# Patient Record
Sex: Female | Born: 1975 | Race: Black or African American | Hispanic: No | Marital: Single | Smoking: Never smoker
Health system: Southern US, Community
[De-identification: ages and names within clinical notes are randomized; demographics above are authoritative.]

## PROBLEM LIST (undated history)

## (undated) DIAGNOSIS — E119 Type 2 diabetes mellitus without complications: Secondary | ICD-10-CM

## (undated) DIAGNOSIS — F419 Anxiety disorder, unspecified: Secondary | ICD-10-CM

## (undated) DIAGNOSIS — G4700 Insomnia, unspecified: Secondary | ICD-10-CM

## (undated) DIAGNOSIS — G43909 Migraine, unspecified, not intractable, without status migrainosus: Secondary | ICD-10-CM

## (undated) DIAGNOSIS — F32A Depression, unspecified: Secondary | ICD-10-CM

## (undated) DIAGNOSIS — K219 Gastro-esophageal reflux disease without esophagitis: Secondary | ICD-10-CM

## (undated) DIAGNOSIS — R569 Unspecified convulsions: Secondary | ICD-10-CM

## (undated) DIAGNOSIS — E785 Hyperlipidemia, unspecified: Secondary | ICD-10-CM

## (undated) HISTORY — DX: Depression, unspecified: F32.A

## (undated) HISTORY — DX: Migraine, unspecified, not intractable, without status migrainosus: G43.909

## (undated) HISTORY — DX: Anxiety disorder, unspecified: F41.9

## (undated) HISTORY — DX: Unspecified convulsions: R56.9

## (undated) HISTORY — PX: BREAST SURGERY: SHX581

## (undated) HISTORY — DX: Hyperlipidemia, unspecified: E78.5

## (undated) HISTORY — DX: Insomnia, unspecified: G47.00

## (undated) HISTORY — DX: Gastro-esophageal reflux disease without esophagitis: K21.9

## (undated) HISTORY — PX: BREAST BIOPSY: SHX20

---

## 2004-08-15 ENCOUNTER — Emergency Department: Payer: Self-pay | Admitting: Emergency Medicine

## 2005-07-06 ENCOUNTER — Emergency Department: Payer: Self-pay | Admitting: Emergency Medicine

## 2006-05-20 ENCOUNTER — Emergency Department: Payer: Self-pay | Admitting: Emergency Medicine

## 2006-06-24 ENCOUNTER — Emergency Department: Payer: Self-pay | Admitting: General Practice

## 2006-09-08 ENCOUNTER — Emergency Department: Payer: Self-pay | Admitting: Emergency Medicine

## 2007-01-17 ENCOUNTER — Emergency Department: Payer: Self-pay | Admitting: Emergency Medicine

## 2007-01-17 ENCOUNTER — Other Ambulatory Visit: Payer: Self-pay

## 2007-02-21 ENCOUNTER — Emergency Department: Payer: Self-pay | Admitting: Emergency Medicine

## 2007-12-15 ENCOUNTER — Emergency Department: Payer: Self-pay | Admitting: Emergency Medicine

## 2008-10-08 ENCOUNTER — Emergency Department: Payer: Self-pay | Admitting: Emergency Medicine

## 2009-04-28 ENCOUNTER — Emergency Department: Payer: Self-pay | Admitting: Emergency Medicine

## 2009-08-18 ENCOUNTER — Emergency Department: Payer: Self-pay | Admitting: Unknown Physician Specialty

## 2010-10-13 ENCOUNTER — Emergency Department: Payer: Self-pay | Admitting: Emergency Medicine

## 2011-09-09 ENCOUNTER — Emergency Department: Payer: Self-pay | Admitting: Emergency Medicine

## 2011-09-13 ENCOUNTER — Emergency Department: Payer: Self-pay | Admitting: *Deleted

## 2011-09-13 LAB — CBC WITH DIFFERENTIAL/PLATELET
Basophil #: 0.1 10*3/uL (ref 0.0–0.1)
Basophil %: 0.4 %
Eosinophil #: 0.1 10*3/uL (ref 0.0–0.7)
HCT: 41.3 % (ref 35.0–47.0)
HGB: 13.6 g/dL (ref 12.0–16.0)
Lymphocyte #: 3.3 10*3/uL (ref 1.0–3.6)
MCHC: 32.9 g/dL (ref 32.0–36.0)
MCV: 85 fL (ref 80–100)
Monocyte #: 1 10*3/uL — ABNORMAL HIGH (ref 0.0–0.7)
Monocyte %: 7.7 %
Neutrophil #: 8.1 10*3/uL — ABNORMAL HIGH (ref 1.4–6.5)
Platelet: 332 10*3/uL (ref 150–440)
RDW: 13.9 % (ref 11.5–14.5)
WBC: 12.6 10*3/uL — ABNORMAL HIGH (ref 3.6–11.0)

## 2011-09-13 LAB — URINALYSIS, COMPLETE
Blood: NEGATIVE
Glucose,UR: NEGATIVE mg/dL (ref 0–75)
Nitrite: NEGATIVE
Ph: 5 (ref 4.5–8.0)
Protein: NEGATIVE
RBC,UR: 1 /HPF (ref 0–5)
Specific Gravity: 1.028 (ref 1.003–1.030)
Squamous Epithelial: 2
WBC UR: 1 /HPF (ref 0–5)

## 2011-09-13 LAB — BASIC METABOLIC PANEL
Anion Gap: 12 (ref 7–16)
BUN: 11 mg/dL (ref 7–18)
Calcium, Total: 9.4 mg/dL (ref 8.5–10.1)
EGFR (African American): >60
EGFR (Non-African Amer.): >60
Glucose: 100 mg/dL — ABNORMAL HIGH (ref 65–99)
Osmolality: 283 (ref 275–301)
Potassium: 3.8 mmol/L (ref 3.5–5.1)

## 2011-09-14 LAB — RAPID INFLUENZA A&B ANTIGENS

## 2012-10-03 ENCOUNTER — Emergency Department: Payer: Self-pay | Admitting: Emergency Medicine

## 2014-04-01 ENCOUNTER — Emergency Department: Payer: Self-pay | Admitting: Emergency Medicine

## 2016-01-06 ENCOUNTER — Encounter: Payer: Self-pay | Admitting: Emergency Medicine

## 2016-01-06 ENCOUNTER — Emergency Department
Admission: EM | Admit: 2016-01-06 | Discharge: 2016-01-06 | Disposition: A | Discharge status: Home / Self Care | Payer: 59 | Attending: Emergency Medicine | Admitting: Emergency Medicine

## 2016-01-06 DIAGNOSIS — T161XXA Foreign body in right ear, initial encounter: Secondary | ICD-10-CM | POA: Diagnosis present

## 2016-01-06 DIAGNOSIS — Y939 Activity, unspecified: Secondary | ICD-10-CM | POA: Insufficient documentation

## 2016-01-06 DIAGNOSIS — Z711 Person with feared health complaint in whom no diagnosis is made: Secondary | ICD-10-CM | POA: Diagnosis not present

## 2016-01-06 DIAGNOSIS — X58XXXA Exposure to other specified factors, initial encounter: Secondary | ICD-10-CM | POA: Diagnosis not present

## 2016-01-06 DIAGNOSIS — Y999 Unspecified external cause status: Secondary | ICD-10-CM | POA: Insufficient documentation

## 2016-01-06 DIAGNOSIS — Y929 Unspecified place or not applicable: Secondary | ICD-10-CM | POA: Diagnosis not present

## 2016-01-06 DIAGNOSIS — H6121 Impacted cerumen, right ear: Secondary | ICD-10-CM | POA: Diagnosis not present

## 2016-01-06 NOTE — Discharge Instructions (Signed)
Follow-up with [facility] if any continued problem is with her ear or go to [facility] for further evaluation.

## 2016-01-06 NOTE — ED Notes (Signed)
Pt arrived to the ED for complaints of a foreign body in the right ear. Pt states that she feels something moving in her ear. Pt is AOx4 in no apparent distress.  

## 2016-01-06 NOTE — ED Notes (Signed)
States she feels something in right ear since last pm. denies any pain

## 2016-01-06 NOTE — ED Provider Notes (Signed)
[facility] Emergency Department Provider Note   ____________________________________________  Time seen: Approximately 7:23 AM  I have reviewed the triage vital signs and the nursing notes.   HISTORY  Chief Complaint Foreign Body in Ear   HPI Maria Little is a 40 y.o. female is here with complaint of foreign body in her ear. Patient states she felt something moving around in her right ear since approximately 4 AM. She denies any fever, chills, dizziness. She has not put any drops in her ear.   History reviewed. No pertinent past medical history.  There are no active problems to display for this patient.   History reviewed. No pertinent past surgical history.  No current outpatient prescriptions on file.  Allergies Review of patient's allergies indicates no known allergies.  History reviewed. No pertinent family history.  Social History Social History  Substance Use Topics  . Smoking status: Never Smoker   . Smokeless tobacco: None  . Alcohol Use: Yes    Review of Systems Constitutional: No fever/chills Eyes: No visual changes. ENT: Positive for right ear canal discomfort Cardiovascular: Denies chest pain. Respiratory: Denies shortness of breath. Skin: Negative for rash. Neurological: Negative for headaches, focal weakness or numbness.  10-point ROS otherwise negative.  ____________________________________________   PHYSICAL EXAM:  VITAL SIGNS: ED Triage Vitals  Enc Vitals Group     BP 01/06/16 0504 120/92 mmHg     Pulse Rate 01/06/16 0504 86     Resp 01/06/16 0504 18     Temp 01/06/16 0504 98.3 F (36.8 C)     Temp Source 01/06/16 0504 Oral     SpO2 01/06/16 0504 100 %     Weight 01/06/16 0504 208 lb (94.348 kg)     Height 01/06/16 0504  (1.651 m)     Head Cir --      Peak Flow --      Pain Score 01/06/16 0505 0     Pain Loc --      Pain Edu? --      Excl. in GC? --     Constitutional: Alert and  oriented. Well appearing and in no acute distress. Eyes: Conjunctivae are normal. PERRL. EOMI. Head: Atraumatic. Nose: No congestion/rhinnorhea.   Left EAC and TM are clear. Right EAC with minimal amount of cerumen present without foreign body or any "bugs". Mouth/Throat: Mucous membranes are moist.  Oropharynx non-erythematous. Neck: No stridor.   Hematological/Lymphatic/Immunilogical: No cervical lymphadenopathy. Cardiovascular: Normal rate, regular rhythm. Grossly normal heart sounds.  Good peripheral circulation. Respiratory: Normal respiratory effort.  No retractions. Lungs CTAB. Musculoskeletal: Moves upper and lower extremities without difficulty. Normal gait was noted. Neurologic:  Normal speech and language.  Skin:  Skin is warm, dry and intact. No rash noted. Psychiatric: Mood and affect are normal. Speech and behavior are normal.  ____________________________________________   LABS (all labs ordered are listed, but only abnormal results are displayed)  Labs Reviewed - No data to display  PROCEDURES  Procedure(s) performed: None  Critical Care performed: No  ____________________________________________   INITIAL IMPRESSION / ASSESSMENT AND PLAN / ED COURSE  Pertinent labs & imaging results that were available during my care of the patient were reviewed by me and considered in my medical decision making (see chart for details).  Patient was discharged without any medication. She was reassured that there was not as insect of any description in her ear. Patient is here was lavaged with normal saline by the RN. ____________________________________________  FINAL CLINICAL IMPRESSION(S) / ED DIAGNOSES  Final diagnoses:  Excessive cerumen in right ear canal  Feared complaint without diagnosis      NEW MEDICATIONS STARTED DURING THIS VISIT:  There are no discharge medications for this patient.    Note:  This document was prepared using Dragon voice recognition  software and may include unintentional dictation errors.    Tommi Rumpshonda L Aizlyn Schifano, PA-C 01/06/16 [phone]

## 2016-08-07 ENCOUNTER — Encounter: Payer: Self-pay | Admitting: Emergency Medicine

## 2016-08-07 ENCOUNTER — Emergency Department
Admission: EM | Admit: 2016-08-07 | Discharge: 2016-08-07 | Disposition: A | Discharge status: Home / Self Care | Payer: 59 | Attending: Emergency Medicine | Admitting: Emergency Medicine

## 2016-08-07 DIAGNOSIS — R21 Rash and other nonspecific skin eruption: Secondary | ICD-10-CM | POA: Diagnosis not present

## 2016-08-07 MED ORDER — HYDROXYZINE HCL 25 MG PO TABS
25.0000 mg | ORAL_TABLET | Freq: Four times a day (QID) | ORAL | 0 refills | Status: DC | PRN
Start: 2016-08-07 — End: 2016-11-08

## 2016-08-07 MED ORDER — METHYLPREDNISOLONE 4 MG PO TBPK: ORAL_TABLET | ORAL | 0 refills | Status: DC

## 2016-08-07 MED ORDER — PREDNISONE 20 MG PO TABS
ORAL_TABLET | ORAL | Status: AC
  Administered 2016-08-07: 40 mg via ORAL
  Filled 2016-08-07: qty 2

## 2016-08-07 MED ORDER — HYDROXYZINE HCL 25 MG PO TABS
ORAL_TABLET | ORAL | Status: AC
  Administered 2016-08-07: 50 mg via ORAL
  Filled 2016-08-07: qty 2

## 2016-08-07 MED ORDER — HYDROXYZINE HCL 25 MG PO TABS
50.0000 mg | ORAL_TABLET | Freq: Once | ORAL | Status: AC
  Administered 2016-08-07: 50 mg via ORAL

## 2016-08-07 MED ORDER — PREDNISONE 20 MG PO TABS
40.0000 mg | ORAL_TABLET | Freq: Once | ORAL | Status: AC
  Administered 2016-08-07: 40 mg via ORAL

## 2016-08-07 NOTE — Discharge Instructions (Signed)
1. Take steroid taper as prescribed (Medrol Dosepak). 2. You may take Atarax as needed for itching. Do not take Benadryl if you are taking Atarax. 3. Return to the ER for worsening symptoms, persistent vomiting, difficulty breathing or other concerns.

## 2016-08-07 NOTE — ED Notes (Signed)

## 2016-08-07 NOTE — ED Provider Notes (Signed)
[facility] Emergency Department Provider Note   ____________________________________________   First MD Initiated Contact with Patient 08/07/16 [phone]     (approximate)  I have reviewed the triage vital signs and the nursing notes.   HISTORY  Chief Complaint Rash    HPI Maria Little is a 41 y.o. female who presents to the ED from home with a chief complain of rash to bilateral arms. Patient reports rash to bilateral arms 2 weeks ago which resolved with OTC creams. She thought that she had bedbugs so she has been spraying Lysol on her mattress and washing her bed sheets in hot water. This week she noted rash to her arms again. Describes itchy rash not associated with tongue or facial swelling, airway difficulty or shortness of breath. Denies new exposures such as medicines, antibiotics, new foods, lotions, detergents. Denies recent fever, chills, chest pain, shortness of breath, abdominal pain, nausea, vomiting, diarrhea. Denies recent travel or trauma. Nothing makes her symptoms better or worse.   Past Medical History None  There are no active problems to display for this patient.   Past Surgical History:  Procedure Laterality Date  . BREAST SURGERY     Lumpectomy    Prior to Admission medications   Medication Sig Start Date End Date Taking? Authorizing Provider  hydrOXYzine (ATARAX/VISTARIL) 25 MG tablet Take 1 tablet (25 mg total) by mouth every 6 (six) hours as needed for itching. 08/07/16   Irean HongJade J Sung, MD  methylPREDNISolone (MEDROL DOSEPAK) 4 MG TBPK tablet Take as directed 08/07/16   Irean HongJade J Sung, MD    Allergies Patient has no known allergies.  No family history on file.  Social History Social History  Substance Use Topics  . Smoking status: Never Smoker  . Smokeless tobacco: Never Used  . Alcohol use Yes    Review of Systems  Constitutional: No fever/chills. Eyes: No visual changes. ENT: No sore throat. Cardiovascular:  Denies chest pain. Respiratory: Denies shortness of breath. Gastrointestinal: No abdominal pain.  No nausea, no vomiting.  No diarrhea.  No constipation. Genitourinary: Negative for dysuria. Musculoskeletal: Negative for back pain. Skin: Positive for rash. Neurological: Negative for headaches, focal weakness or numbness.  10-point ROS otherwise negative.  ____________________________________________   PHYSICAL EXAM:  VITAL SIGNS: ED Triage Vitals  Enc Vitals Group     BP 08/07/16 0049 (!) 153/100     Pulse Rate 08/07/16 0049 (!) 104     Resp 08/07/16 0049 18     Temp 08/07/16 0049 98.7 F (37.1 C)     Temp Source 08/07/16 0049 Oral     SpO2 08/07/16 0049 100 %     Weight 08/07/16 0049 213 lb (96.6 kg)     Height 08/07/16 0049 5\' 5"  (1.651 m)     Head Circumference --      Peak Flow --      Pain Score 08/07/16 0101 0     Pain Loc --      Pain Edu? --      Excl. in GC? --     Constitutional: Alert and oriented. Well appearing and in no acute distress. Eyes: Conjunctivae are normal. PERRL. EOMI. Head: Atraumatic. Nose: No congestion/rhinnorhea. Mouth/Throat: Mucous membranes are moist.  Oropharynx non-erythematous. Neck: No stridor.   Cardiovascular: Normal rate, regular rhythm. Grossly normal heart sounds.  Good peripheral circulation. Respiratory: Normal respiratory effort.  No retractions. Lungs CTAB. Gastrointestinal: Soft and nontender. No distention. No abdominal bruits. No CVA tenderness. Musculoskeletal:  No lower extremity tenderness nor edema.  No joint effusions. Neurologic:  Normal speech and language. No gross focal neurologic deficits are appreciated. No gait instability. Skin:  Skin is warm, dry and intact. Scattered raised bumps along bilateral upper arms without erythema, warmth or fluctuance. No rash elsewhere. No petechiae. Psychiatric: Mood and affect are normal. Speech and behavior are normal.  ____________________________________________    LABS (all labs ordered are listed, but only abnormal results are displayed)  Labs Reviewed - No data to display ____________________________________________  EKG  None ____________________________________________  RADIOLOGY  None ____________________________________________   PROCEDURES  Procedure(s) performed: None  Procedures  Critical Care performed: No  ____________________________________________   INITIAL IMPRESSION / ASSESSMENT AND PLAN / ED COURSE  Pertinent labs & imaging results that were available during my care of the patient were reviewed by me and considered in my medical decision making (see chart for details).  41 year old female who presents with rash to bilateral arms. Characteristics and distribution are not consistent with scabies or bedbugs. Likely insect bites. Will place on Medrol Dosepak, Atarax as needed for itching and dermatology follow-up. Strict return precautions given. Patient verbalizes understanding and agrees with plan of care.      ____________________________________________   FINAL CLINICAL IMPRESSION(S) / ED DIAGNOSES  Final diagnoses:  Rash      NEW MEDICATIONS STARTED DURING THIS VISIT:  Discharge Medication List as of 08/07/2016  5:14 AM    START taking these medications   Details  hydrOXYzine (ATARAX/VISTARIL) 25 MG tablet Take 1 tablet (25 mg total) by mouth every 6 (six) hours as needed for itching., Starting Sat 08/07/2016, Print    methylPREDNISolone (MEDROL DOSEPAK) 4 MG TBPK tablet Take as directed, Print         Note:  This document was prepared using Dragon voice recognition software and may include unintentional dictation errors.    Irean Hong, MD 08/07/16 [phone]

## 2016-08-07 NOTE — ED Triage Notes (Signed)
Pt arrives ambulatory to triage with c/o rash on both arms that is growing and itching. Pt states that she has already checked her house for bed bugs at this time.

## 2016-10-05 ENCOUNTER — Encounter: Payer: Self-pay | Admitting: Family Medicine

## 2016-10-05 ENCOUNTER — Ambulatory Visit (INDEPENDENT_AMBULATORY_CARE_PROVIDER_SITE_OTHER): Payer: 59 | Admitting: Family Medicine

## 2016-10-05 VITALS — BP 135/91 | HR 86 | Temp 98.7°F | Resp 17 | Ht 64.25 in | Wt 205.0 lb

## 2016-10-05 DIAGNOSIS — G44229 Chronic tension-type headache, not intractable: Secondary | ICD-10-CM

## 2016-10-05 DIAGNOSIS — Z13220 Encounter for screening for lipoid disorders: Secondary | ICD-10-CM

## 2016-10-05 DIAGNOSIS — R1310 Dysphagia, unspecified: Secondary | ICD-10-CM

## 2016-10-05 DIAGNOSIS — K219 Gastro-esophageal reflux disease without esophagitis: Secondary | ICD-10-CM | POA: Insufficient documentation

## 2016-10-05 DIAGNOSIS — F320 Major depressive disorder, single episode, mild: Secondary | ICD-10-CM | POA: Diagnosis not present

## 2016-10-05 DIAGNOSIS — F331 Major depressive disorder, recurrent, moderate: Secondary | ICD-10-CM | POA: Insufficient documentation

## 2016-10-05 MED ORDER — FLUOXETINE HCL 10 MG PO CAPS: 10.0000 mg | ORAL_CAPSULE | Freq: Every day | ORAL | 3 refills | Status: DC

## 2016-10-05 MED ORDER — OMEPRAZOLE 20 MG PO CPDR: 20.0000 mg | DELAYED_RELEASE_CAPSULE | Freq: Every day | ORAL | 3 refills | Status: DC

## 2016-10-05 NOTE — Patient Instructions (Addendum)
Insomnia Insomnia is a sleep disorder that makes it difficult to fall asleep or to stay asleep. Insomnia can cause tiredness (fatigue), low energy, difficulty concentrating, mood swings, and poor performance at work or school. There are three different ways to classify insomnia:  Difficulty falling asleep.  Difficulty staying asleep.  Waking up too early in the morning. Any type of insomnia can be long-term (chronic) or short-term (acute). Both are common. Short-term insomnia usually lasts for three months or less. Chronic insomnia occurs at least three times a week for longer than three months. What are the causes? Insomnia may be caused by another condition, situation, or substance, such as:  Anxiety.  Certain medicines.  Gastroesophageal reflux disease (GERD) or other gastrointestinal conditions.  Asthma or other breathing conditions.  Restless legs syndrome, sleep apnea, or other sleep disorders.  Chronic pain.  Menopause. This may include hot flashes.  Stroke.  Abuse of alcohol, tobacco, or illegal drugs.  Depression.  Caffeine.  Neurological disorders, such as Alzheimer disease.  An overactive thyroid (hyperthyroidism). The cause of insomnia may not be known. What increases the risk? Risk factors for insomnia include:  Gender. Women are more commonly affected than men.  Age. Insomnia is more common as you get older.  Stress. This may involve your professional or personal life.  Income. Insomnia is more common in people with lower income.  Lack of exercise.  Irregular work schedule or night shifts.  Traveling between different time zones. What are the signs or symptoms? If you have insomnia, trouble falling asleep or trouble staying asleep is the main symptom. This may lead to other symptoms, such as:  Feeling fatigued.  Feeling nervous about going to sleep.  Not feeling rested in the morning.  Having trouble concentrating.  Feeling irritable,  anxious, or depressed. How is this treated? Treatment for insomnia depends on the cause. If your insomnia is caused by an underlying condition, treatment will focus on addressing the condition. Treatment may also include:  Medicines to help you sleep.  Counseling or therapy.  Lifestyle adjustments. Follow these instructions at home:  Take medicines only as directed by your health care provider.  Keep regular sleeping and waking hours. Avoid naps.  Keep a sleep diary to help you and your health care provider figure out what could be causing your insomnia. Include:  When you sleep.  When you wake up during the night.  How well you sleep.  How rested you feel the next day.  Any side effects of medicines you are taking.  What you eat and drink.  Make your bedroom a comfortable place where it is easy to fall asleep:  Put up shades or special blackout curtains to block light from outside.  Use a white noise machine to block noise.  Keep the temperature cool.  Exercise regularly as directed by your health care provider. Avoid exercising right before bedtime.  Use relaxation techniques to manage stress. Ask your health care provider to suggest some techniques that may work well for you. These may include:  Breathing exercises.  Routines to release muscle tension.  Visualizing peaceful scenes.  Cut back on alcohol, caffeinated beverages, and cigarettes, especially close to bedtime. These can disrupt your sleep.  Do not overeat or eat spicy foods right before bedtime. This can lead to digestive discomfort that can make it hard for you to sleep.  Limit screen use before bedtime. This includes:  Watching TV.  Using your smartphone, tablet, and computer.  Stick to a   routine. This can help you fall asleep faster. Try to do a quiet activity, brush your teeth, and go to bed at the same time each night.  Get out of bed if you are still awake after 15 minutes of trying to  sleep. Keep the lights down, but try reading or doing a quiet activity. When you feel sleepy, go back to bed.  Make sure that you drive carefully. Avoid driving if you feel very sleepy.  Keep all follow-up appointments as directed by your health care provider. This is important. Contact a health care provider if:  You are tired throughout the day or have trouble in your daily routine due to sleepiness.  You continue to have sleep problems or your sleep problems get worse. Get help right away if:  You have serious thoughts about hurting yourself or someone else. This information is not intended to replace advice given to you by your health care provider. Make sure you discuss any questions you have with your health care provider. Document Released: 07/02/2000 Document Revised: 12/05/2015 Document Reviewed: 04/05/2014 Elsevier Interactive Patient Education  2017 [employer] Fluoxetine capsules or tablets (Depression/Mood Disorders) What is this medicine? FLUOXETINE (floo OX e teen) belongs to a class of drugs known as selective serotonin reuptake inhibitors (SSRIs). It helps to treat mood problems such as depression, obsessive compulsive disorder, and panic attacks. It can also treat certain eating disorders. This medicine may be used for other purposes; ask your health care provider or pharmacist if you have questions. COMMON BRAND NAME(S): Prozac What should I tell my health care provider before I take this medicine? They need to know if you have any of these conditions: -bipolar disorder or a family history of bipolar disorder -bleeding disorders -glaucoma -heart disease -liver disease -low levels of sodium in the blood -seizures -suicidal thoughts, plans, or attempt; a previous suicide attempt by you or a family member -take MAOIs like Carbex, Eldepryl, Marplan, Nardil, and Parnate -take medicines that treat or prevent blood clots -thyroid disease -an unusual or allergic  reaction to fluoxetine, other medicines, foods, dyes, or preservatives -pregnant or trying to get pregnant -breast-feeding How should I use this medicine? Take this medicine by mouth with a glass of water. Follow the directions on the prescription label. You can take this medicine with or without food. Take your medicine at regular intervals. Do not take it more often than directed. Do not stop taking this medicine suddenly except upon the advice of your doctor. Stopping this medicine too quickly may cause serious side effects or your condition may worsen. A special MedGuide will be given to you by the pharmacist with each prescription and refill. Be sure to read this information carefully each time. Talk to your pediatrician regarding the use of this medicine in children. While this drug may be prescribed for children as young as 7 years for selected conditions, precautions do apply. Overdosage: If you think you have taken too much of this medicine contact a poison control center or emergency room at once. NOTE: This medicine is only for you. Do not share this medicine with others. What if I miss a dose? If you miss a dose, skip the missed dose and go back to your regular dosing schedule. Do not take double or extra doses. What may interact with this medicine? Do not take this medicine with any of the following medications: -other medicines containing fluoxetine, like Sarafem or Symbyax -cisapride -linezolid -MAOIs like Carbex, Eldepryl, Marplan, Nardil, and Parnate -  methylene blue (injected into a vein) -pimozide -thioridazine This medicine may also interact with the following medications: -alcohol -amphetamines -aspirin and aspirin-like medicines -carbamazepine -certain medicines for depression, anxiety, or psychotic disturbances -certain medicines for migraine headaches like almotriptan, eletriptan, frovatriptan, naratriptan, rizatriptan, sumatriptan,  zolmitriptan -digoxin -diuretics -fentanyl -flecainide -furazolidone -isoniazid -lithium -medicines for sleep -medicines that treat or prevent blood clots like warfarin, enoxaparin, and dalteparin -NSAIDs, medicines for pain and inflammation, like ibuprofen or naproxen -phenytoin -procarbazine -propafenone -rasagiline -ritonavir -supplements like St. John's wort, kava kava, valerian -tramadol -tryptophan -vinblastine This list may not describe all possible interactions. Give your health care provider a list of all the medicines, herbs, non-prescription drugs, or dietary supplements you use. Also tell them if you smoke, drink alcohol, or use illegal drugs. Some items may interact with your medicine. What should I watch for while using this medicine? Tell your doctor if your symptoms do not get better or if they get worse. Visit your doctor or health care professional for regular checks on your progress. Because it may take several weeks to see the full effects of this medicine, it is important to continue your treatment as prescribed by your doctor. Patients and their families should watch out for new or worsening thoughts of suicide or depression. Also watch out for sudden changes in feelings such as feeling anxious, agitated, panicky, irritable, hostile, aggressive, impulsive, severely restless, overly excited and hyperactive, or not being able to sleep. If this happens, especially at the beginning of treatment or after a change in dose, call your health care professional. Bonita Quin may get drowsy or dizzy. Do not drive, use machinery, or do anything that needs mental alertness until you know how this medicine affects you. Do not stand or sit up quickly, especially if you are an older patient. This reduces the risk of dizzy or fainting spells. Alcohol may interfere with the effect of this medicine. Avoid alcoholic drinks. Your mouth may get dry. Chewing sugarless gum or sucking hard candy, and  drinking plenty of water may help. Contact your doctor if the problem does not go away or is severe. This medicine may affect blood sugar levels. If you have diabetes, check with your doctor or health care professional before you change your diet or the dose of your diabetic medicine. What side effects may I notice from receiving this medicine? Side effects that you should report to your doctor or health care professional as soon as possible: -allergic reactions like skin rash, itching or hives, swelling of the face, lips, or tongue -anxious -black, tarry stools -breathing problems -changes in vision -confusion -elevated mood, decreased need for sleep, racing thoughts, impulsive behavior -eye pain -fast, irregular heartbeat -feeling faint or lightheaded, falls -feeling agitated, angry, or irritable -hallucination, loss of contact with reality -loss of balance or coordination -loss of memory -painful or prolonged erections -restlessness, pacing, inability to keep still -seizures -stiff muscles -suicidal thoughts or other mood changes -trouble sleeping -unusual bleeding or bruising -unusually weak or tired -vomiting Side effects that usually do not require medical attention (report to your doctor or health care professional if they continue or are bothersome): -change in appetite or weight -change in sex drive or performance -diarrhea -dry mouth -headache -increased sweating -nausea -tremors This list may not describe all possible side effects. Call your doctor for medical advice about side effects. You may report side effects to FDA at 1-800-FDA-1088. Where should I keep my medicine? Keep out of the reach of children. Store at room  temperature between 15 and 30 degrees C (59 and 86 degrees F). Throw away any unused medicine after the expiration date. NOTE: This sheet is a summary. It may not cover all possible information. If you have questions about this medicine, talk to your  doctor, pharmacist, or health care provider.  2018 Elsevier/Gold Standard (2015-12-06 15:55:27)  Food Choices for Gastroesophageal Reflux Disease, Adult When you have gastroesophageal reflux disease (GERD), the foods you eat and your eating habits are very important. Choosing the right foods can help ease your discomfort. What guidelines do I need to follow?  Choose fruits, vegetables, whole grains, and low-fat dairy products.  Choose low-fat meat, fish, and poultry.  Limit fats such as oils, salad dressings, butter, nuts, and avocado.  Keep a food diary. This helps you identify foods that cause symptoms.  Avoid foods that cause symptoms. These may be different for everyone.  Eat small meals often instead of 3 large meals a day.  Eat your meals slowly, in a place where you are relaxed.  Limit fried foods.  Cook foods using methods other than frying.  Avoid drinking alcohol.  Avoid drinking large amounts of liquids with your meals.  Avoid bending over or lying down until 2-3 hours after eating. What foods are not recommended? These are some foods and drinks that may make your symptoms worse: Vegetables  Tomatoes. Tomato juice. Tomato and spaghetti sauce. Chili peppers. Onion and garlic. Horseradish. Fruits  Oranges, grapefruit, and lemon (fruit and juice). Meats  High-fat meats, fish, and poultry. This includes hot dogs, ribs, ham, sausage, salami, and bacon. Dairy  Whole milk and chocolate milk. Sour cream. Cream. Butter. Ice cream. Cream cheese. Drinks  Coffee and tea. Bubbly (carbonated) drinks or energy drinks. Condiments  Hot sauce. Barbecue sauce. Sweets/Desserts  Chocolate and cocoa. Donuts. Peppermint and spearmint. Fats and Oils  High-fat foods. This includes [place] fries and potato chips. Other  Vinegar. Strong spices. This includes black pepper, white pepper, red pepper, cayenne, curry powder, cloves, ginger, and chili powder. The items listed above may  not be a complete list of foods and drinks to avoid. Contact your dietitian for more information.  This information is not intended to replace advice given to you by your health care provider. Make sure you discuss any questions you have with your health care provider. Document Released: 01/04/2012 Document Revised: 12/11/2015 Document Reviewed: 05/09/2013 Elsevier Interactive Patient Education  2017 [employer].

## 2016-10-05 NOTE — Progress Notes (Signed)
BP (!) 135/91 (BP Location: Right Arm, Patient Position: Sitting, Cuff Size: Normal)   Pulse 86   Temp 98.7 F (37.1 C) (Oral)   Resp 17   Ht 5' 4.25" (1.632 m)   Wt 205 lb (93 kg)   LMP 08/03/2016 Comment: On depo provera  SpO2 100%   BMI 34.91 kg/m    Subjective:    Patient ID: Maria Little, female    DOB: Nov 13, 1975, 41 y.o.   MRN: [id number]  HPI: Maria Little is a 41 y.o. female who presents today to establish care she has not seen a doctor ever.   Chief Complaint  Patient presents with  . Establish Care   DEPRESSION Duration: Over 5 years Mood status: uncontrolled Satisfied with current treatment?: Not on anything Symptom severity: moderate  Duration of current treatment : Not on anything Depressed mood: yes Anxious mood: no Anhedonia: no Significant weight loss or gain: no Insomnia: yes both hard to fall asleep and stay asleep Fatigue: yes Feelings of worthlessness or guilt: no Impaired concentration/indecisiveness: no Suicidal ideations: no Hopelessness: no Crying spells: no Depression screen PHQ 2/9 10/05/2016  Decreased Interest 0  Down, Depressed, Hopeless 0  PHQ - 2 Score 0  Altered sleeping 3  Tired, decreased energy 3  Change in appetite 3  Feeling bad or failure about yourself  0  Trouble concentrating 0  Moving slowly or fidgety/restless 1  Suicidal thoughts 0  PHQ-9 Score 10    GERD- for about 2 months GERD control status: uncontrolled  Satisfied with current treatment? no Heartburn frequency: 1x every 2 weeks Medication side effects: not on anything  Previous GERD medications: tums, rolaids Antacid use frequency:  1x every 2 weeks Duration: hours to days Nature: burning, stuck Location: throat Heartburn duration: 2 months Alleviatiating factors: foods Aggravating factors: tums/rolaids Dysphagia: yes- just food Odynophagia:  no Hematemesis: no Blood in stool: no EGD: no  MIGRAINES Duration: chronic Onset:  gradual Severity: mild sometimes up to severe occasionally Quality: "pain" Frequency: intermittent Location: in the middle of the back of her head Headache duration: depends- usually hours to days Radiation: no Time of day headache occurs: random Alleviating factors: medicine- advil PM Aggravating factors: cheese, stress Headache status at time of visit: asymptomatic Treatments attempted: BC powders, rest and ibuprofen   Aura: no Nausea:  no Vomiting: no Photophobia:  yes Phonophobia:  yes Effect on social functioning:  no Confusion:  no Gait disturbance/ataxia:  no Behavioral changes:  no Fevers:  no  Active Ambulatory Problems    Diagnosis Date Noted  . Depression, major, single episode, complete remission ([place]) 10/05/2016  . Gastroesophageal reflux disease 10/05/2016   Resolved Ambulatory Problems    Diagnosis Date Noted  . No Resolved Ambulatory Problems   Past Medical History:  Diagnosis Date  . Migraine    Past Surgical History:  Procedure Laterality Date  . BREAST SURGERY     Lumpectomy   Outpatient Encounter Prescriptions as of 10/05/2016  Medication Sig  . naproxen sodium (ANAPROX) 220 MG tablet Take 220 mg by mouth 2 (two) times daily with a meal.  . FLUoxetine (PROZAC) 10 MG capsule Take 1 capsule (10 mg total) by mouth daily.  . hydrOXYzine (ATARAX/VISTARIL) 25 MG tablet Take 1 tablet (25 mg total) by mouth every 6 (six) hours as needed for itching. (Patient not taking: Reported on 10/05/2016)  . omeprazole (PRILOSEC) 20 MG capsule Take 1 capsule (20 mg total) by mouth daily.  . [DISCONTINUED] methylPREDNISolone (  MEDROL DOSEPAK) 4 MG TBPK tablet Take as directed   No facility-administered encounter medications on file as of 10/05/2016.    No Known Allergies Family History  Problem Relation Age of Onset  . Diabetes Mother   . Hyperlipidemia Mother   . Hypertension Mother   . Hypertension Father   . Hypertension Brother    Social History   Social  History  . Marital status: Single    Spouse name: N/A  . Number of children: N/A  . Years of education: N/A   Social History Main Topics  . Smoking status: Never Smoker  . Smokeless tobacco: Never Used  . Alcohol use Yes  . Drug use: No  . Sexual activity: No   Other Topics Concern  . None   Social History Narrative  . None   Review of Systems  Constitutional: Negative.   Respiratory: Negative.   Cardiovascular: Negative.   Gastrointestinal: Negative for abdominal distention, abdominal pain, anal bleeding, blood in stool, constipation, diarrhea, nausea, rectal pain and vomiting.  Psychiatric/Behavioral: Positive for dysphoric mood and sleep disturbance. Negative for agitation, behavioral problems, confusion, decreased concentration, hallucinations, self-injury and suicidal ideas. The patient is not nervous/anxious and is not hyperactive.     Per HPI unless specifically indicated above     Objective:    BP (!) 135/91 (BP Location: Right Arm, Patient Position: Sitting, Cuff Size: Normal)   Pulse 86   Temp 98.7 F (37.1 C) (Oral)   Resp 17   Ht 5' 4.25" (1.632 m)   Wt 205 lb (93 kg)   LMP 08/03/2016 Comment: On depo provera  SpO2 100%   BMI 34.91 kg/m   Wt Readings from Last 3 Encounters:  10/05/16 205 lb (93 kg)  08/07/16 213 lb (96.6 kg)  01/06/16 208 lb (94.3 kg)    Physical Exam  Constitutional: She is oriented to person, place, and time. She appears well-developed and well-nourished. No distress.  HENT:  Head: Normocephalic and atraumatic.  Right Ear: Hearing normal.  Left Ear: Hearing normal.  Nose: Nose normal.  Eyes: Conjunctivae and lids are normal. Right eye exhibits no discharge. Left eye exhibits no discharge. No scleral icterus.  Cardiovascular: Normal rate, regular rhythm, normal heart sounds and intact distal pulses.  Exam reveals no gallop and no friction rub.   No murmur heard. Pulmonary/Chest: Effort normal and breath sounds normal. No  respiratory distress. She has no wheezes. She has no rales. She exhibits no tenderness.  Musculoskeletal: Normal range of motion.  Neurological: She is alert and oriented to person, place, and time.  Skin: Skin is warm, dry and intact. No rash noted. No erythema. No pallor.  Psychiatric: She has a normal mood and affect. Her speech is normal and behavior is normal. Judgment and thought content normal. Cognition and memory are normal.  Nursing note and vitals reviewed.   Results for orders placed or performed in visit on 09/13/11  Influenza A&B Antigens [facility])  Result Value Ref Range   Micro Text Report         COMMENT                   NEGATIVE FOR INFLUENZA A AND B   ANTIBIOTIC  Basic metabolic panel  Result Value Ref Range   Glucose 100 (H) 65 - 99 mg/dL   BUN 11 7 - 18 mg/dL   Creatinine 0.79 0.60 - 1.30 mg/dL   Sodium 142 136 - 145 mmol/L   Potassium 3.8 3.5 - 5.1 mmol/L   Chloride 104 98 - 107 mmol/L   Co2 26 21 - 32 mmol/L   Calcium, Total 9.4 8.5 - 10.1 mg/dL   Osmolality 283 275 - 301   Anion Gap 12 7 - 16   EGFR (African American) >60 >28m/min   EGFR (Non-African Amer.) >60 >638mmin  CBC with Differential/Platelet  Result Value Ref Range   WBC 12.6 (H) 3.6 - 11.0 x10 3/mm 3   RBC 4.89 3.80 - 5.20 X10 6/mm 3   HGB 13.6 12.0 - 16.0 g/dL   HCT 41.3 35.0 - 47.0 %   MCV 85 80 - 100 fL   MCH 27.8 26.0 - 34.0 pg   MCHC 32.9 32.0 - 36.0 g/dL   RDW 13.9 11.5 - 14.5 %   Platelet 332 150 - 440 x10 3/mm 3   Neutrophil % 64.9 %   Lymphocyte % 26.1 %   Monocyte % 7.7 %   Eosinophil % 0.9 %   Basophil % 0.4 %   Neutrophil # 8.1 (H) 1.4 - 6.5 x10 3/mm 3   Lymphocyte # 3.3 1.0 - 3.6 x10 3/mm 3   Monocyte # 1.0 (H) 0.0 - 0.7 x10 3/mm 3   Eosinophil # 0.1 0.0 - 0.7 x10 3/mm 3   Basophil # 0.1 0.0 - 0.1 x10 3/mm 3  Urinalysis, Complete  Result Value Ref Range   Color - urine Yellow    Clarity - urine Hazy     Glucose,UR Negative 0 - 75 mg/dL   Bilirubin,UR Negative NEGATIVE   Ketone Trace NEGATIVE   Specific Gravity 1.028 1.003 - 1.030   Blood Negative NEGATIVE   Ph 5.0 4.5 - 8.0   Protein Negative NEGATIVE   Nitrite Negative NEGATIVE   Leukocyte Esterase Negative NEGATIVE   RBC,UR 1 /HPF 0 - 5 /HPF   WBC UR 1 /HPF 0 - 5 /HPF   Bacteria NONE SEEN NONE SEEN   Squamous Epithelial 2 /HPF    Mucous PRESENT   Pregnancy, urine  Result Value Ref Range   Pregnancy Test, Urine NEGATIVE mIU/mL      Assessment & Little:   Problem List Items Addressed This Visit      Digestive   Gastroesophageal reflux disease    Will start her on omeprazole for 1 month. If not improving, may need to see GI.       Relevant Medications   omeprazole (PRILOSEC) 20 MG capsule   Other Relevant Orders   Comprehensive metabolic panel   TSH   UA/M w/rflx Culture, Routine     Other   Depression, major, single episode, complete remission ([facility]    Will get her started on prozac 1076mWill recheck 1 month. Call with any concerns.       Relevant Medications   FLUoxetine (PROZAC) 10 MG capsule    Other Visit Diagnoses    Chronic tension-type headache, not intractable       Will check labs, Try to get sleep under better control, recheck 1 month.    Relevant Medications   naproxen sodium (ANAPROX) 220 MG tablet   FLUoxetine (PROZAC) 10 MG capsule   Other Relevant Orders   Comprehensive metabolic panel  TSH   UA/M w/rflx Culture, Routine   Dysphagia, unspecified type       Will start her on omeprazole for 1 month. If not improving, may need to see GI.    Relevant Orders   Comprehensive metabolic panel   TSH   UA/M w/rflx Culture, Routine   Screening for cholesterol level       Checking labs. Await results.    Relevant Orders   Lipid Panel w/o Chol/HDL Ratio       Follow up Little: Return in about 4 weeks (around 11/02/2016) for Follow up GERD and Mood.

## 2016-10-05 NOTE — Assessment & Plan Note (Signed)
Will start her on omeprazole for 1 month. If not improving, may need to see GI.

## 2016-10-05 NOTE — Assessment & Plan Note (Signed)
Will get her started on prozac 10mg . Will recheck 1 month. Call with any concerns.

## 2016-10-06 LAB — MICROSCOPIC EXAMINATION: RBC, UA: NONE SEEN /hpf (ref 0–?)

## 2016-10-06 LAB — CBC WITH DIFFERENTIAL/PLATELET
BASOS: 1 %
Basophils Absolute: 0.1 10*3/uL (ref 0.0–0.2)
EOS (ABSOLUTE): 0.1 10*3/uL (ref 0.0–0.4)
EOS: 1 %
HEMATOCRIT: 41.3 % (ref 34.0–46.6)
HEMOGLOBIN: 13.8 g/dL (ref 11.1–15.9)
Immature Grans (Abs): 0 10*3/uL (ref 0.0–0.1)
Immature Granulocytes: 0 %
LYMPHS ABS: 3.8 10*3/uL — AB (ref 0.7–3.1)
Lymphs: 36 %
MCH: 28.8 pg (ref 26.6–33.0)
MCHC: 33.4 g/dL (ref 31.5–35.7)
MCV: 86 fL (ref 79–97)
MONOCYTES: 8 %
Monocytes Absolute: 0.8 10*3/uL (ref 0.1–0.9)
NEUTROS ABS: 5.8 10*3/uL (ref 1.4–7.0)
Neutrophils: 54 %
Platelets: 393 10*3/uL — ABNORMAL HIGH (ref 150–379)
RBC: 4.79 x10E6/uL (ref 3.77–5.28)
RDW: 14 % (ref 12.3–15.4)
WBC: 10.6 10*3/uL (ref 3.4–10.8)

## 2016-10-06 LAB — COMPREHENSIVE METABOLIC PANEL
ALBUMIN: 4.3 g/dL (ref 3.5–5.5)
ALK PHOS: 72 IU/L (ref 39–117)
ALT: 23 IU/L (ref 0–32)
AST: 20 IU/L (ref 0–40)
Albumin/Globulin Ratio: 1.3 (ref 1.2–2.2)
BUN/Creatinine Ratio: 10 (ref 9–23)
BUN: 8 mg/dL (ref 6–20)
Bilirubin Total: 0.4 mg/dL (ref 0.0–1.2)
CALCIUM: 9.4 mg/dL (ref 8.7–10.2)
CO2: 25 mmol/L (ref 18–29)
CREATININE: 0.84 mg/dL (ref 0.57–1.00)
Chloride: 99 mmol/L (ref 96–106)
GFR calc Af Amer: 101 mL/min/{1.73_m2} (ref >59)
GFR, EST NON AFRICAN AMERICAN: 88 mL/min/{1.73_m2} (ref >59)
GLOBULIN, TOTAL: 3.2 g/dL (ref 1.5–4.5)
GLUCOSE: 100 mg/dL — AB (ref 65–99)
Potassium: 3.7 mmol/L (ref 3.5–5.2)
SODIUM: 139 mmol/L (ref 134–144)
Total Protein: 7.5 g/dL (ref 6.0–8.5)

## 2016-10-06 LAB — LIPID PANEL W/O CHOL/HDL RATIO
Cholesterol, Total: 225 mg/dL — ABNORMAL HIGH (ref 100–199)
HDL: 61 mg/dL (ref >39)
LDL CALC: 147 mg/dL — AB (ref 0–99)
TRIGLYCERIDES: 87 mg/dL (ref 0–149)
VLDL CHOLESTEROL CAL: 17 mg/dL (ref 5–40)

## 2016-10-06 LAB — TSH: TSH: 0.983 u[IU]/mL (ref 0.450–4.500)

## 2016-10-07 LAB — UA/M W/RFLX CULTURE, ROUTINE
Bilirubin, UA: NEGATIVE
Glucose, UA: NEGATIVE
Ketones, UA: NEGATIVE
LEUKOCYTES UA: NEGATIVE
Nitrite, UA: NEGATIVE
PH UA: 8.5 — AB (ref 5.0–7.5)
RBC, UA: NEGATIVE
Specific Gravity, UA: 1.015 (ref 1.005–1.030)
Urobilinogen, Ur: 1 mg/dL (ref 0.2–1.0)

## 2016-10-07 LAB — URINE CULTURE, REFLEX

## 2016-10-11 ENCOUNTER — Encounter: Payer: Self-pay | Admitting: Family Medicine

## 2016-10-11 DIAGNOSIS — E785 Hyperlipidemia, unspecified: Secondary | ICD-10-CM | POA: Insufficient documentation

## 2016-11-08 ENCOUNTER — Encounter: Payer: Self-pay | Admitting: Family Medicine

## 2016-11-08 ENCOUNTER — Ambulatory Visit (INDEPENDENT_AMBULATORY_CARE_PROVIDER_SITE_OTHER): Payer: 59 | Admitting: Family Medicine

## 2016-11-08 DIAGNOSIS — F331 Major depressive disorder, recurrent, moderate: Secondary | ICD-10-CM

## 2016-11-08 DIAGNOSIS — K219 Gastro-esophageal reflux disease without esophagitis: Secondary | ICD-10-CM

## 2016-11-08 MED ORDER — HYDROXYZINE HCL 25 MG PO TABS: 25.0000 mg | ORAL_TABLET | Freq: Three times a day (TID) | ORAL | 1 refills | Status: DC

## 2016-11-08 NOTE — Assessment & Plan Note (Signed)
Doing better with food choices. PRN omeprazole. Call with any concerns.

## 2016-11-08 NOTE — Progress Notes (Signed)
BP 128/88 (BP Location: Right Arm, Patient Position: Sitting, Cuff Size: Large)   Pulse 95   Temp 98.8 F (37.1 C)   Wt 210 lb 11.2 oz (95.6 kg)   SpO2 100%   BMI 35.89 kg/m    Subjective:    Patient ID: Maria Little, female    DOB: [birth year], 41 y.o.   MRN: [id number]  HPI: Maria Little is a 41 y.o. female  Chief Complaint  Patient presents with  . Depression  . Gastroesophageal Reflux   GERD GERD control status: better  Satisfied with current treatment? yes Heartburn frequency: occasionally Medication side effects: no  Medication compliance: PRN Antacid use frequency: occasional  Dysphagia: yes Odynophagia:  no Hematemesis: no Blood in stool: no EGD: no  DEPRESSION- has been having a lot of social issues with her family. Has been lending family money. Has not been taking her prozac every day, she was taking it about every 3-4 days Mood status: better Satisfied with current treatment?: no Symptom severity: moderate  Duration of current treatment : less than a month Side effects: no Medication compliance: poor compliance Psychotherapy/counseling: no  Previous psychiatric medications: prozac Depressed mood: yes Anxious mood: yes Anhedonia: no Significant weight loss or gain: no Insomnia: yes  Fatigue: yes Feelings of worthlessness or guilt: no Impaired concentration/indecisiveness: no Suicidal ideations: yes- 1x today Hopelessness: no Crying spells: no Depression screen [facility] 2/9 10/05/2016  Decreased Interest 0  Down, Depressed, Hopeless 0  PHQ - 2 Score 0  Altered sleeping 3  Tired, decreased energy 3  Change in appetite 3  Feeling bad or failure about yourself  0  Trouble concentrating 0  Moving slowly or fidgety/restless 1  Suicidal thoughts 0  PHQ-9 Score 10    Relevant past medical, surgical, family and social history reviewed and updated as indicated. Interim medical history since our last visit reviewed. Allergies and medications  reviewed and updated.  Review of Systems  Constitutional: Negative.   Respiratory: Negative.   Cardiovascular: Negative.   Psychiatric/Behavioral: Positive for dysphoric mood. Negative for agitation, behavioral problems, confusion, decreased concentration, hallucinations, self-injury, sleep disturbance and suicidal ideas. The patient is nervous/anxious. The patient is not hyperactive.     Per HPI unless specifically indicated above     Objective:    BP 128/88 (BP Location: Right Arm, Patient Position: Sitting, Cuff Size: Large)   Pulse 95   Temp 98.8 F (37.1 C)   Wt 210 lb 11.2 oz (95.6 kg)   SpO2 100%   BMI 35.89 kg/m   Wt Readings from Last 3 Encounters:  11/08/16 210 lb 11.2 oz (95.6 kg)  10/05/16 205 lb (93 kg)  08/07/16 213 lb (96.6 kg)    Physical Exam  Constitutional: She is oriented to person, place, and time. She appears well-developed and well-nourished. No distress.  HENT:  Head: Normocephalic and atraumatic.  Right Ear: Hearing normal.  Left Ear: Hearing normal.  Nose: Nose normal.  Eyes: Conjunctivae and lids are normal. Right eye exhibits no discharge. Left eye exhibits no discharge. No scleral icterus.  Pulmonary/Chest: Effort normal. No respiratory distress.  Musculoskeletal: Normal range of motion.  Neurological: She is alert and oriented to person, place, and time.  Skin: Skin is intact. No rash noted.  Psychiatric: She has a normal mood and affect. Her speech is normal and behavior is normal. Judgment and thought content normal. Cognition and memory are normal.    Results for orders placed or performed in visit on 10/05/16  Microscopic Examination  Result Value Ref Range   WBC, UA 0-5 0 - 5 /hpf   RBC, UA None seen 0 - 2 /hpf   Epithelial Cells (non renal) CANCELED    Bacteria, UA Many (A) None seen/Few  CBC with Differential/Platelet  Result Value Ref Range   WBC 10.6 3.4 - 10.8 x10E3/uL   RBC 4.79 3.77 - 5.28 x10E6/uL   Hemoglobin 13.8 11.1 -  15.9 g/dL   Hematocrit 29.5 62.1 - 46.6 %   MCV 86 79 - 97 fL   MCH 28.8 26.6 - 33.0 pg   MCHC 33.4 31.5 - 35.7 g/dL   RDW 30.8 65.7 - 84.6 %   Platelets 393 (H) 150 - 379 x10E3/uL   Neutrophils 54 Not Estab. %   Lymphs 36 Not Estab. %   Monocytes 8 Not Estab. %   Eos 1 Not Estab. %   Basos 1 Not Estab. %   Neutrophils Absolute 5.8 1.4 - 7.0 x10E3/uL   Lymphocytes Absolute 3.8 (H) 0.7 - 3.1 x10E3/uL   Monocytes Absolute 0.8 0.1 - 0.9 x10E3/uL   EOS (ABSOLUTE) 0.1 0.0 - 0.4 x10E3/uL   Basophils Absolute 0.1 0.0 - 0.2 x10E3/uL   Immature Granulocytes 0 Not Estab. %   Immature Grans (Abs) 0.0 0.0 - 0.1 x10E3/uL  Comprehensive metabolic panel  Result Value Ref Range   Glucose 100 (H) 65 - 99 mg/dL   BUN 8 6 - 20 mg/dL   Creatinine, Ser 9.62 0.57 - 1.00 mg/dL   GFR calc non Af Amer 88 >59 mL/min/1.73   GFR calc Af Amer 101 >59 mL/min/1.73   BUN/Creatinine Ratio 10 9 - 23   Sodium 139 134 - 144 mmol/L   Potassium 3.7 3.5 - 5.2 mmol/L   Chloride 99 96 - 106 mmol/L   CO2 25 18 - 29 mmol/L   Calcium 9.4 8.7 - 10.2 mg/dL   Total Protein 7.5 6.0 - 8.5 g/dL   Albumin 4.3 3.5 - 5.5 g/dL   Globulin, Total 3.2 1.5 - 4.5 g/dL   Albumin/Globulin Ratio 1.3 1.2 - 2.2   Bilirubin Total 0.4 0.0 - 1.2 mg/dL   Alkaline Phosphatase 72 39 - 117 IU/L   AST 20 0 - 40 IU/L   ALT 23 0 - 32 IU/L  Lipid Panel w/o Chol/HDL Ratio  Result Value Ref Range   Cholesterol, Total 225 (H) 100 - 199 mg/dL   Triglycerides 87 0 - 149 mg/dL   HDL 61 >95 mg/dL   VLDL Cholesterol Cal 17 5 - 40 mg/dL   LDL Calculated 284 (H) 0 - 99 mg/dL  TSH  Result Value Ref Range   TSH 0.983 0.450 - 4.500 uIU/mL  UA/M w/rflx Culture, Routine  Result Value Ref Range   Specific Gravity, UA 1.015 1.005 - 1.030   pH, UA 8.5 (H) 5.0 - 7.5   Color, UA Yellow Yellow   Appearance Ur Turbid (A) Clear   Leukocytes, UA Negative Negative   Protein, UA 1+ (A) Negative/Trace   Glucose, UA Negative Negative   Ketones, UA Negative  Negative   RBC, UA Negative Negative   Bilirubin, UA Negative Negative   Urobilinogen, Ur 1.0 0.2 - 1.0 mg/dL   Nitrite, UA Negative Negative   Microscopic Examination See below:    Urinalysis Reflex Comment   Urine Culture, Routine  Result Value Ref Range   Urine Culture, Routine Final report    Urine Culture result 1 Comment  Assessment & Plan:   Problem List Items Addressed This Visit      Digestive   Gastroesophageal reflux disease    Doing better with food choices. PRN omeprazole. Call with any concerns.         Other   Depression, major, recurrent, moderate (HCC)    Not under good control. Has not been taking medicine daily. Start medicine daily. PRN hydroxyzine for stress. Follow up in 1 month.       Relevant Medications   hydrOXYzine (ATARAX/VISTARIL) 25 MG tablet       Follow up plan: Return in about 4 weeks (around 12/06/2016) for Follow up mood.

## 2016-11-08 NOTE — Assessment & Plan Note (Signed)
Not under good control. Has not been taking medicine daily. Start medicine daily. PRN hydroxyzine for stress. Follow up in 1 month.

## 2016-11-08 NOTE — Addendum Note (Signed)
Addended by: Dorcas Carrow on: 11/08/2016 04:12 PM   Modules accepted: Orders

## 2016-11-22 ENCOUNTER — Ambulatory Visit (INDEPENDENT_AMBULATORY_CARE_PROVIDER_SITE_OTHER): Payer: 59

## 2016-11-22 DIAGNOSIS — Z3042 Encounter for surveillance of injectable contraceptive: Secondary | ICD-10-CM

## 2016-11-22 MED ORDER — MEDROXYPROGESTERONE ACETATE 150 MG/ML IM SUSP
150.0000 mg | Freq: Once | INTRAMUSCULAR | Status: AC
  Administered 2016-11-22: 150 mg via INTRAMUSCULAR

## 2016-12-09 ENCOUNTER — Ambulatory Visit (INDEPENDENT_AMBULATORY_CARE_PROVIDER_SITE_OTHER): Payer: 59 | Admitting: Family Medicine

## 2016-12-09 ENCOUNTER — Encounter: Payer: Self-pay | Admitting: Family Medicine

## 2016-12-09 VITALS — BP 112/74 | HR 114 | Temp 98.6°F | Ht 65.0 in | Wt 213.4 lb

## 2016-12-09 DIAGNOSIS — F331 Major depressive disorder, recurrent, moderate: Secondary | ICD-10-CM

## 2016-12-09 DIAGNOSIS — K921 Melena: Secondary | ICD-10-CM

## 2016-12-09 DIAGNOSIS — B079 Viral wart, unspecified: Secondary | ICD-10-CM

## 2016-12-09 MED ORDER — HYDROXYZINE HCL 25 MG PO TABS: 25.0000 mg | ORAL_TABLET | Freq: Three times a day (TID) | ORAL | 1 refills | Status: DC

## 2016-12-09 MED ORDER — OMEPRAZOLE 20 MG PO CPDR
20.0000 mg | DELAYED_RELEASE_CAPSULE | Freq: Every day | ORAL | 1 refills | Status: DC
Start: 2016-12-09 — End: 2017-03-14

## 2016-12-09 MED ORDER — FLUOXETINE HCL 10 MG PO CAPS: 10.0000 mg | ORAL_CAPSULE | Freq: Every day | ORAL | 1 refills | Status: DC

## 2016-12-09 NOTE — Assessment & Plan Note (Signed)
Under good control. Will continue current regimen and recheck in 2-3 months at physical. Call with any concerns.

## 2016-12-09 NOTE — Progress Notes (Signed)
BP 112/74 (BP Location: Left Arm, Patient Position: Sitting, Cuff Size: Normal)   Pulse (!) 114   Temp 98.6 F (37 C)   Ht 5\' 5"  (1.651 m)   Wt 213 lb 6.4 oz (96.8 kg)   SpO2 100%   BMI 35.51 kg/m    Subjective:    Patient ID: Maria Little, female    DOB: May 10, 1976, 41 y.o.   MRN: [id number]  HPI: Maria Little is a 41 y.o. female  Chief Complaint  Patient presents with  . Follow-up  . Depression   DEPRESSION Mood status: better Satisfied with current treatment?: yes Symptom severity: mild  Duration of current treatment : chronic Side effects: no Medication compliance: good compliance Psychotherapy/counseling: no  Previous psychiatric medications: prozac Depressed mood: yes Anxious mood: no Anhedonia: no Significant weight loss or gain: no Insomnia: no  Fatigue: no Feelings of worthlessness or guilt: no Impaired concentration/indecisiveness: no Suicidal ideations: no Hopelessness: no Crying spells: no Depression screen [facility] 2/9 12/09/2016 10/05/2016  Decreased Interest 0 0  Down, Depressed, Hopeless 0 0  PHQ - 2 Score 0 0  Altered sleeping 0 3  Tired, decreased energy 0 3  Change in appetite 1 3  Feeling bad or failure about yourself  0 0  Trouble concentrating 0 0  Moving slowly or fidgety/restless 0 1  Suicidal thoughts 0 0  PHQ-9 Score 1 10   Having some blood on stool- not every time. Wants to hold exam until she has her physical.   Has a wart on her R thumb that she has tried OTC remedies for, not going away.   Relevant past medical, surgical, family and social history reviewed and updated as indicated. Interim medical history since our last visit reviewed. Allergies and medications reviewed and updated.  Review of Systems  Constitutional: Negative.   Respiratory: Negative.   Cardiovascular: Negative.   Gastrointestinal: Negative.   Musculoskeletal: Negative.   Skin: Negative.        wart  Psychiatric/Behavioral: Negative.      Per HPI unless specifically indicated above     Objective:    BP 112/74 (BP Location: Left Arm, Patient Position: Sitting, Cuff Size: Normal)   Pulse (!) 114   Temp 98.6 F (37 C)   Ht 5\' 5"  (1.651 m)   Wt 213 lb 6.4 oz (96.8 kg)   SpO2 100%   BMI 35.51 kg/m   Wt Readings from Last 3 Encounters:  12/09/16 213 lb 6.4 oz (96.8 kg)  11/08/16 210 lb 11.2 oz (95.6 kg)  10/05/16 205 lb (93 kg)    Physical Exam  Constitutional: She is oriented to person, place, and time. She appears well-developed and well-nourished. No distress.  HENT:  Head: Normocephalic and atraumatic.  Right Ear: Hearing normal.  Left Ear: Hearing normal.  Nose: Nose normal.  Eyes: Conjunctivae and lids are normal. Right eye exhibits no discharge. Left eye exhibits no discharge. No scleral icterus.  Cardiovascular: Normal rate, regular rhythm, normal heart sounds and intact distal pulses.  Exam reveals no gallop and no friction rub.   No murmur heard. Pulmonary/Chest: Effort normal and breath sounds normal. No respiratory distress. She has no wheezes. She has no rales. She exhibits no tenderness.  Musculoskeletal: Normal range of motion.  Neurological: She is alert and oriented to person, place, and time.  Skin: Skin is warm and intact. No rash noted. She is not diaphoretic. No erythema. No pallor.  Wart on R thumb  Psychiatric: She  has a normal mood and affect. Her speech is normal and behavior is normal. Judgment and thought content normal. Cognition and memory are normal.  Nursing note and vitals reviewed.   Results for orders placed or performed in visit on 10/05/16  Microscopic Examination  Result Value Ref Range   WBC, UA 0-5 0 - 5 /hpf   RBC, UA None seen 0 - 2 /hpf   Epithelial Cells (non renal) CANCELED    Bacteria, UA Many (A) None seen/Few  CBC with Differential/Platelet  Result Value Ref Range   WBC 10.6 3.4 - 10.8 x10E3/uL   RBC 4.79 3.77 - 5.28 x10E6/uL   Hemoglobin 13.8 11.1 - 15.9  g/dL   Hematocrit 40.941.3 81.134.0 - 46.6 %   MCV 86 79 - 97 fL   MCH 28.8 26.6 - 33.0 pg   MCHC 33.4 31.5 - 35.7 g/dL   RDW 91.414.0 78.212.3 - 95.615.4 %   Platelets 393 (H) 150 - 379 x10E3/uL   Neutrophils 54 Not Estab. %   Lymphs 36 Not Estab. %   Monocytes 8 Not Estab. %   Eos 1 Not Estab. %   Basos 1 Not Estab. %   Neutrophils Absolute 5.8 1.4 - 7.0 x10E3/uL   Lymphocytes Absolute 3.8 (H) 0.7 - 3.1 x10E3/uL   Monocytes Absolute 0.8 0.1 - 0.9 x10E3/uL   EOS (ABSOLUTE) 0.1 0.0 - 0.4 x10E3/uL   Basophils Absolute 0.1 0.0 - 0.2 x10E3/uL   Immature Granulocytes 0 Not Estab. %   Immature Grans (Abs) 0.0 0.0 - 0.1 x10E3/uL  Comprehensive metabolic panel  Result Value Ref Range   Glucose 100 (H) 65 - 99 mg/dL   BUN 8 6 - 20 mg/dL   Creatinine, Ser 2.130.84 0.57 - 1.00 mg/dL   GFR calc non Af Amer 88 >59 mL/min/1.73   GFR calc Af Amer 101 >59 mL/min/1.73   BUN/Creatinine Ratio 10 9 - 23   Sodium 139 134 - 144 mmol/L   Potassium 3.7 3.5 - 5.2 mmol/L   Chloride 99 96 - 106 mmol/L   CO2 25 18 - 29 mmol/L   Calcium 9.4 8.7 - 10.2 mg/dL   Total Protein 7.5 6.0 - 8.5 g/dL   Albumin 4.3 3.5 - 5.5 g/dL   Globulin, Total 3.2 1.5 - 4.5 g/dL   Albumin/Globulin Ratio 1.3 1.2 - 2.2   Bilirubin Total 0.4 0.0 - 1.2 mg/dL   Alkaline Phosphatase 72 39 - 117 IU/L   AST 20 0 - 40 IU/L   ALT 23 0 - 32 IU/L  Lipid Panel w/o Chol/HDL Ratio  Result Value Ref Range   Cholesterol, Total 225 (H) 100 - 199 mg/dL   Triglycerides 87 0 - 149 mg/dL   HDL 61 >08>39 mg/dL   VLDL Cholesterol Cal 17 5 - 40 mg/dL   LDL Calculated 657147 (H) 0 - 99 mg/dL  TSH  Result Value Ref Range   TSH 0.983 0.450 - 4.500 uIU/mL  UA/M w/rflx Culture, Routine  Result Value Ref Range   Specific Gravity, UA 1.015 1.005 - 1.030   pH, UA 8.5 (H) 5.0 - 7.5   Color, UA Yellow Yellow   Appearance Ur Turbid (A) Clear   Leukocytes, UA Negative Negative   Protein, UA 1+ (A) Negative/Trace   Glucose, UA Negative Negative   Ketones, UA Negative  Negative   RBC, UA Negative Negative   Bilirubin, UA Negative Negative   Urobilinogen, Ur 1.0 0.2 - 1.0 mg/dL   Nitrite, UA Negative  Negative   Microscopic Examination See below:    Urinalysis Reflex Comment   Urine Culture, Routine  Result Value Ref Range   Urine Culture, Routine Final report    Urine Culture result 1 Comment       Assessment & Plan:   Problem List Items Addressed This Visit      Other   Depression, major, recurrent, moderate (HCC) - Primary    Under good control. Will continue current regimen and recheck in 2-3 months at physical. Call with any concerns.       Relevant Medications   FLUoxetine (PROZAC) 10 MG capsule   hydrOXYzine (ATARAX/VISTARIL) 25 MG tablet    Other Visit Diagnoses    Viral warts, unspecified type       Wants to hold on dermatology referral right now. Would like to try duct tape   Blood in stool       Likely due to hemorrhoid, does not want exam today, will check at physical. Warning signs for which to go to ER discussed.        Follow up plan: Return in about 3 months (around 03/11/2017) for Physical.

## 2017-01-05 ENCOUNTER — Telehealth: Payer: Self-pay

## 2017-01-05 NOTE — Telephone Encounter (Signed)
Left message on machine for pt to return call to the office.  

## 2017-02-07 ENCOUNTER — Ambulatory Visit (INDEPENDENT_AMBULATORY_CARE_PROVIDER_SITE_OTHER): Payer: 59

## 2017-02-07 DIAGNOSIS — Z3042 Encounter for surveillance of injectable contraceptive: Secondary | ICD-10-CM | POA: Diagnosis not present

## 2017-02-07 MED ORDER — MEDROXYPROGESTERONE ACETATE 150 MG/ML IM SUSP
150.0000 mg | Freq: Once | INTRAMUSCULAR | Status: AC
  Administered 2017-02-07: 150 mg via INTRAMUSCULAR

## 2017-02-17 ENCOUNTER — Telehealth: Payer: Self-pay | Admitting: Family Medicine

## 2017-02-17 NOTE — Telephone Encounter (Signed)
She should make sure that she is taking the medicine in the AM, not the PM- if she is still having trouble, have her come in and we can change her medicine.

## 2017-02-17 NOTE — Telephone Encounter (Signed)
Patient notified

## 2017-02-17 NOTE — Telephone Encounter (Signed)
Patient has been having trouble sleeping while taking medication Prozac. Patient would like to know what she should do in regards to getting her sleep while on the medication. Patient asked that if she needs to take melatonin it is fine she has just been missing a lot of sleep.  Patient was unsure if it was the Prozac or atarax medication but felt that it was the Prozac.  Please Advise.  Thank you

## 2017-02-17 NOTE — Telephone Encounter (Signed)
Forwarding to provider.

## 2017-03-14 ENCOUNTER — Other Ambulatory Visit: Payer: Self-pay | Admitting: Family Medicine

## 2017-03-14 ENCOUNTER — Ambulatory Visit (INDEPENDENT_AMBULATORY_CARE_PROVIDER_SITE_OTHER): Payer: 59 | Admitting: Family Medicine

## 2017-03-14 ENCOUNTER — Encounter: Payer: Self-pay | Admitting: Family Medicine

## 2017-03-14 VITALS — BP 127/87 | HR 97 | Temp 98.1°F | Ht 65.6 in | Wt 215.0 lb

## 2017-03-14 DIAGNOSIS — K625 Hemorrhage of anus and rectum: Secondary | ICD-10-CM

## 2017-03-14 DIAGNOSIS — E782 Mixed hyperlipidemia: Secondary | ICD-10-CM | POA: Diagnosis not present

## 2017-03-14 DIAGNOSIS — K219 Gastro-esophageal reflux disease without esophagitis: Secondary | ICD-10-CM

## 2017-03-14 DIAGNOSIS — Z124 Encounter for screening for malignant neoplasm of cervix: Secondary | ICD-10-CM | POA: Diagnosis not present

## 2017-03-14 DIAGNOSIS — Z23 Encounter for immunization: Secondary | ICD-10-CM

## 2017-03-14 DIAGNOSIS — F331 Major depressive disorder, recurrent, moderate: Secondary | ICD-10-CM | POA: Diagnosis not present

## 2017-03-14 DIAGNOSIS — Z1239 Encounter for other screening for malignant neoplasm of breast: Secondary | ICD-10-CM

## 2017-03-14 DIAGNOSIS — Z0000 Encounter for general adult medical examination without abnormal findings: Secondary | ICD-10-CM

## 2017-03-14 MED ORDER — OMEPRAZOLE 20 MG PO CPDR: 20.0000 mg | DELAYED_RELEASE_CAPSULE | Freq: Every day | ORAL | 1 refills | Status: DC

## 2017-03-14 MED ORDER — FLUOXETINE HCL 10 MG PO CAPS: 10.0000 mg | ORAL_CAPSULE | Freq: Every day | ORAL | 1 refills | Status: DC

## 2017-03-14 MED ORDER — HYDROXYZINE HCL 25 MG PO TABS: 25.0000 mg | ORAL_TABLET | Freq: Three times a day (TID) | ORAL | 1 refills | Status: DC

## 2017-03-14 NOTE — Assessment & Plan Note (Signed)
Has not been taking her medicine. Acting up. Restart and recheck in 1-2 months.

## 2017-03-14 NOTE — Progress Notes (Signed)
BP 127/87 (BP Location: Left Arm, Patient Position: Sitting, Cuff Size: Normal)   Pulse 97   Temp 98.1 F (36.7 C)   Ht 5' 5.6" (1.666 m)   Wt 215 lb (97.5 kg)   SpO2 99%   BMI 35.13 kg/m    Subjective:    Patient ID: Maria Little, female    DOB: 1975/12/03, 41 y.o.   MRN: [id number]  HPI: Maria Little is a 41 y.o. female presenting on 03/14/2017 for comprehensive medical examination. Current medical complaints include:  DEPRESSION- hasn't been taking her medicine. Has been under a lot of stress Mood status: worse Satisfied with current treatment?: yes Symptom severity: moderate  Duration of current treatment : months Side effects: no Medication compliance: poor compliance Psychotherapy/counseling: no  Previous psychiatric medications: prozac, hydroxyzine Depressed mood: yes Anxious mood: yes Anhedonia: no Significant weight loss or gain: no Insomnia: yes  Fatigue: yes Feelings of worthlessness or guilt: yes Impaired concentration/indecisiveness: no Suicidal ideations: no Hopelessness: no Crying spells: no Depression screen [facility] 2/9 03/14/2017 12/09/2016 10/05/2016  Decreased Interest 0 0 0  Down, Depressed, Hopeless 0 0 0  PHQ - 2 Score 0 0 0  Altered sleeping 2 0 3  Tired, decreased energy 0 0 3  Change in appetite 1 1 3   Feeling bad or failure about yourself  0 0 0  Trouble concentrating 0 0 0  Moving slowly or fidgety/restless 0 0 1  Suicidal thoughts 0 0 0  PHQ-9 Score 3 1 10    GAD 7 : Generalized Anxiety Score 03/14/2017  Nervous, Anxious, on Edge 0  Control/stop worrying 2  Worry too much - different things 3  Trouble relaxing 1  Restless 0  Easily annoyed or irritable 3  Afraid - awful might happen 1  Total GAD 7 Score 10  Anxiety Difficulty Somewhat difficult   Menopausal Symptoms: no  Past Medical History:  Past Medical History:  Diagnosis Date  . Migraine     Surgical History:  Past Surgical History:  Procedure Laterality Date    . BREAST SURGERY     Lumpectomy    Medications:  No current outpatient prescriptions on file prior to visit.   No current facility-administered medications on file prior to visit.     Allergies:  No Known Allergies  Social History:  Social History   Social History  . Marital status: Single    Spouse name: N/A  . Number of children: N/A  . Years of education: N/A   Occupational History  . Not on file.   Social History Main Topics  . Smoking status: Never Smoker  . Smokeless tobacco: Never Used  . Alcohol use Yes  . Drug use: No  . Sexual activity: No   Other Topics Concern  . Not on file   Social History Narrative  . No narrative on file   History  Smoking Status  . Never Smoker  Smokeless Tobacco  . Never Used   History  Alcohol Use  . Yes    Family History:  Family History  Problem Relation Age of Onset  . Diabetes Mother   . Hyperlipidemia Mother   . Hypertension Mother   . Hypertension Father   . Hypertension Brother     Past medical history, surgical history, medications, allergies, family history and social history reviewed with patient today and changes made to appropriate areas of the chart.   Review of Systems  Constitutional: Negative.   HENT: Negative.   Eyes:  Negative.   Respiratory: Negative.   Cardiovascular: Positive for chest pain (with not taking omeprazole and eating). Negative for palpitations, orthopnea, claudication, leg swelling and PND.  Gastrointestinal: Positive for blood in stool (off and on- 3 weeks ago), diarrhea and heartburn. Negative for abdominal pain, constipation, melena, nausea and vomiting.  Genitourinary: Negative.   Musculoskeletal: Negative.   Skin: Negative.   Neurological: Positive for dizziness. Negative for tingling, tremors, sensory change, speech change, focal weakness, seizures, loss of consciousness and headaches.  Endo/Heme/Allergies: Negative.   Psychiatric/Behavioral: Positive for depression.  Negative for hallucinations, memory loss, substance abuse and suicidal ideas. The patient is nervous/anxious and has insomnia.     All other ROS negative except what is listed above and in the HPI.      Objective:    BP 127/87 (BP Location: Left Arm, Patient Position: Sitting, Cuff Size: Normal)   Pulse 97   Temp 98.1 F (36.7 C)   Ht 5' 5.6" (1.666 m)   Wt 215 lb (97.5 kg)   SpO2 99%   BMI 35.13 kg/m   Wt Readings from Last 3 Encounters:  03/14/17 215 lb (97.5 kg)  12/09/16 213 lb 6.4 oz (96.8 kg)  11/08/16 210 lb 11.2 oz (95.6 kg)    Physical Exam  Constitutional: She is oriented to person, place, and time. She appears well-developed and well-nourished. No distress.  HENT:  Head: Normocephalic and atraumatic.  Right Ear: Hearing, tympanic membrane, external ear and ear canal normal.  Left Ear: Hearing, tympanic membrane, external ear and ear canal normal.  Nose: Nose normal.  Mouth/Throat: Uvula is midline, oropharynx is clear and moist and mucous membranes are normal. No oropharyngeal exudate.  Eyes: Pupils are equal, round, and reactive to light. Conjunctivae, EOM and lids are normal. Right eye exhibits no discharge. Left eye exhibits no discharge. No scleral icterus.  Neck: Normal range of motion. Neck supple. No JVD present. No tracheal deviation present. No thyromegaly present.  Cardiovascular: Normal rate, regular rhythm, normal heart sounds and intact distal pulses.  Exam reveals no gallop and no friction rub.   No murmur heard. Pulmonary/Chest: Effort normal and breath sounds normal. No stridor. No respiratory distress. She has no wheezes. She has no rales. She exhibits no tenderness. Right breast exhibits no inverted nipple, no mass, no nipple discharge, no skin change and no tenderness. Left breast exhibits no inverted nipple, no mass, no nipple discharge, no skin change and no tenderness. Breasts are symmetrical.  Abdominal: Soft. Bowel sounds are normal. She exhibits  no distension and no mass. There is no tenderness. There is no rebound and no guarding. Hernia confirmed negative in the right inguinal area and confirmed negative in the left inguinal area.  Genitourinary: Uterus normal. No labial fusion. There is no rash, tenderness, lesion or injury on the right labia. There is no rash, tenderness, lesion or injury on the left labia. No erythema, tenderness or bleeding in the vagina. No foreign body in the vagina. No signs of injury around the vagina. Vaginal discharge found.  Musculoskeletal: Normal range of motion. She exhibits no edema, tenderness or deformity.  Lymphadenopathy:    She has no cervical adenopathy.  Neurological: She is alert and oriented to person, place, and time. She has normal reflexes. She displays normal reflexes. No cranial nerve deficit. She exhibits normal muscle tone. Coordination normal.  Skin: Skin is dry and intact. No rash noted. She is not diaphoretic. No erythema. No pallor.  Psychiatric: She has a normal mood  and affect. Her speech is normal and behavior is normal. Judgment and thought content normal. Cognition and memory are normal.  Nursing note and vitals reviewed.   Results for orders placed or performed in visit on 10/05/16  Microscopic Examination  Result Value Ref Range   WBC, UA 0-5 0 - 5 /hpf   RBC, UA None seen 0 - 2 /hpf   Epithelial Cells (non renal) CANCELED    Bacteria, UA Many (A) None seen/Few  CBC with Differential/Platelet  Result Value Ref Range   WBC 10.6 3.4 - 10.8 x10E3/uL   RBC 4.79 3.77 - 5.28 x10E6/uL   Hemoglobin 13.8 11.1 - 15.9 g/dL   Hematocrit 09.8 11.9 - 46.6 %   MCV 86 79 - 97 fL   MCH 28.8 26.6 - 33.0 pg   MCHC 33.4 31.5 - 35.7 g/dL   RDW 14.7 82.9 - 56.2 %   Platelets 393 (H) 150 - 379 x10E3/uL   Neutrophils 54 Not Estab. %   Lymphs 36 Not Estab. %   Monocytes 8 Not Estab. %   Eos 1 Not Estab. %   Basos 1 Not Estab. %   Neutrophils Absolute 5.8 1.4 - 7.0 x10E3/uL   Lymphocytes  Absolute 3.8 (H) 0.7 - 3.1 x10E3/uL   Monocytes Absolute 0.8 0.1 - 0.9 x10E3/uL   EOS (ABSOLUTE) 0.1 0.0 - 0.4 x10E3/uL   Basophils Absolute 0.1 0.0 - 0.2 x10E3/uL   Immature Granulocytes 0 Not Estab. %   Immature Grans (Abs) 0.0 0.0 - 0.1 x10E3/uL  Comprehensive metabolic panel  Result Value Ref Range   Glucose 100 (H) 65 - 99 mg/dL   BUN 8 6 - 20 mg/dL   Creatinine, Ser 1.30 0.57 - 1.00 mg/dL   GFR calc non Af Amer 88 >59 mL/min/1.73   GFR calc Af Amer 101 >59 mL/min/1.73   BUN/Creatinine Ratio 10 9 - 23   Sodium 139 134 - 144 mmol/L   Potassium 3.7 3.5 - 5.2 mmol/L   Chloride 99 96 - 106 mmol/L   CO2 25 18 - 29 mmol/L   Calcium 9.4 8.7 - 10.2 mg/dL   Total Protein 7.5 6.0 - 8.5 g/dL   Albumin 4.3 3.5 - 5.5 g/dL   Globulin, Total 3.2 1.5 - 4.5 g/dL   Albumin/Globulin Ratio 1.3 1.2 - 2.2   Bilirubin Total 0.4 0.0 - 1.2 mg/dL   Alkaline Phosphatase 72 39 - 117 IU/L   AST 20 0 - 40 IU/L   ALT 23 0 - 32 IU/L  Lipid Panel w/o Chol/HDL Ratio  Result Value Ref Range   Cholesterol, Total 225 (H) 100 - 199 mg/dL   Triglycerides 87 0 - 149 mg/dL   HDL 61 >86 mg/dL   VLDL Cholesterol Cal 17 5 - 40 mg/dL   LDL Calculated 578 (H) 0 - 99 mg/dL  TSH  Result Value Ref Range   TSH 0.983 0.450 - 4.500 uIU/mL  UA/M w/rflx Culture, Routine  Result Value Ref Range   Specific Gravity, UA 1.015 1.005 - 1.030   pH, UA 8.5 (H) 5.0 - 7.5   Color, UA Yellow Yellow   Appearance Ur Turbid (A) Clear   Leukocytes, UA Negative Negative   Protein, UA 1+ (A) Negative/Trace   Glucose, UA Negative Negative   Ketones, UA Negative Negative   RBC, UA Negative Negative   Bilirubin, UA Negative Negative   Urobilinogen, Ur 1.0 0.2 - 1.0 mg/dL   Nitrite, UA Negative Negative   Microscopic Examination  See below:    Urinalysis Reflex Comment   Urine Culture, Routine  Result Value Ref Range   Urine Culture, Routine Final report    Organism ID, Bacteria Comment       Assessment & Plan:   Problem List  Items Addressed This Visit      Digestive   Gastroesophageal reflux disease    Has not been taking her medicine. Acting up. Restart and recheck in 1-2 months.       Relevant Medications   omeprazole (PRILOSEC) 20 MG capsule   Other Relevant Orders   Comprehensive metabolic panel     Other   Depression, major, recurrent, moderate (HCC)    Has not been taking her medicine. Restart and recheck in 1-2 months.       Relevant Medications   hydrOXYzine (ATARAX/VISTARIL) 25 MG tablet   FLUoxetine (PROZAC) 10 MG capsule   Other Relevant Orders   CBC with Differential/Platelet   Comprehensive metabolic panel   TSH   Hyperlipidemia    Rechecking levels today. Await results.       Relevant Orders   Comprehensive metabolic panel   Lipid Panel w/o Chol/HDL Ratio    Other Visit Diagnoses    Routine general medical examination at a health care facility    -  Primary   Vaccines updated. Screening labs checked today. Mammogram ordered. Continue diet and exercise. Call with any concerns.    Relevant Orders   CBC with Differential/Platelet   Comprehensive metabolic panel   Lipid Panel w/o Chol/HDL Ratio   TSH   UA/M w/rflx Culture, Routine   Screening for breast cancer       Mammogram ordered today.   Relevant Orders   Comprehensive metabolic panel   MM DIGITAL SCREENING BILATERAL   Screening for cervical cancer       Pap done today.    Relevant Orders   IGP, Aptima HPV, rfx 16/18,45   Immunization due       tdap given today   Relevant Orders   Tdap vaccine greater than or equal to 7yo IM (Completed)   Rectal bleeding       FOBT given to patient. Await results.    Relevant Orders   IFOBT POC (occult bld, rslt in office)       Follow up plan: Return in about 4 weeks (around 04/11/2017) for Follow up mood.   LABORATORY TESTING:  - Pap smear: pap done  IMMUNIZATIONS:   - Tdap: Tetanus vaccination status reviewed: Tdap vaccination indicated and given today. - Influenza:  Postponed to flu season - Pneumovax: Not applicable  SCREENING: -Mammogram: Ordered today   PATIENT COUNSELING:   Advised to take 1 mg of folate supplement per day if capable of pregnancy.   Sexuality: Discussed sexually transmitted diseases, partner selection, use of condoms, avoidance of unintended pregnancy  and contraceptive alternatives.   Advised to avoid cigarette smoking.  I discussed with the patient that most people either abstain from alcohol or drink within safe limits (<=14/week and <=4 drinks/occasion for males, <=7/weeks and <= 3 drinks/occasion for females) and that the risk for alcohol disorders and other health effects rises proportionally with the number of drinks per week and how often a drinker exceeds daily limits.  Discussed cessation/primary prevention of drug use and availability of treatment for abuse.   Diet: Encouraged to adjust caloric intake to maintain  or achieve ideal body weight, to reduce intake of dietary saturated fat and total fat, to limit sodium  intake by avoiding high sodium foods and not adding table salt, and to maintain adequate dietary potassium and calcium preferably from fresh fruits, vegetables, and low-fat dairy products.    stressed the importance of regular exercise  Injury prevention: Discussed safety belts, safety helmets, smoke detector, smoking near bedding or upholstery.   Dental health: Discussed importance of regular tooth brushing, flossing, and dental visits.    NEXT PREVENTATIVE PHYSICAL DUE IN 1 YEAR. Return in about 4 weeks (around 04/11/2017) for Follow up mood.

## 2017-03-14 NOTE — Patient Instructions (Addendum)

## 2017-03-14 NOTE — Assessment & Plan Note (Signed)
Has not been taking her medicine. Restart and recheck in 1-2 months.

## 2017-03-14 NOTE — Assessment & Plan Note (Signed)
Rechecking levels today. Await results.  

## 2017-03-15 ENCOUNTER — Encounter: Payer: Self-pay | Admitting: Family Medicine

## 2017-03-15 ENCOUNTER — Telehealth: Payer: Self-pay

## 2017-03-15 LAB — MICROSCOPIC EXAMINATION
BACTERIA UA: NONE SEEN
RBC, UA: NONE SEEN /hpf (ref 0–?)

## 2017-03-15 LAB — UA/M W/RFLX CULTURE, ROUTINE
Bilirubin, UA: NEGATIVE
Glucose, UA: NEGATIVE
LEUKOCYTES UA: NEGATIVE
Nitrite, UA: NEGATIVE
RBC, UA: NEGATIVE
Urobilinogen, Ur: 0.2 mg/dL (ref 0.2–1.0)
pH, UA: 5.5 (ref 5.0–7.5)

## 2017-03-15 LAB — COMPREHENSIVE METABOLIC PANEL
ALT: 30 IU/L (ref 0–32)
AST: 30 IU/L (ref 0–40)
Albumin/Globulin Ratio: 1.2 (ref 1.2–2.2)
Albumin: 4.3 g/dL (ref 3.5–5.5)
Alkaline Phosphatase: 86 IU/L (ref 39–117)
BUN/Creatinine Ratio: 6 — ABNORMAL LOW (ref 9–23)
BUN: 5 mg/dL — AB (ref 6–24)
Bilirubin Total: 0.3 mg/dL (ref 0.0–1.2)
CALCIUM: 9.6 mg/dL (ref 8.7–10.2)
CHLORIDE: 98 mmol/L (ref 96–106)
CO2: 25 mmol/L (ref 20–29)
Creatinine, Ser: 0.83 mg/dL (ref 0.57–1.00)
GFR, EST AFRICAN AMERICAN: 102 mL/min/{1.73_m2} (ref >59)
GFR, EST NON AFRICAN AMERICAN: 88 mL/min/{1.73_m2} (ref >59)
GLUCOSE: 128 mg/dL — AB (ref 65–99)
Globulin, Total: 3.5 g/dL (ref 1.5–4.5)
Potassium: 3.4 mmol/L — ABNORMAL LOW (ref 3.5–5.2)
Sodium: 138 mmol/L (ref 134–144)
TOTAL PROTEIN: 7.8 g/dL (ref 6.0–8.5)

## 2017-03-15 LAB — CBC WITH DIFFERENTIAL/PLATELET
BASOS: 0 %
Basophils Absolute: 0 10*3/uL (ref 0.0–0.2)
EOS (ABSOLUTE): 0.1 10*3/uL (ref 0.0–0.4)
EOS: 2 %
HEMATOCRIT: 40.1 % (ref 34.0–46.6)
HEMOGLOBIN: 13.3 g/dL (ref 11.1–15.9)
IMMATURE GRANS (ABS): 0 10*3/uL (ref 0.0–0.1)
Immature Granulocytes: 0 %
LYMPHS: 40 %
Lymphocytes Absolute: 3.3 10*3/uL — ABNORMAL HIGH (ref 0.7–3.1)
MCH: 27.2 pg (ref 26.6–33.0)
MCHC: 33.2 g/dL (ref 31.5–35.7)
MCV: 82 fL (ref 79–97)
MONOCYTES: 7 %
Monocytes Absolute: 0.6 10*3/uL (ref 0.1–0.9)
NEUTROS ABS: 4.2 10*3/uL (ref 1.4–7.0)
Neutrophils: 51 %
Platelets: 332 10*3/uL (ref 150–379)
RBC: 4.89 x10E6/uL (ref 3.77–5.28)
RDW: 14.9 % (ref 12.3–15.4)
WBC: 8.2 10*3/uL (ref 3.4–10.8)

## 2017-03-15 LAB — LIPID PANEL W/O CHOL/HDL RATIO
Cholesterol, Total: 227 mg/dL — ABNORMAL HIGH (ref 100–199)
HDL: 57 mg/dL (ref >39)
LDL Calculated: 139 mg/dL — ABNORMAL HIGH (ref 0–99)
Triglycerides: 156 mg/dL — ABNORMAL HIGH (ref 0–149)
VLDL CHOLESTEROL CAL: 31 mg/dL (ref 5–40)

## 2017-03-15 LAB — TSH: TSH: 1.22 u[IU]/mL (ref 0.450–4.500)

## 2017-03-15 NOTE — Telephone Encounter (Signed)
Screening should be good right now.

## 2017-03-15 NOTE — Telephone Encounter (Signed)
Patient states that she has had surgery on each breast and that she currently has lumps in both breast, but it is due to fibrocystic breasts, does her mammogram need to be a diagnostic or screening.   Also patient will go sign records release and schedule an appointment at that time, unable to schedule until release is signed.

## 2017-03-15 NOTE — Telephone Encounter (Signed)
-----   Message from Dorcas Carrow, DO sent at 03/14/2017  4:57 PM EDT ----- Mammogram at [facility]- any day 4PM except next week.

## 2017-03-15 NOTE — Telephone Encounter (Signed)
Called patient to see if her previous mammograms were normal, if not she will need to go to [place] to sign a records release.   Left message for patient to return my call.

## 2017-03-16 LAB — IGP, APTIMA HPV, RFX 16/18,45
HPV Aptima: NEGATIVE
PAP Smear Comment: 0

## 2017-04-04 ENCOUNTER — Ambulatory Visit: Payer: 59 | Admitting: Family Medicine

## 2017-04-11 ENCOUNTER — Encounter: Payer: Self-pay | Admitting: Family Medicine

## 2017-04-11 ENCOUNTER — Ambulatory Visit (INDEPENDENT_AMBULATORY_CARE_PROVIDER_SITE_OTHER): Payer: 59 | Admitting: Family Medicine

## 2017-04-11 VITALS — BP 126/85 | HR 109 | Temp 98.9°F | Wt 218.3 lb

## 2017-04-11 DIAGNOSIS — Z113 Encounter for screening for infections with a predominantly sexual mode of transmission: Secondary | ICD-10-CM

## 2017-04-11 DIAGNOSIS — F331 Major depressive disorder, recurrent, moderate: Secondary | ICD-10-CM | POA: Diagnosis not present

## 2017-04-11 MED ORDER — SERTRALINE HCL 25 MG PO TABS: 25.0000 mg | ORAL_TABLET | Freq: Every day | ORAL | 1 refills | Status: DC

## 2017-04-11 NOTE — Progress Notes (Signed)
BP 126/85 (BP Location: Left Arm, Patient Position: Sitting, Cuff Size: Large)   Pulse (!) 109   Temp 98.9 F (37.2 C)   Wt 218 lb 5 oz (99 kg)   SpO2 100%   BMI 35.67 kg/m    Subjective:    Patient ID: Maria Little, female    DOB: 11-27-1975, 41 y.o.   MRN: [id number]  HPI: Maria Little is a 41 y.o. female  Chief Complaint  Patient presents with  . Depression   DEPRESSION- has not been taking medication regularly Mood status: better Satisfied with current treatment?: no Symptom severity: moderate  Duration of current treatment : about a month Side effects: no Medication compliance: fair compliance Psychotherapy/counseling: no  Previous psychiatric medications: prozac, hydroxyzine Depressed mood: yes Anxious mood: yes Anhedonia: no Significant weight loss or gain: no Insomnia: no  Fatigue: yes Feelings of worthlessness or guilt: yes Impaired concentration/indecisiveness: no Suicidal ideations: no Hopelessness: no Crying spells: no Depression screen [facility] 2/9 04/11/2017 03/14/2017 12/09/2016 10/05/2016  Decreased Interest 0 0 0 0  Down, Depressed, Hopeless 0 0 0 0  PHQ - 2 Score 0 0 0 0  Altered sleeping 3 2 0 3  Tired, decreased energy 0 0 0 3  Change in appetite Feeling bad or failure about yourself  0 0 0 0  Trouble concentrating 0 0 0 0  Moving slowly or fidgety/restless 0 0 0 1  Suicidal thoughts 0 0 0 0  PHQ-9 Score Relevant past medical, surgical, family and social history reviewed and updated as indicated. Interim medical history since our last visit reviewed. Allergies and medications reviewed and updated.  Review of Systems  Constitutional: Negative.   Respiratory: Negative.   Cardiovascular: Negative.   Psychiatric/Behavioral: Positive for agitation and dysphoric mood. Negative for behavioral problems, confusion, decreased concentration, hallucinations, self-injury, sleep disturbance and suicidal ideas. The patient is  nervous/anxious. The patient is not hyperactive.     Per HPI unless specifically indicated above     Objective:    BP 126/85 (BP Location: Left Arm, Patient Position: Sitting, Cuff Size: Large)   Pulse (!) 109   Temp 98.9 F (37.2 C)   Wt 218 lb 5 oz (99 kg)   SpO2 100%   BMI 35.67 kg/m   Wt Readings from Last 3 Encounters:  04/11/17 218 lb 5 oz (99 kg)  03/14/17 215 lb (97.5 kg)  12/09/16 213 lb 6.4 oz (96.8 kg)    Physical Exam  Constitutional: She is oriented to person, place, and time. She appears well-developed and well-nourished. No distress.  HENT:  Head: Normocephalic and atraumatic.  Right Ear: Hearing normal.  Left Ear: Hearing normal.  Nose: Nose normal.  Eyes: Conjunctivae and lids are normal. Right eye exhibits no discharge. Left eye exhibits no discharge. No scleral icterus.  Cardiovascular: Normal rate, regular rhythm, normal heart sounds and intact distal pulses.  Exam reveals no gallop and no friction rub.   No murmur heard. Pulmonary/Chest: Effort normal and breath sounds normal. No respiratory distress. She has no wheezes. She has no rales. She exhibits no tenderness.  Musculoskeletal: Normal range of motion.  Neurological: She is alert and oriented to person, place, and time.  Skin: Skin is warm, dry and intact. No rash noted. She is not diaphoretic. No erythema. No pallor.  Psychiatric: She has a normal mood and affect. Her speech is normal and behavior is normal. Judgment and thought  content normal. Cognition and memory are normal.  Nursing note and vitals reviewed.   Results for orders placed or performed in visit on 03/14/17  CBC with Differential/Platelet  Result Value Ref Range   WBC 8.2 3.4 - 10.8 x10E3/uL   RBC 4.89 3.77 - 5.28 x10E6/uL   Hemoglobin 13.3 11.1 - 15.9 g/dL   Hematocrit 16.1 09.6 - 46.6 %   MCV 82 79 - 97 fL   MCH 27.2 26.6 - 33.0 pg   MCHC 33.2 31.5 - 35.7 g/dL   RDW 04.5 40.9 - 81.1 %   Platelets 332 150 - 379 x10E3/uL    Neutrophils 51 Not Estab. %   Lymphs 40 Not Estab. %   Monocytes 7 Not Estab. %   Eos 2 Not Estab. %   Basos 0 Not Estab. %   Neutrophils Absolute 4.2 1.4 - 7.0 x10E3/uL   Lymphocytes Absolute 3.3 (H) 0.7 - 3.1 x10E3/uL   Monocytes Absolute 0.6 0.1 - 0.9 x10E3/uL   EOS (ABSOLUTE) 0.1 0.0 - 0.4 x10E3/uL   Basophils Absolute 0.0 0.0 - 0.2 x10E3/uL   Immature Granulocytes 0 Not Estab. %   Immature Grans (Abs) 0.0 0.0 - 0.1 x10E3/uL      Assessment & Plan:   Problem List Items Addressed This Visit      Other   Depression, major, recurrent, moderate (HCC) - Primary    Feeling agitated on the fluoxetine. Will change to zoloft. Recheck 1 month. Feeling better.       Relevant Medications   sertraline (ZOLOFT) 25 MG tablet    Other Visit Diagnoses    Screening for STD (sexually transmitted disease)       Labs drawn today. Await results.    Relevant Orders   HIV antibody       Follow up plan: Return in about 4 weeks (around 05/09/2017) for Follow up mood.

## 2017-04-11 NOTE — Assessment & Plan Note (Signed)
Feeling agitated on the fluoxetine. Will change to zoloft. Recheck 1 month. Feeling better.

## 2017-04-12 ENCOUNTER — Encounter: Payer: Self-pay | Admitting: Family Medicine

## 2017-04-12 LAB — HIV ANTIBODY (ROUTINE TESTING W REFLEX): HIV Screen 4th Generation wRfx: NONREACTIVE

## 2017-04-21 ENCOUNTER — Telehealth: Payer: Self-pay | Admitting: Family Medicine

## 2017-04-21 NOTE — Telephone Encounter (Signed)
Patient requesting a refill on her medication for Prilosec sent to [facility] pharmacy on [address] Patient is fully out of medication and leaving to go out of town tomorrow.  Please Advise.  Thank you

## 2017-04-21 NOTE — Telephone Encounter (Signed)
Patient notified

## 2017-05-09 ENCOUNTER — Encounter: Payer: Self-pay | Admitting: Family Medicine

## 2017-05-09 ENCOUNTER — Ambulatory Visit (INDEPENDENT_AMBULATORY_CARE_PROVIDER_SITE_OTHER): Payer: 59 | Admitting: Family Medicine

## 2017-05-09 VITALS — BP 129/85 | HR 112 | Temp 98.4°F | Wt 210.2 lb

## 2017-05-09 DIAGNOSIS — Z23 Encounter for immunization: Secondary | ICD-10-CM

## 2017-05-09 DIAGNOSIS — F331 Major depressive disorder, recurrent, moderate: Secondary | ICD-10-CM

## 2017-05-09 DIAGNOSIS — Z3042 Encounter for surveillance of injectable contraceptive: Secondary | ICD-10-CM | POA: Diagnosis not present

## 2017-05-09 MED ORDER — HYDROXYZINE HCL 25 MG PO TABS: 25.0000 mg | ORAL_TABLET | Freq: Three times a day (TID) | ORAL | 1 refills | Status: DC

## 2017-05-09 MED ORDER — OMEPRAZOLE 20 MG PO CPDR: 20.0000 mg | DELAYED_RELEASE_CAPSULE | Freq: Every day | ORAL | 1 refills | Status: DC

## 2017-05-09 MED ORDER — SERTRALINE HCL 25 MG PO TABS: 25.0000 mg | ORAL_TABLET | Freq: Every day | ORAL | 1 refills | Status: DC

## 2017-05-09 MED ORDER — MEDROXYPROGESTERONE ACETATE 150 MG/ML IM SUSP
150.0000 mg | INTRAMUSCULAR | Status: AC
  Administered 2017-05-09 – 2018-04-10 (×4): 150 mg via INTRAMUSCULAR

## 2017-05-09 NOTE — Assessment & Plan Note (Signed)
Under good control. COntinue current regimen. Continue to monitor. Call with any concerns. Recheck 6 months.

## 2017-05-09 NOTE — Patient Instructions (Addendum)

## 2017-05-09 NOTE — Progress Notes (Signed)
BP 129/85 (BP Location: Left Arm, Patient Position: Sitting, Cuff Size: Large)   Pulse (!) 112   Temp 98.4 F (36.9 C)   Wt 210 lb 3 oz (95.3 kg)   SpO2 100%   BMI 34.34 kg/m    Subjective:    Patient ID: Maria Little, female    DOB: 08-Oct-1975, 41 y.o.   MRN: [id number]  HPI: Maria Little is a 41 y.o. female  Chief Complaint  Patient presents with  . Depression   DEPRESSION Mood status: better Satisfied with current treatment?: no Symptom severity: moderate  Duration of current treatment : months Side effects: no Medication compliance: excellent compliance Psychotherapy/counseling: no  Previous psychiatric medications: sertraline Depressed mood: no Anxious mood: yes Anhedonia: no Significant weight loss or gain: no Insomnia: no  Fatigue: no Feelings of worthlessness or guilt: no Impaired concentration/indecisiveness: no Suicidal ideations: no Hopelessness: no Crying spells: no Depression screen [facility] 2/9 05/09/2017 04/11/2017 03/14/2017 12/09/2016 10/05/2016  Decreased Interest 0 0 0 0 0  Down, Depressed, Hopeless 0 0 0 0 0  PHQ - 2 Score 0 0 0 0 0  Altered sleeping 0 3 2 0 3  Tired, decreased energy 0 0 0 0 3  Change in appetite 0 3 1 1 3   Feeling bad or failure about yourself  0 0 0 0 0  Trouble concentrating 0 0 0 0 0  Moving slowly or fidgety/restless 0 0 0 0 1  Suicidal thoughts 0 0 0 0 0  PHQ-9 Score 0 6 3 1 10   Difficult doing work/chores Not difficult at all - - - -   GAD 7 : Generalized Anxiety Score 05/09/2017 03/14/2017  Nervous, Anxious, on Edge 0 0  Control/stop worrying 1 2  Worry too much - different things 1 3  Trouble relaxing 1 1  Restless 0 0  Easily annoyed or irritable 0 3  Afraid - awful might happen 0 1  Total GAD 7 Score 3 10  Anxiety Difficulty Somewhat difficult Somewhat difficult    Relevant past medical, surgical, family and social history reviewed and updated as indicated. Interim medical history since our last visit  reviewed. Allergies and medications reviewed and updated.  Review of Systems  Constitutional: Negative.   Respiratory: Negative.   Cardiovascular: Negative.   Psychiatric/Behavioral: Positive for dysphoric mood. Negative for agitation, behavioral problems, confusion, decreased concentration, hallucinations, self-injury, sleep disturbance and suicidal ideas. The patient is nervous/anxious. The patient is not hyperactive.     Per HPI unless specifically indicated above     Objective:    BP 129/85 (BP Location: Left Arm, Patient Position: Sitting, Cuff Size: Large)   Pulse (!) 112   Temp 98.4 F (36.9 C)   Wt 210 lb 3 oz (95.3 kg)   SpO2 100%   BMI 34.34 kg/m   Wt Readings from Last 3 Encounters:  05/09/17 210 lb 3 oz (95.3 kg)  04/11/17 218 lb 5 oz (99 kg)  03/14/17 215 lb (97.5 kg)    Physical Exam  Constitutional: She is oriented to person, place, and time. She appears well-developed and well-nourished. No distress.  HENT:  Head: Normocephalic and atraumatic.  Right Ear: Hearing normal.  Left Ear: Hearing normal.  Nose: Nose normal.  Eyes: Conjunctivae and lids are normal. Right eye exhibits no discharge. Left eye exhibits no discharge. No scleral icterus.  Cardiovascular: Normal rate, regular rhythm, normal heart sounds and intact distal pulses.  Exam reveals no gallop and no friction rub.  No murmur heard. Pulmonary/Chest: Effort normal and breath sounds normal. No respiratory distress. She has no wheezes. She has no rales. She exhibits no tenderness.  Musculoskeletal: Normal range of motion.  Neurological: She is alert and oriented to person, place, and time.  Skin: Skin is warm, dry and intact. No rash noted. She is not diaphoretic. No erythema. No pallor.  Psychiatric: She has a normal mood and affect. Her speech is normal and behavior is normal. Judgment and thought content normal. Cognition and memory are normal.  Nursing note and vitals reviewed.   Results for  orders placed or performed in visit on 04/11/17  HIV antibody  Result Value Ref Range   HIV Screen 4th Generation wRfx Non Reactive Non Reactive      Assessment & Plan:   Problem List Items Addressed This Visit      Other   Depression, major, recurrent, moderate (HCC) - Primary    Under good control. COntinue current regimen. Continue to monitor. Call with any concerns. Recheck 6 months.       Relevant Medications   sertraline (ZOLOFT) 25 MG tablet   hydrOXYzine (ATARAX/VISTARIL) 25 MG tablet    Other Visit Diagnoses    Immunization due       Flu shot given today.   Relevant Orders   Flu Vaccine QUAD 6+ mos PF IM (Fluarix Quad PF) (Completed)   Encounter for surveillance of injectable contraceptive       Depo given today.   Relevant Medications   medroxyPROGESTERone (DEPO-PROVERA) injection 150 mg       Follow up plan: Return in about 6 months (around 11/07/2017) for Follow up mood.

## 2017-08-01 ENCOUNTER — Ambulatory Visit (INDEPENDENT_AMBULATORY_CARE_PROVIDER_SITE_OTHER): Payer: 59

## 2017-08-01 DIAGNOSIS — Z3042 Encounter for surveillance of injectable contraceptive: Secondary | ICD-10-CM | POA: Diagnosis not present

## 2017-10-21 ENCOUNTER — Ambulatory Visit: Payer: 59

## 2017-10-21 DIAGNOSIS — Z3042 Encounter for surveillance of injectable contraceptive: Secondary | ICD-10-CM

## 2017-11-07 ENCOUNTER — Ambulatory Visit: Payer: 59 | Admitting: Family Medicine

## 2017-11-07 ENCOUNTER — Encounter: Payer: Self-pay | Admitting: Family Medicine

## 2017-11-07 VITALS — BP 126/92 | HR 121 | Temp 98.5°F | Wt 212.3 lb

## 2017-11-07 DIAGNOSIS — F331 Major depressive disorder, recurrent, moderate: Secondary | ICD-10-CM | POA: Diagnosis not present

## 2017-11-07 DIAGNOSIS — K219 Gastro-esophageal reflux disease without esophagitis: Secondary | ICD-10-CM

## 2017-11-07 MED ORDER — OMEPRAZOLE 20 MG PO CPDR: 20.0000 mg | DELAYED_RELEASE_CAPSULE | Freq: Every day | ORAL | 1 refills | Status: DC

## 2017-11-07 MED ORDER — HYDROXYZINE HCL 25 MG PO TABS: 25.0000 mg | ORAL_TABLET | Freq: Three times a day (TID) | ORAL | 1 refills | Status: DC | PRN

## 2017-11-07 MED ORDER — SERTRALINE HCL 25 MG PO TABS: 25.0000 mg | ORAL_TABLET | Freq: Every day | ORAL | 1 refills | Status: DC

## 2017-11-07 NOTE — Assessment & Plan Note (Signed)
Has been off her medicine for 2 months. Not doing well. Will restart her medicine and recheck in 4-6 weeks. Call with any concerns.  

## 2017-11-07 NOTE — Assessment & Plan Note (Signed)
Has been off her medicine for 2 months. Not doing well. Will restart her medicine and recheck in 4-6 weeks. Call with any concerns.

## 2017-11-07 NOTE — Progress Notes (Signed)
BP (!) 126/92 (BP Location: Right Arm, Patient Position: Sitting, Cuff Size: Normal)   Pulse (!) 121   Temp 98.5 F (36.9 C)   Wt 212 lb 5 oz (96.3 kg)   SpO2 100%   BMI 34.69 kg/m    Subjective:    Patient ID: Maria Little, female    DOB: [birth year], 42 y.o.   MRN: [id number]  HPI: Maria Little is a 42 y.o. female  Chief Complaint  Patient presents with  . Depression  . Gastroesophageal Reflux   DEPRESSION- has not been doing well. Stopped taking her medicine in probably February. She notes that she has not been doing well.  Mood status: uncontrolled Satisfied with current treatment?: no Symptom severity: severe  Duration of current treatment : Has been off her medicine for about 2 months Side effects: no Medication compliance: poor compliance Psychotherapy/counseling: no  Previous psychiatric medications: zolloft Depressed mood: yes Anxious mood: yes Anhedonia: no Significant weight loss or gain: no Insomnia: no  Fatigue: yes Feelings of worthlessness or guilt: yes Impaired concentration/indecisiveness: no Suicidal ideations: no Hopelessness: no Crying spells: no Depression screen [facility] 2/9 11/07/2017 05/09/2017 04/11/2017 03/14/2017 12/09/2016  Decreased Interest 0 0 0 0 0  Down, Depressed, Hopeless 0 0 0 0 0  PHQ - 2 Score 0 0 0 0 0  Altered sleeping 2 0 3 2 0  Tired, decreased energy 1 0 0 0 0  Change in appetite 2 0 3 1 1   Feeling bad or failure about yourself  0 0 0 0 0  Trouble concentrating 0 0 0 0 0  Moving slowly or fidgety/restless 0 0 0 0 0  Suicidal thoughts 0 0 0 0 0  PHQ-9 Score 5 0 6 3 1   Difficult doing work/chores Somewhat difficult Not difficult at all - - -   GAD 7 : Generalized Anxiety Score 11/07/2017 05/09/2017 03/14/2017  Nervous, Anxious, on Edge 0 0 0  Control/stop worrying 1 1 2   Worry too much - different things 2 1 3   Trouble relaxing 1 1 1   Restless 0 0 0  Easily annoyed or irritable 2 0 3  Afraid - awful might happen 1  0 1  Total GAD 7 Score 7 3 10   Anxiety Difficulty Very difficult Somewhat difficult Somewhat difficult    GERD- has been off her medicine for 2 months GERD control status: exacerbated  Satisfied with current treatment? no Heartburn frequency: daily for the last 3 days Medication side effects: no  Medication compliance: worse Dysphagia: no Odynophagia:  no Hematemesis: no Blood in stool: no EGD: no   Relevant past medical, surgical, family and social history reviewed and updated as indicated. Interim medical history since our last visit reviewed. Allergies and medications reviewed and updated.  Review of Systems  Constitutional: Negative.   Respiratory: Negative.   Cardiovascular: Negative.   Gastrointestinal: Positive for abdominal pain. Negative for abdominal distention, anal bleeding, blood in stool, constipation, diarrhea, nausea, rectal pain and vomiting.  Neurological: Negative.   Psychiatric/Behavioral: Positive for agitation, behavioral problems and dysphoric mood. Negative for confusion, decreased concentration, hallucinations, self-injury, sleep disturbance and suicidal ideas. The patient is nervous/anxious. The patient is not hyperactive.     Per HPI unless specifically indicated above     Objective:    BP (!) 126/92 (BP Location: Right Arm, Patient Position: Sitting, Cuff Size: Normal)   Pulse (!) 121   Temp 98.5 F (36.9 C)   Wt 212 lb 5 oz (96.3  kg)   SpO2 100%   BMI 34.69 kg/m   Wt Readings from Last 3 Encounters:  11/07/17 212 lb 5 oz (96.3 kg)  05/09/17 210 lb 3 oz (95.3 kg)  04/11/17 218 lb 5 oz (99 kg)    Physical Exam  Constitutional: She is oriented to person, place, and time. She appears well-developed and well-nourished. No distress.  HENT:  Head: Normocephalic and atraumatic.  Right Ear: Hearing normal.  Left Ear: Hearing normal.  Nose: Nose normal.  Eyes: Conjunctivae and lids are normal. Right eye exhibits no discharge. Left eye exhibits  no discharge. No scleral icterus.  Cardiovascular: Normal rate, regular rhythm, normal heart sounds and intact distal pulses. Exam reveals no gallop and no friction rub.  No murmur heard. Pulmonary/Chest: Effort normal and breath sounds normal. No stridor. No respiratory distress. She has no wheezes. She has no rales. She exhibits no tenderness.  Musculoskeletal: Normal range of motion.  Neurological: She is alert and oriented to person, place, and time.  Skin: Skin is warm, dry and intact. Capillary refill takes less than 2 seconds. No rash noted. She is not diaphoretic. No erythema. No pallor.  Psychiatric: Her speech is normal. Judgment and thought content normal. Her mood appears anxious. She is agitated. Cognition and memory are normal.  Nursing note and vitals reviewed.   Results for orders placed or performed in visit on 04/11/17  HIV antibody  Result Value Ref Range   HIV Screen 4th Generation wRfx Non Reactive Non Reactive      Assessment & Plan:   Problem List Items Addressed This Visit      Digestive   Gastroesophageal reflux disease    Has been off her medicine for 2 months. Not doing well. Will restart her medicine and recheck in 4-6 weeks. Call with any concerns.       Relevant Medications   omeprazole (PRILOSEC) 20 MG capsule     Other   Depression, major, recurrent, moderate (HCC) - Primary    Has been off her medicine for 2 months. Not doing well. Will restart her medicine and recheck in 4-6 weeks. Call with any concerns.       Relevant Medications   hydrOXYzine (ATARAX/VISTARIL) 25 MG tablet   sertraline (ZOLOFT) 25 MG tablet       Follow up plan: Return in about 1 month (around 12/05/2017) for Recheck mood.

## 2017-12-05 ENCOUNTER — Ambulatory Visit: Payer: 59 | Admitting: Family Medicine

## 2018-01-16 ENCOUNTER — Ambulatory Visit (INDEPENDENT_AMBULATORY_CARE_PROVIDER_SITE_OTHER): Payer: 59

## 2018-01-16 DIAGNOSIS — Z3042 Encounter for surveillance of injectable contraceptive: Secondary | ICD-10-CM | POA: Diagnosis not present

## 2018-04-10 ENCOUNTER — Ambulatory Visit (INDEPENDENT_AMBULATORY_CARE_PROVIDER_SITE_OTHER): Payer: 59

## 2018-04-10 DIAGNOSIS — Z3042 Encounter for surveillance of injectable contraceptive: Secondary | ICD-10-CM

## 2018-04-10 NOTE — Progress Notes (Signed)
Patient presented to clinic for depo injection. Administer in left deltoid. Patient tolerated well. Will return between 12/9-12/23

## 2018-04-21 ENCOUNTER — Ambulatory Visit: Payer: 59 | Admitting: Family Medicine

## 2018-04-21 ENCOUNTER — Encounter: Payer: Self-pay | Admitting: Family Medicine

## 2018-04-21 VITALS — BP 131/85 | HR 114 | Temp 98.5°F | Wt 210.2 lb

## 2018-04-21 DIAGNOSIS — F331 Major depressive disorder, recurrent, moderate: Secondary | ICD-10-CM

## 2018-04-21 DIAGNOSIS — Z23 Encounter for immunization: Secondary | ICD-10-CM | POA: Diagnosis not present

## 2018-04-21 NOTE — Patient Instructions (Signed)

## 2018-04-21 NOTE — Assessment & Plan Note (Signed)
Not taking her medicine. Feeling terrible. Discussed that she can stay off her medicine, see psychiatry, go to counseling or restart her medicine. She will restart her medicine. Restart zoloft 25mg . Recheck 1 month.

## 2018-04-21 NOTE — Progress Notes (Signed)
BP 131/85   Pulse (!) 114   Temp 98.5 F (36.9 C) (Oral)   Wt 210 lb 3.2 oz (95.3 kg)   SpO2 98%   BMI 34.34 kg/m    Subjective:    Patient ID: Maria Little, female    DOB: [birth year], 42 y.o.   MRN: [id number]  HPI: Maria Little is a 42 y.o. female  Chief Complaint  Patient presents with  . Depression   DEPRESSION- has not been taking her medicine. Feeling terrible. She states that she is feeling moody and "bitchy" Mood status: exacerbated Satisfied with current treatment?: no Symptom severity: moderate  Duration of current treatment : off and on Side effects: no Medication compliance: poor compliance Psychotherapy/counseling: no  Previous psychiatric medications: sertraline Depressed mood: yes Anxious mood: yes Anhedonia: no Significant weight loss or gain: no Insomnia: no  Fatigue: yes Feelings of worthlessness or guilt: yes Impaired concentration/indecisiveness: yes Suicidal ideations: no Hopelessness: yes Crying spells: yes Depression screen [facility] 2/9 04/21/2018 11/07/2017 05/09/2017 04/11/2017 03/14/2017  Decreased Interest 3 0 0 0 0  Down, Depressed, Hopeless 0 0 0 0 0  PHQ - 2 Score 3 0 0 0 0  Altered sleeping 2 2 0 3 2  Tired, decreased energy 2 1 0 0 0  Change in appetite 3 2 0 3 1  Feeling bad or failure about yourself  1 0 0 0 0  Trouble concentrating 1 0 0 0 0  Moving slowly or fidgety/restless 0 0 0 0 0  Suicidal thoughts 0 0 0 0 0  PHQ-9 Score 12 5 0 6 3  Difficult doing work/chores Very difficult Somewhat difficult Not difficult at all - -     Relevant past medical, surgical, family and social history reviewed and updated as indicated. Interim medical history since our last visit reviewed. Allergies and medications reviewed and updated.  Review of Systems  Constitutional: Negative.   Respiratory: Negative.   Cardiovascular: Negative.   Skin: Negative.   Psychiatric/Behavioral: Positive for agitation, behavioral problems and  dysphoric mood. Negative for confusion, decreased concentration, hallucinations, self-injury, sleep disturbance and suicidal ideas. The patient is nervous/anxious. The patient is not hyperactive.     Per HPI unless specifically indicated above     Objective:    BP 131/85   Pulse (!) 114   Temp 98.5 F (36.9 C) (Oral)   Wt 210 lb 3.2 oz (95.3 kg)   SpO2 98%   BMI 34.34 kg/m   Wt Readings from Last 3 Encounters:  04/21/18 210 lb 3.2 oz (95.3 kg)  11/07/17 212 lb 5 oz (96.3 kg)  05/09/17 210 lb 3 oz (95.3 kg)    Physical Exam  Constitutional: She is oriented to person, place, and time. She appears well-developed and well-nourished. No distress.  HENT:  Head: Normocephalic and atraumatic.  Right Ear: Hearing normal.  Left Ear: Hearing normal.  Nose: Nose normal.  Eyes: Conjunctivae and lids are normal. Right eye exhibits no discharge. Left eye exhibits no discharge. No scleral icterus.  Cardiovascular: Normal rate, regular rhythm, normal heart sounds and intact distal pulses. Exam reveals no gallop and no friction rub.  No murmur heard. Pulmonary/Chest: Effort normal and breath sounds normal. No stridor. No respiratory distress. She has no wheezes. She has no rales. She exhibits no tenderness.  Musculoskeletal: Normal range of motion.  Neurological: She is alert and oriented to person, place, and time.  Skin: Skin is dry and intact. Capillary refill takes less than 2  seconds. No rash noted. She is not diaphoretic. No erythema. No pallor.  Psychiatric: She has a normal mood and affect. Her speech is normal and behavior is normal. Judgment and thought content normal. Cognition and memory are normal.  Vitals reviewed.   Results for orders placed or performed in visit on 04/11/17  HIV antibody  Result Value Ref Range   HIV Screen 4th Generation wRfx Non Reactive Non Reactive      Assessment & Plan:   Problem List Items Addressed This Visit      Other   Depression, major,  recurrent, moderate (HCC) - Primary    Not taking her medicine. Feeling terrible. Discussed that she can stay off her medicine, see psychiatry, go to counseling or restart her medicine. She will restart her medicine. Restart zoloft 25mg . Recheck 1 month.        Other Visit Diagnoses    Need for influenza vaccination       Flu shot given today.   Relevant Orders   Flu Vaccine QUAD 36+ mos IM       Follow up plan: Return in about 4 weeks (around 05/19/2018).

## 2018-05-16 ENCOUNTER — Encounter: Payer: Self-pay | Admitting: Emergency Medicine

## 2018-05-16 ENCOUNTER — Emergency Department: Payer: Commercial Managed Care - HMO

## 2018-05-16 ENCOUNTER — Emergency Department
Admission: EM | Admit: 2018-05-16 | Discharge: 2018-05-16 | Disposition: A | Discharge status: Home / Self Care | Payer: Commercial Managed Care - HMO | Attending: Emergency Medicine | Admitting: Emergency Medicine

## 2018-05-16 DIAGNOSIS — N3090 Cystitis, unspecified without hematuria: Secondary | ICD-10-CM | POA: Insufficient documentation

## 2018-05-16 DIAGNOSIS — R739 Hyperglycemia, unspecified: Secondary | ICD-10-CM

## 2018-05-16 DIAGNOSIS — R17 Unspecified jaundice: Secondary | ICD-10-CM | POA: Diagnosis not present

## 2018-05-16 DIAGNOSIS — Z79899 Other long term (current) drug therapy: Secondary | ICD-10-CM | POA: Insufficient documentation

## 2018-05-16 DIAGNOSIS — G43801 Other migraine, not intractable, with status migrainosus: Secondary | ICD-10-CM

## 2018-05-16 DIAGNOSIS — G43809 Other migraine, not intractable, without status migrainosus: Secondary | ICD-10-CM | POA: Diagnosis not present

## 2018-05-16 DIAGNOSIS — E876 Hypokalemia: Secondary | ICD-10-CM

## 2018-05-16 DIAGNOSIS — N3000 Acute cystitis without hematuria: Secondary | ICD-10-CM | POA: Diagnosis not present

## 2018-05-16 DIAGNOSIS — R51 Headache: Secondary | ICD-10-CM | POA: Diagnosis present

## 2018-05-16 LAB — COMPREHENSIVE METABOLIC PANEL
ALT: 16 U/L (ref 0–44)
AST: 20 U/L (ref 15–41)
Albumin: 3.5 g/dL (ref 3.5–5.0)
Alkaline Phosphatase: 86 U/L (ref 38–126)
Anion gap: 11 (ref 5–15)
BUN: 8 mg/dL (ref 6–20)
CO2: 20 mmol/L — ABNORMAL LOW (ref 22–32)
Calcium: 8.8 mg/dL — ABNORMAL LOW (ref 8.9–10.3)
Chloride: 104 mmol/L (ref 98–111)
Creatinine, Ser: 0.9 mg/dL (ref 0.44–1.00)
GFR calc Af Amer: >60 mL/min (ref >60)
GFR calc non Af Amer: >60 mL/min (ref >60)
Glucose, Bld: 238 mg/dL — ABNORMAL HIGH (ref 70–99)
Potassium: 3.2 mmol/L — ABNORMAL LOW (ref 3.5–5.1)
Sodium: 135 mmol/L (ref 135–145)
Total Bilirubin: 2.1 mg/dL — ABNORMAL HIGH (ref 0.3–1.2)
Total Protein: 7.7 g/dL (ref 6.5–8.1)

## 2018-05-16 LAB — URINALYSIS, COMPLETE (UACMP) WITH MICROSCOPIC
BACTERIA UA: NONE SEEN
BILIRUBIN URINE: NEGATIVE
Glucose, UA: 50 mg/dL — AB
HGB URINE DIPSTICK: NEGATIVE
KETONES UR: 5 mg/dL — AB
NITRITE: POSITIVE — AB
PROTEIN: 30 mg/dL — AB
SPECIFIC GRAVITY, URINE: 1.015 (ref 1.005–1.030)
pH: 5 (ref 5.0–8.0)

## 2018-05-16 LAB — CBC
HEMATOCRIT: 39.2 % (ref 36.0–46.0)
Hemoglobin: 12.8 g/dL (ref 12.0–15.0)
MCH: 27.8 pg (ref 26.0–34.0)
MCHC: 32.7 g/dL (ref 30.0–36.0)
MCV: 85 fL (ref 80.0–100.0)
Platelets: 306 10*3/uL (ref 150–400)
RBC: 4.61 MIL/uL (ref 3.87–5.11)
RDW: 13.6 % (ref 11.5–15.5)
WBC: 18.3 10*3/uL — ABNORMAL HIGH (ref 4.0–10.5)
nRBC: 0 % (ref 0.0–0.2)

## 2018-05-16 LAB — POCT PREGNANCY, URINE: Preg Test, Ur: NEGATIVE

## 2018-05-16 MED ORDER — POTASSIUM CHLORIDE CRYS ER 20 MEQ PO TBCR
40.0000 meq | EXTENDED_RELEASE_TABLET | Freq: Once | ORAL | Status: AC
  Administered 2018-05-16: 40 meq via ORAL
  Filled 2018-05-16: qty 2

## 2018-05-16 MED ORDER — CEPHALEXIN 500 MG PO CAPS: 500.0000 mg | ORAL_CAPSULE | Freq: Four times a day (QID) | ORAL | 0 refills | Status: AC

## 2018-05-16 MED ORDER — KETOROLAC TROMETHAMINE 30 MG/ML IJ SOLN
30.0000 mg | Freq: Once | INTRAMUSCULAR | Status: AC
  Administered 2018-05-16: 30 mg via INTRAVENOUS
  Filled 2018-05-16: qty 1

## 2018-05-16 MED ORDER — CEPHALEXIN 500 MG PO CAPS
500.0000 mg | ORAL_CAPSULE | Freq: Once | ORAL | Status: AC
  Administered 2018-05-16: 500 mg via ORAL
  Filled 2018-05-16: qty 1

## 2018-05-16 MED ORDER — PROCHLORPERAZINE EDISYLATE 10 MG/2ML IJ SOLN
5.0000 mg | Freq: Once | INTRAMUSCULAR | Status: AC
  Administered 2018-05-16: 5 mg via INTRAVENOUS
  Filled 2018-05-16: qty 2

## 2018-05-16 MED ORDER — DIPHENHYDRAMINE HCL 50 MG/ML IJ SOLN
12.5000 mg | Freq: Once | INTRAMUSCULAR | Status: AC
  Administered 2018-05-16: 12.5 mg via INTRAVENOUS
  Filled 2018-05-16: qty 1

## 2018-05-16 MED ORDER — KETOROLAC TROMETHAMINE 10 MG PO TABS: 10.0000 mg | ORAL_TABLET | Freq: Three times a day (TID) | ORAL | 0 refills | Status: DC | PRN

## 2018-05-16 MED ORDER — SODIUM CHLORIDE 0.9 % IV BOLUS: 1000.0000 mL | Freq: Once | INTRAVENOUS | Status: DC

## 2018-05-16 MED ORDER — SODIUM CHLORIDE 0.9 % IV BOLUS
1000.0000 mL | Freq: Once | INTRAVENOUS | Status: AC
  Administered 2018-05-16: 1000 mL via INTRAVENOUS

## 2018-05-16 NOTE — ED Triage Notes (Signed)
Pt reports HA since yesterday am that will not go away. Pt reports has taken BC powders, 500mg  tylenol and 2 tylenol PM with no relief. Pt reports hx of migraines, states this one hurts all over he head. Pt also with low grade fever in triage. Denies cough, congestion, or other upper resp sx's.

## 2018-05-16 NOTE — Discharge Instructions (Addendum)
Please make an appointment with your primary care physician to have your potassium rechecked, and to be evaluated for your elevated blood sugar, which may be the sign of prediabetes or diabetes.  Take the entire course of antibiotics, even if you are feeling better.  Your primary care doctor can follow-up the results of your urine culture.  You may take Tylenol or Toradol for migraine.  If you take Toradol, do not take other NSAID medications including Aleve, Motrin, ibuprofen or Advil as these can irritate your stomach lining.  Return to the emergency department if you develop severe pain, lightheadedness or fainting, numbness tingling or weakness, changes in speech, vision or mental status, difficulty walking, or any other symptoms concerning to you.

## 2018-05-16 NOTE — ED Provider Notes (Signed)
[facility] Emergency Department Provider Note  ____________________________________________  Time seen: Approximately 8:00 AM  I have reviewed the triage vital signs and the nursing notes.   HISTORY  Chief Complaint Headache and Fever    HPI Maria Little is a 42 y.o. female a history of GERD and recurrent migraines, previous UTIs, presenting with headache.  The patient reports that she gets frequent migraines which she usually can control with over-the-counter medications at home.  Once every few years, she does require emergency department treatment for her headaches.  She reports that since 7 AM yesterday, she has had a progressively worsening headache which is most prominent behind the ears but is "all over."  This headache has been associated with photosensitivity, phonophobia.  She "felt warm,", but has not had a documented fever.  She denies any tick bites, neck stiffness or pain, nausea vomiting or abdominal pain, or urinary symptoms.  Past Medical History:  Diagnosis Date  . Migraine     Patient Active Problem List   Diagnosis Date Noted  . Hyperlipidemia 10/11/2016  . Depression, major, recurrent, moderate (HCC) 10/05/2016  . Gastroesophageal reflux disease 10/05/2016    Past Surgical History:  Procedure Laterality Date  . BREAST SURGERY     Lumpectomy    Current Outpatient Rx  . Order #: [id number] Class: Historical Med  . Order #: [id number] Class: Normal  . Order #: [id number] Class: Normal  . Order #: [id number] Class: Normal  . Order #: [id number] Class: Print  . Order #: [id number] Class: Print    Allergies Patient has no known allergies.  Family History  Problem Relation Age of Onset  . Diabetes Mother   . Hyperlipidemia Mother   . Hypertension Mother   . Hypertension Father   . Hypertension Brother     Social History Social History   Tobacco Use  . Smoking status: Never Smoker  . Smokeless tobacco: Never Used  Substance  Use Topics  . Alcohol use: Yes  . Drug use: No    Review of Systems Constitutional: No fever/chills.  "Felt warm."  Positive general malaise. Eyes: No visual changes.  No eye discharge. ENT: No sore throat. No congestion or rhinorrhea. Cardiovascular: Denies chest pain. Denies palpitations. Respiratory: Denies shortness of breath.  No cough. Gastrointestinal: No abdominal pain.  No nausea, no vomiting.  No diarrhea.  No constipation. Genitourinary: Negative for dysuria.  No urinary frequency. Musculoskeletal: Negative for back pain. Skin: Negative for rash. Neurological: Positive for headache. No focal numbness, tingling or weakness.     ____________________________________________   PHYSICAL EXAM:  VITAL SIGNS: ED Triage Vitals  Enc Vitals Group     BP 05/16/18 0454 (!) 118/99     Pulse Rate 05/16/18 0454 (!) 117     Resp 05/16/18 0454 20     Temp 05/16/18 0454 99 F (37.2 C)     Temp Source 05/16/18 0454 Oral     SpO2 05/16/18 0454 99 %     Weight 05/16/18 0455 210 lb (95.3 kg)     Height 05/16/18 0455 5\' 5"  (1.651 m)     Head Circumference --      Peak Flow --      Pain Score 05/16/18 0454 10     Pain Loc --      Pain Edu? --      Excl. in GC? --     Constitutional: Alert and oriented. Answers questions appropriately. Eyes: Conjunctivae are normal.  EOMI. No scleral icterus. Head: Atraumatic. Nose: No  congestion/rhinnorhea. Mouth/Throat: Mucous membranes are moist.  Neck: No stridor.  Supple.  No stiff neck or neck pain. Cardiovascular: Normal rate, regular rhythm. No murmurs, rubs or gallops.  Respiratory: Normal respiratory effort.  No accessory muscle use or retractions. Lungs CTAB.  No wheezes, rales or ronchi. Gastrointestinal: Overweight. Soft, nontender and nondistended.  No guarding or rebound.  No peritoneal signs. Musculoskeletal: No LE edema.  Neurologic:  A&Ox3.  Speech is clear.  Face and smile are symmetric.  EOMI. PERRLA.  No vertical or  horizontal nystagmus.  Moves all extremities well.  Normal gait without ataxia. Skin:  Skin is warm, dry and intact. No rash noted. Psychiatric: Mood and affect are normal. Speech and behavior are normal.  Normal judgement.  ____________________________________________   LABS (all labs ordered are listed, but only abnormal results are displayed)  Labs Reviewed  CBC - Abnormal; Notable for the following components:      Result Value   WBC 18.3 (*)    All other components within normal limits  COMPREHENSIVE METABOLIC PANEL - Abnormal; Notable for the following components:   Potassium 3.2 (*)    CO2 20 (*)    Glucose, Bld 238 (*)    Calcium 8.8 (*)    Total Bilirubin 2.1 (*)    All other components within normal limits  URINALYSIS, COMPLETE (UACMP) WITH MICROSCOPIC - Abnormal; Notable for the following components:   Color, Urine YELLOW (*)    APPearance HAZY (*)    Glucose, UA 50 (*)    Ketones, ur 5 (*)    Protein, ur 30 (*)    Nitrite POSITIVE (*)    Leukocytes, UA SMALL (*)    WBC, UA >50 (*)    Non Squamous Epithelial PRESENT (*)    All other components within normal limits  URINE CULTURE  POC URINE PREG, ED  POCT PREGNANCY, URINE   ____________________________________________  EKG  Not indicated ____________________________________________  RADIOLOGY  US Abdomen Limited Ruq  Result Date: 05/16/2018 CLINICAL DATA:  Elevated serum bilirubin EXAM: ULTRASOUND ABDOMEN LIMITED RIGHT UPPER QUADRANT COMPARISON:  None. FINDINGS: Gallbladder: No gallstones or wall thickening visualized. There is no pericholecystic fluid. No sonographic Murphy sign noted by sonographer. Common bile duct: Diameter: 3 mm. No intrahepatic or extrahepatic biliary duct dilatation. Liver: No focal lesion identified. Within normal limits in parenchymal echogenicity. Portal vein is patent on color Doppler imaging with normal direction of blood flow towards the liver. IMPRESSION: Study within normal  limits. Electronically Signed   By: Bretta Bang III M.D.   On: 05/16/2018 08:54    ____________________________________________   PROCEDURES  Procedure(s) performed: None  Procedures  Critical Care performed: No ____________________________________________   INITIAL IMPRESSION / ASSESSMENT AND PLAN / ED COURSE  Pertinent labs & imaging results that were available during my care of the patient were reviewed by me and considered in my medical decision making (see chart for details).  42 y.o. female with a history of migraines, GERD, presenting for progressively worsening migraine.  The patient's symptoms are most consistent with typical migraine.  She is not describing a thunderclap or worst headache of life and my suspicion for subarachnoid or intracerebral bleeding or stroke is very low; she has no focal neurologic symptoms on my examination.  Her laboratory studies, do have some abnormalities that I will follow-up.  She has an elevated white blood cell count and a urinalysis is pending.  Clinically, the patient is not exhibiting any signs or symptoms that would be consistent  with meningitis.  I will continue to monitor her throughout her ER stay.  She also has a bilirubin of 2.1 without any abdominal pain, nausea or vomiting; gallbladder or liver pathology is unlikely but an ultrasound of the right upper quadrant has been ordered.  In addition, the patient is mildly hypokalemic with a potassium of 3.2 which will be supplemented.  The patient does have a blood sugar of 238 today.  She has never been told she has diabetes in the past, and this is not a fasting blood sugar.  I have encouraged her to follow-up with her primary care physician tomorrow for further evaluation of her hyperglycemia.  She is not in DKA and has a normal anion gap.  Symptomatic treatment has been ordered.  Plan reevaluation for final disposition.  ----------------------------------------- 10:04 AM on  05/16/2018 -----------------------------------------  The patient is feeling significantly better at this time, has been ambulatory as well as able to drink clear liquids without any difficulty.  Her hypokalemia has been supplemented, she has been given intravenous fluids and counseled for her hyperglycemia, and she has received a dose of Keflex for her UTI.  Her headache is significantly better at this time.  I have talked to the patient extensively about a plan for follow-up with her PMD tomorrow, as well as return precautions.  At this time, the patient is safe for discharge home.  ____________________________________________  FINAL CLINICAL IMPRESSION(S) / ED DIAGNOSES  Final diagnoses:  Elevated bilirubin  Hypokalemia  Hyperglycemia  Other migraine with status migrainosus, not intractable  Acute cystitis without hematuria         NEW MEDICATIONS STARTED DURING THIS VISIT:  New Prescriptions   CEPHALEXIN (KEFLEX) 500 MG CAPSULE    Take 1 capsule (500 mg total) by mouth 4 (four) times daily for 7 days.   KETOROLAC (TORADOL) 10 MG TABLET    Take 1 tablet (10 mg total) by mouth every 8 (eight) hours as needed for moderate pain (with food).      Rockne Menghini, MD 05/16/18 1004

## 2018-05-18 ENCOUNTER — Encounter: Payer: Self-pay | Admitting: Family Medicine

## 2018-05-18 ENCOUNTER — Other Ambulatory Visit: Payer: Self-pay

## 2018-05-18 ENCOUNTER — Ambulatory Visit: Payer: 59 | Admitting: Family Medicine

## 2018-05-18 VITALS — BP 110/73 | HR 120 | Temp 99.7°F | Ht 65.0 in | Wt 205.0 lb

## 2018-05-18 DIAGNOSIS — R7309 Other abnormal glucose: Secondary | ICD-10-CM | POA: Diagnosis not present

## 2018-05-18 DIAGNOSIS — N390 Urinary tract infection, site not specified: Secondary | ICD-10-CM | POA: Diagnosis not present

## 2018-05-18 DIAGNOSIS — R509 Fever, unspecified: Secondary | ICD-10-CM

## 2018-05-18 DIAGNOSIS — G43909 Migraine, unspecified, not intractable, without status migrainosus: Secondary | ICD-10-CM

## 2018-05-18 LAB — URINE CULTURE

## 2018-05-18 MED ORDER — ONDANSETRON 4 MG PO TBDP: 4.0000 mg | ORAL_TABLET | Freq: Three times a day (TID) | ORAL | 0 refills | Status: DC | PRN

## 2018-05-18 MED ORDER — NITROFURANTOIN MONOHYD MACRO 100 MG PO CAPS: 100.0000 mg | ORAL_CAPSULE | Freq: Two times a day (BID) | ORAL | 0 refills | Status: DC

## 2018-05-18 MED ORDER — SUMATRIPTAN SUCCINATE 6 MG/0.5ML ~~LOC~~ SOLN
6.0000 mg | Freq: Once | SUBCUTANEOUS | Status: AC
  Administered 2018-05-18: 6 mg via SUBCUTANEOUS

## 2018-05-18 NOTE — Progress Notes (Signed)
BP 110/73   Pulse (!) 120   Temp 99.7 F (37.6 C) (Oral)   Ht 5\' 5"  (1.651 m)   Wt 205 lb (93 kg)   SpO2 99%   BMI 34.11 kg/m    Subjective:    Patient ID: Maria Little, female    DOB: [birth year], 42 y.o.   MRN: [id number]  HPI: Maria Little is a 42 y.o. female  Chief Complaint  Patient presents with  . Hospitalization Follow-up    pt states went to the ER last Tuesday with a headache/ pt requestes glucose check   Here today for ER f/u for UTI and persistent migraine. WBC count elevated in ER at 18,000, glucose elevated at 238, and U/A + for UTI with e coli growing out in culture. Was started on keflex and toradol PO, states she isn't feeling better and still having fevers especially at night. Urinary sxs seem to be improving some but not headaches. Toradol not helping at all. Denies hematuria, N/V/D, syncope, confusion.   Relevant past medical, surgical, family and social history reviewed and updated as indicated. Interim medical history since our last visit reviewed. Allergies and medications reviewed and updated.  Review of Systems  Per HPI unless specifically indicated above     Objective:    BP 110/73   Pulse (!) 120   Temp 99.7 F (37.6 C) (Oral)   Ht 5\' 5"  (1.651 m)   Wt 205 lb (93 kg)   SpO2 99%   BMI 34.11 kg/m   Wt Readings from Last 3 Encounters:  05/18/18 205 lb (93 kg)  05/16/18 210 lb (95.3 kg)  04/21/18 210 lb 3.2 oz (95.3 kg)    Physical Exam  Constitutional: She is oriented to person, place, and time. She appears well-developed and well-nourished. No distress.  HENT:  Head: Atraumatic.  Eyes: Conjunctivae and EOM are normal.  Neck: Normal range of motion. Neck supple.  Cardiovascular: Regular rhythm and normal heart sounds.  Tachycardic, but at her baseline per record review  Pulmonary/Chest: Effort normal and breath sounds normal.  Abdominal: Soft. Bowel sounds are normal. There is no tenderness.  Musculoskeletal: Normal range of  motion.  Neurological: She is alert and oriented to person, place, and time. No cranial nerve deficit.  Skin: Skin is warm and dry. She is not diaphoretic.  Psychiatric: She has a normal mood and affect. Her behavior is normal.  Nursing note and vitals reviewed.   Results for orders placed or performed in visit on 05/18/18  Urine Culture  Result Value Ref Range   Urine Culture, Routine Final report    Organism ID, Bacteria Comment   Microscopic Examination  Result Value Ref Range   WBC, UA 11-30 (A) 0 - 5 /hpf   RBC, UA 0-2 0 - 2 /hpf   Epithelial Cells (non renal) 0-10 0 - 10 /hpf   Mucus, UA Present Not Estab.   Bacteria, UA Few None seen/Few  CBC With Differential/Platelet  Result Value Ref Range   WBC 11.5 (H) 3.4 - 10.8 x10E3/uL   RBC 4.49 3.77 - 5.28 x10E6/uL   Hemoglobin 12.7 11.1 - 15.9 g/dL   Hematocrit 40.9 81.1 - 46.6 %   MCV 85 79 - 97 fL   MCH 28.3 26.6 - 33.0 pg   MCHC 33.2 31.5 - 35.7 g/dL   RDW 91.4 78.2 - 95.6 %   Platelets 332 150 - 450 x10E3/uL   Neutrophils 63 Not Estab. %   Lymphs  25 Not Estab. %   MID 13 Not Estab. %   Neutrophils Absolute 7.2 (H) 1.4 - 7.0 x10E3/uL   Lymphocytes Absolute 2.9 0.7 - 3.1 x10E3/uL   MID (Absolute) 1.4 0.1 - 1.6 X10E3/uL  UA/M w/rflx Culture, Routine  Result Value Ref Range   Specific Gravity, UA 1.010 1.005 - 1.030   pH, UA 6.5 5.0 - 7.5   Color, UA Yellow Yellow   Appearance Ur Cloudy (A) Clear   Leukocytes, UA 1+ (A) Negative   Protein, UA Trace (A) Negative/Trace   Glucose, UA Negative Negative   Ketones, UA 1+ (A) Negative   RBC, UA Trace (A) Negative   Bilirubin, UA Negative Negative   Urobilinogen, Ur 2.0 (H) 0.2 - 1.0 mg/dL   Nitrite, UA Negative Negative   Microscopic Examination See below:       Assessment & Plan:   Problem List Items Addressed This Visit    None    Visit Diagnoses    Acute lower UTI    -  Primary   U/A today improved from previous. Switch to macrobid, repeat urine culture. Push  fluids   Relevant Medications   nitrofurantoin, macrocrystal-monohydrate, (MACROBID) 100 MG capsule   Other Relevant Orders   UA/M w/rflx Culture, Routine (Completed)   Urine Culture (Completed)   Fever, unspecified fever cause       OTC fever reducers, push fluids, rest. F/u if not improving   Relevant Orders   CBC With Differential/Platelet (Completed)   Migraine without status migrainosus, not intractable, unspecified migraine type       IM imitrex given today, continue toradol and tylenol prn, rest, hydrate. Hoping once UTI improves migraine will subside   Relevant Medications   SUMAtriptan (IMITREX) injection 6 mg (Completed)   Elevated glucose       Recheck at upcoming f/u once infection has cleared. Previous glucose readings have been normal.     CBC and U/A improved from ER results. Willcontinue to monitor closely for improvement. Strict return precautions given, pt voiced understanding.   Follow up plan: Return for as scheduled in 2 weeks.

## 2018-05-19 LAB — CBC WITH DIFFERENTIAL/PLATELET
HEMATOCRIT: 38.2 % (ref 34.0–46.6)
Hemoglobin: 12.7 g/dL (ref 11.1–15.9)
Lymphocytes Absolute: 2.9 10*3/uL (ref 0.7–3.1)
Lymphs: 25 %
MCH: 28.3 pg (ref 26.6–33.0)
MCHC: 33.2 g/dL (ref 31.5–35.7)
MCV: 85 fL (ref 79–97)
MID (Absolute): 1.4 10*3/uL (ref 0.1–1.6)
MID: 13 %
NEUTROS ABS: 7.2 10*3/uL — AB (ref 1.4–7.0)
Neutrophils: 63 %
Platelets: 332 10*3/uL (ref 150–450)
RBC: 4.49 x10E6/uL (ref 3.77–5.28)
RDW: 14.3 % (ref 12.3–15.4)
WBC: 11.5 10*3/uL — AB (ref 3.4–10.8)

## 2018-05-19 LAB — MICROSCOPIC EXAMINATION

## 2018-05-19 LAB — UA/M W/RFLX CULTURE, ROUTINE
BILIRUBIN UA: NEGATIVE
GLUCOSE, UA: NEGATIVE
Nitrite, UA: NEGATIVE
SPEC GRAV UA: 1.01 (ref 1.005–1.030)
Urobilinogen, Ur: 2 mg/dL — ABNORMAL HIGH (ref 0.2–1.0)
pH, UA: 6.5 (ref 5.0–7.5)

## 2018-05-20 LAB — URINE CULTURE

## 2018-05-21 NOTE — Patient Instructions (Signed)
Follow-up in 2 weeks

## 2018-05-31 ENCOUNTER — Encounter: Payer: Self-pay | Admitting: Family Medicine

## 2018-06-02 ENCOUNTER — Ambulatory Visit: Payer: 59 | Admitting: Family Medicine

## 2018-06-24 ENCOUNTER — Encounter: Payer: Self-pay | Admitting: Family Medicine

## 2018-06-27 ENCOUNTER — Encounter: Payer: Self-pay | Admitting: Family Medicine

## 2018-06-27 ENCOUNTER — Ambulatory Visit: Payer: 59 | Admitting: Family Medicine

## 2018-06-27 VITALS — BP 110/74 | HR 81 | Wt 202.0 lb

## 2018-06-27 DIAGNOSIS — Z3042 Encounter for surveillance of injectable contraceptive: Secondary | ICD-10-CM

## 2018-06-27 DIAGNOSIS — M25562 Pain in left knee: Secondary | ICD-10-CM | POA: Diagnosis not present

## 2018-06-27 MED ORDER — MEDROXYPROGESTERONE ACETATE 150 MG/ML IM SUSP
150.0000 mg | INTRAMUSCULAR | Status: AC
  Administered 2018-06-27 – 2019-03-08 (×3): 150 mg via INTRAMUSCULAR

## 2018-06-27 MED ORDER — NAPROXEN 500 MG PO TABS: 500.0000 mg | ORAL_TABLET | Freq: Two times a day (BID) | ORAL | 0 refills | Status: DC

## 2018-06-27 NOTE — Patient Instructions (Signed)

## 2018-06-27 NOTE — Progress Notes (Signed)
Pt was administered depo. Next depo range is 09/12/18-09/27/18

## 2018-06-27 NOTE — Progress Notes (Signed)
BP 110/74   Pulse 81   Wt 202 lb (91.6 kg)   SpO2 96%   BMI 33.61 kg/m    Subjective:    Patient ID: Maria Little, female    DOB: [birth year], 42 y.o.   MRN: [id number]  HPI: Maria Little is a 42 y.o. female  Chief Complaint  Patient presents with  . Knee Pain    Right. since before thanksgiving.   . Depo    WOuld like next depo. Need order   KNEE PAIN Duration: 3 weeks Involved knee: left Mechanism of injury: unknown Location:anterior Onset: sudden Severity: moderate  Quality:  Aching and sore  Frequency: with movement Radiation: into her shin Aggravating factors: movement   Alleviating factors: nothing- tried one of her mom's tramadol   Status: stable Treatments attempted: tiger balm, tylenol, toradol   Relief with NSAIDs?:  No NSAIDs Taken Weakness with weight bearing or walking: no Sensation of giving way: no Locking: no Popping: yes Bruising: no Swelling: yes Redness: no Paresthesias/decreased sensation: no Fevers: no  Relevant past medical, surgical, family and social history reviewed and updated as indicated. Interim medical history since our last visit reviewed. Allergies and medications reviewed and updated.  Review of Systems  Constitutional: Negative.   Respiratory: Negative.   Cardiovascular: Negative.   Musculoskeletal: Positive for arthralgias, gait problem and joint swelling. Negative for back pain, myalgias, neck pain and neck stiffness.  Skin: Negative.   Allergic/Immunologic: Negative.   Psychiatric/Behavioral: Negative.     Per HPI unless specifically indicated above     Objective:    BP 110/74   Pulse 81   Wt 202 lb (91.6 kg)   SpO2 96%   BMI 33.61 kg/m   Wt Readings from Last 3 Encounters:  06/27/18 202 lb (91.6 kg)  05/18/18 205 lb (93 kg)  05/16/18 210 lb (95.3 kg)    Physical Exam  Constitutional: She is oriented to person, place, and time. She appears well-developed and well-nourished. No distress.  HENT:   Head: Normocephalic and atraumatic.  Right Ear: Hearing normal.  Left Ear: Hearing normal.  Nose: Nose normal.  Eyes: Conjunctivae and lids are normal. Right eye exhibits no discharge. Left eye exhibits no discharge. No scleral icterus.  Cardiovascular: Normal rate, regular rhythm, normal heart sounds and intact distal pulses. Exam reveals no gallop and no friction rub.  No murmur heard. Pulmonary/Chest: Effort normal and breath sounds normal. No stridor. No respiratory distress. She has no wheezes. She has no rales. She exhibits no tenderness.  Musculoskeletal: She exhibits edema and tenderness. She exhibits no deformity.  Mild swelling of L knee, negative anterior and posterior drawer tests, negative mcmurray's, negative appley's compression and distraction   Neurological: She is alert and oriented to person, place, and time.  Skin: Skin is warm, dry and intact. Capillary refill takes less than 2 seconds. No rash noted. She is not diaphoretic. No erythema. No pallor.  Psychiatric: She has a normal mood and affect. Her speech is normal and behavior is normal. Judgment and thought content normal. Cognition and memory are normal.  Nursing note and vitals reviewed.   Results for orders placed or performed in visit on 05/18/18  Urine Culture  Result Value Ref Range   Urine Culture, Routine Final report    Organism ID, Bacteria Comment   Microscopic Examination  Result Value Ref Range   WBC, UA 11-30 (A) 0 - 5 /hpf   RBC, UA 0-2 0 - 2 /hpf  Epithelial Cells (non renal) 0-10 0 - 10 /hpf   Mucus, UA Present Not Estab.   Bacteria, UA Few None seen/Few  CBC With Differential/Platelet  Result Value Ref Range   WBC 11.5 (H) 3.4 - 10.8 x10E3/uL   RBC 4.49 3.77 - 5.28 x10E6/uL   Hemoglobin 12.7 11.1 - 15.9 g/dL   Hematocrit 40.9 81.1 - 46.6 %   MCV 85 79 - 97 fL   MCH 28.3 26.6 - 33.0 pg   MCHC 33.2 31.5 - 35.7 g/dL   RDW 91.4 78.2 - 95.6 %   Platelets 332 150 - 450 x10E3/uL    Neutrophils 63 Not Estab. %   Lymphs 25 Not Estab. %   MID 13 Not Estab. %   Neutrophils Absolute 7.2 (H) 1.4 - 7.0 x10E3/uL   Lymphocytes Absolute 2.9 0.7 - 3.1 x10E3/uL   MID (Absolute) 1.4 0.1 - 1.6 X10E3/uL  UA/M w/rflx Culture, Routine  Result Value Ref Range   Specific Gravity, UA 1.010 1.005 - 1.030   pH, UA 6.5 5.0 - 7.5   Color, UA Yellow Yellow   Appearance Ur Cloudy (A) Clear   Leukocytes, UA 1+ (A) Negative   Protein, UA Trace (A) Negative/Trace   Glucose, UA Negative Negative   Ketones, UA 1+ (A) Negative   RBC, UA Trace (A) Negative   Bilirubin, UA Negative Negative   Urobilinogen, Ur 2.0 (H) 0.2 - 1.0 mg/dL   Nitrite, UA Negative Negative   Microscopic Examination See below:       Assessment & Plan:   Problem List Items Addressed This Visit    None    Visit Diagnoses    Acute pain of left knee    -  Primary   Will check labs. Await results. Naproxen and stretches. Recheck 3 weeks, if not better, x-ray. Call with any concerns.    Relevant Orders   Comprehensive metabolic panel   Uric acid   TSH   Lyme Ab/Western Blot Reflex   Babesia microti Antibody Panel   Rocky mtn spotted fvr abs pnl(IgG+IgM)   Ehrlichia Antibody Panel   Encounter for surveillance of injectable contraceptive       Depo given today   Relevant Medications   medroxyPROGESTERone (DEPO-PROVERA) injection 150 mg (Start on 06/27/2018  4:30 PM)       Follow up plan: Return in about 3 weeks (around 07/18/2018) for follow up knee.

## 2018-06-29 LAB — COMPREHENSIVE METABOLIC PANEL
ALBUMIN: 4.2 g/dL (ref 3.5–5.5)
ALK PHOS: 81 IU/L (ref 39–117)
ALT: 19 IU/L (ref 0–32)
AST: 19 IU/L (ref 0–40)
Albumin/Globulin Ratio: 1.1 — ABNORMAL LOW (ref 1.2–2.2)
BUN / CREAT RATIO: 5 — AB (ref 9–23)
BUN: 4 mg/dL — AB (ref 6–24)
Bilirubin Total: 0.4 mg/dL (ref 0.0–1.2)
CHLORIDE: 98 mmol/L (ref 96–106)
CO2: 23 mmol/L (ref 20–29)
Calcium: 9.6 mg/dL (ref 8.7–10.2)
Creatinine, Ser: 0.79 mg/dL (ref 0.57–1.00)
GFR calc Af Amer: 108 mL/min/{1.73_m2} (ref >59)
GFR, EST NON AFRICAN AMERICAN: 93 mL/min/{1.73_m2} (ref >59)
Globulin, Total: 3.7 g/dL (ref 1.5–4.5)
Glucose: 155 mg/dL — ABNORMAL HIGH (ref 65–99)
POTASSIUM: 4 mmol/L (ref 3.5–5.2)
SODIUM: 138 mmol/L (ref 134–144)
TOTAL PROTEIN: 7.9 g/dL (ref 6.0–8.5)

## 2018-06-29 LAB — EHRLICHIA ANTIBODY PANEL
E. CHAFFEENSIS (HME) IGM TITER: NEGATIVE
E. CHAFFEENSIS IGG AB: NEGATIVE
HGE IgG Titer: NEGATIVE
HGE IgM Titer: NEGATIVE

## 2018-06-29 LAB — URIC ACID: Uric Acid: 7.6 mg/dL — ABNORMAL HIGH (ref 2.5–7.1)

## 2018-06-29 LAB — TSH: TSH: 0.841 u[IU]/mL (ref 0.450–4.500)

## 2018-06-29 LAB — LYME AB/WESTERN BLOT REFLEX: Lyme IgG/IgM Ab: <0.91 {ISR} (ref 0.00–0.90)

## 2018-06-29 LAB — ROCKY MTN SPOTTED FVR ABS PNL(IGG+IGM)
RMSF IGG: NEGATIVE
RMSF IGM: 0.3 {index} (ref 0.00–0.89)

## 2018-06-29 LAB — BABESIA MICROTI ANTIBODY PANEL: Babesia microti IgM: <1:10 {titer}

## 2018-07-04 ENCOUNTER — Encounter: Payer: Self-pay | Admitting: Family Medicine

## 2018-07-04 MED ORDER — OMEPRAZOLE 20 MG PO CPDR: 20.0000 mg | DELAYED_RELEASE_CAPSULE | Freq: Every day | ORAL | 1 refills | Status: DC

## 2018-07-23 ENCOUNTER — Encounter: Payer: Self-pay | Admitting: Family Medicine

## 2018-07-25 ENCOUNTER — Telehealth: Payer: Self-pay | Admitting: Family Medicine

## 2018-07-25 NOTE — Telephone Encounter (Signed)
Contacted patient on 07/24/18 @ 7:24p.m. regarding questions about visit on 05/18/18.  Patient is feeling well and has no issues regarding medical treatment. Patient's concerns were related to charges and insurance denial for visit.

## 2018-09-06 ENCOUNTER — Encounter: Payer: Self-pay | Admitting: Family Medicine

## 2018-09-15 ENCOUNTER — Other Ambulatory Visit: Payer: Self-pay

## 2018-09-15 ENCOUNTER — Encounter: Payer: Self-pay | Admitting: Family Medicine

## 2018-09-15 ENCOUNTER — Ambulatory Visit: Payer: 59 | Admitting: Family Medicine

## 2018-09-15 VITALS — BP 133/91 | HR 107 | Temp 98.7°F | Ht 65.0 in | Wt 199.0 lb

## 2018-09-15 DIAGNOSIS — Z3042 Encounter for surveillance of injectable contraceptive: Secondary | ICD-10-CM | POA: Diagnosis not present

## 2018-09-15 DIAGNOSIS — F331 Major depressive disorder, recurrent, moderate: Secondary | ICD-10-CM

## 2018-09-15 MED ORDER — SERTRALINE HCL 25 MG PO TABS: ORAL_TABLET | ORAL | 3 refills | Status: DC

## 2018-09-15 NOTE — Assessment & Plan Note (Signed)
Will restart her zoloft and recheck 1 month. Call with any concerns.

## 2018-09-15 NOTE — Progress Notes (Signed)
BP (!) 133/91   Pulse (!) 107   Temp 98.7 F (37.1 C) (Oral)   Ht 5\' 5"  (1.651 m)   Wt 199 lb (90.3 kg)   SpO2 98%   BMI 33.12 kg/m    Subjective:    Patient ID: Maria Little, female    DOB: 01/16/1976, 43 y.o.   MRN: [id number]  HPI: Maria Little is a 43 y.o. female  Chief Complaint  Patient presents with  . Depression     discuss zoloft, atarax  . Anxiety   ANXIETY/DEPRESSION- she is very non-compliant with her medication. Was to restart her medicine again in October. Stopped it again by December Duration: chronic Status:exacerbated Anxious mood: yes  Excessive worrying: yes Irritability: yes  Sweating: no Nausea: no Palpitations:no Hyperventilation: no Panic attacks: no Agoraphobia: no  Obscessions/compulsions: no Depressed mood: yes Depression screen [facility] 2/9 09/15/2018 05/18/2018 04/21/2018 11/07/2017 05/09/2017  Decreased Interest 2 0 3 0 0  Down, Depressed, Hopeless 0 0 0 0 0  PHQ - 2 Score 2 0 3 0 0  Altered sleeping 2 0 2 2 0  Tired, decreased energy 1 1 2 1  0  Change in appetite 1 2 3 2  0  Feeling bad or failure about yourself  0 0 1 0 0  Trouble concentrating 2 0 1 0 0  Moving slowly or fidgety/restless 0 0 0 0 0  Suicidal thoughts 0 0 0 0 0  PHQ-9 Score 8 3 12 5  0  Difficult doing work/chores Very difficult - Very difficult Somewhat difficult Not difficult at all  Some recent data might be hidden   GAD 7 : Generalized Anxiety Score 09/15/2018 11/07/2017 05/09/2017 03/14/2017  Nervous, Anxious, on Edge 0 0 0 0  Control/stop worrying 3 1 1 2   Worry too much - different things 3 2 1 3   Trouble relaxing 3 1 1 1   Restless 2 0 0 0  Easily annoyed or irritable 3 2 0 3  Afraid - awful might happen 3 1 0 1  Total GAD 7 Score 17 7 3 10   Anxiety Difficulty Very difficult Very difficult Somewhat difficult Somewhat difficult   Anhedonia: yes Weight changes: no Insomnia: no   Hypersomnia: no Fatigue/loss of energy: yes Feelings of worthlessness:  yes Feelings of guilt: yes Impaired concentration/indecisiveness: yes Suicidal ideations: no  Crying spells: yes Recent Stressors/Life Changes: yes   Relationship problems: yes   Family stress: yes     Financial stress: yes    Job stress: yes    Recent death/loss: no  Relevant past medical, surgical, family and social history reviewed and updated as indicated. Interim medical history since our last visit reviewed. Allergies and medications reviewed and updated.  Review of Systems  Constitutional: Negative.   Respiratory: Negative.   Cardiovascular: Negative.   Skin: Negative.   Psychiatric/Behavioral: Positive for dysphoric mood. Negative for agitation, behavioral problems, confusion, decreased concentration, hallucinations, self-injury, sleep disturbance and suicidal ideas. The patient is nervous/anxious. The patient is not hyperactive.     Per HPI unless specifically indicated above     Objective:    BP (!) 133/91   Pulse (!) 107   Temp 98.7 F (37.1 C) (Oral)   Ht 5\' 5"  (1.651 m)   Wt 199 lb (90.3 kg)   SpO2 98%   BMI 33.12 kg/m   Wt Readings from Last 3 Encounters:  09/15/18 199 lb (90.3 kg)  06/27/18 202 lb (91.6 kg)  05/18/18 205 lb (93 kg)  Physical Exam Vitals signs and nursing note reviewed.  Constitutional:      General: She is not in acute distress.    Appearance: Normal appearance. She is not ill-appearing, toxic-appearing or diaphoretic.  HENT:     Head: Normocephalic and atraumatic.     Right Ear: External ear normal.     Left Ear: External ear normal.     Nose: Nose normal.     Mouth/Throat:     Mouth: Mucous membranes are moist.     Pharynx: Oropharynx is clear.  Eyes:     General: No scleral icterus.       Right eye: No discharge.        Left eye: No discharge.     Extraocular Movements: Extraocular movements intact.     Conjunctiva/sclera: Conjunctivae normal.     Pupils: Pupils are equal, round, and reactive to light.  Neck:      Musculoskeletal: Normal range of motion and neck supple.  Cardiovascular:     Rate and Rhythm: Normal rate and regular rhythm.     Pulses: Normal pulses.     Heart sounds: Normal heart sounds. No murmur. No friction rub. No gallop.   Pulmonary:     Effort: Pulmonary effort is normal. No respiratory distress.     Breath sounds: Normal breath sounds. No stridor. No wheezing, rhonchi or rales.  Chest:     Chest wall: No tenderness.  Musculoskeletal: Normal range of motion.  Skin:    General: Skin is warm and dry.     Capillary Refill: Capillary refill takes less than 2 seconds.     Coloration: Skin is not jaundiced or pale.     Findings: No bruising, erythema, lesion or rash.  Neurological:     General: No focal deficit present.     Mental Status: She is alert and oriented to person, place, and time. Mental status is at baseline.  Psychiatric:        Mood and Affect: Mood normal.        Behavior: Behavior normal.        Thought Content: Thought content normal.        Judgment: Judgment normal.     Results for orders placed or performed in visit on 06/27/18  Comprehensive metabolic panel  Result Value Ref Range   Glucose 155 (H) 65 - 99 mg/dL   BUN 4 (L) 6 - 24 mg/dL   Creatinine, Ser 2.87 0.57 - 1.00 mg/dL   GFR calc non Af Amer 93 >59 mL/min/1.73   GFR calc Af Amer 108 >59 mL/min/1.73   BUN/Creatinine Ratio 5 (L) 9 - 23   Sodium 138 134 - 144 mmol/L   Potassium 4.0 3.5 - 5.2 mmol/L   Chloride 98 96 - 106 mmol/L   CO2 23 20 - 29 mmol/L   Calcium 9.6 8.7 - 10.2 mg/dL   Total Protein 7.9 6.0 - 8.5 g/dL   Albumin 4.2 3.5 - 5.5 g/dL   Globulin, Total 3.7 1.5 - 4.5 g/dL   Albumin/Globulin Ratio 1.1 (L) 1.2 - 2.2   Bilirubin Total 0.4 0.0 - 1.2 mg/dL   Alkaline Phosphatase 81 39 - 117 IU/L   AST 19 0 - 40 IU/L   ALT 19 0 - 32 IU/L  Uric acid  Result Value Ref Range   Uric Acid 7.6 (H) 2.5 - 7.1 mg/dL  TSH  Result Value Ref Range   TSH 0.841 0.450 - 4.500 uIU/mL  Lyme  Ab/Western Blot  Reflex  Result Value Ref Range   Lyme IgG/IgM Ab <0.91 0.00 - 0.90 ISR   LYME DISEASE AB, QUANT, IGM <0.80 0.00 - 0.79 index  Babesia microti Antibody Panel  Result Value Ref Range   Babesia microti IgM <1:10 Neg:<1:10   Babesia microti IgG <1:10 Neg:<1:10  Rocky mtn spotted fvr abs pnl(IgG+IgM)  Result Value Ref Range   RMSF IgG Negative Negative   RMSF IgM 0.30 0.00 - 0.89 index  Ehrlichia Antibody Panel  Result Value Ref Range   E.Chaffeensis (HME) IgG Negative Neg:<1:64   E. Chaffeensis (HME) IgM Titer Negative Neg:<1:20   HGE IgG Titer Negative Neg:<1:64   HGE IgM Titer Negative Neg:<1:20      Assessment & Plan:   Problem List Items Addressed This Visit      Other   Depression, major, recurrent, moderate (HCC) - Primary    Will restart her zoloft and recheck 1 month. Call with any concerns.       Relevant Medications   sertraline (ZOLOFT) 25 MG tablet       Follow up plan: Return in about 4 weeks (around 10/13/2018) for follow up mood.

## 2018-09-18 ENCOUNTER — Ambulatory Visit: Payer: Self-pay | Admitting: Family Medicine

## 2018-10-13 ENCOUNTER — Ambulatory Visit: Payer: 59 | Admitting: Family Medicine

## 2018-12-11 ENCOUNTER — Other Ambulatory Visit: Payer: Self-pay

## 2018-12-11 ENCOUNTER — Encounter: Payer: Self-pay | Admitting: Emergency Medicine

## 2018-12-11 ENCOUNTER — Emergency Department: Payer: 59

## 2018-12-11 ENCOUNTER — Emergency Department
Admission: EM | Admit: 2018-12-11 | Discharge: 2018-12-11 | Disposition: A | Discharge status: Home / Self Care | Payer: 59 | Attending: Emergency Medicine | Admitting: Emergency Medicine

## 2018-12-11 DIAGNOSIS — R079 Chest pain, unspecified: Secondary | ICD-10-CM | POA: Diagnosis present

## 2018-12-11 DIAGNOSIS — R0789 Other chest pain: Secondary | ICD-10-CM | POA: Insufficient documentation

## 2018-12-11 DIAGNOSIS — M549 Dorsalgia, unspecified: Secondary | ICD-10-CM | POA: Insufficient documentation

## 2018-12-11 LAB — BASIC METABOLIC PANEL
Anion gap: 11 (ref 5–15)
BUN: 9 mg/dL (ref 6–20)
CO2: 23 mmol/L (ref 22–32)
Calcium: 9.1 mg/dL (ref 8.9–10.3)
Chloride: 102 mmol/L (ref 98–111)
Creatinine, Ser: 0.78 mg/dL (ref 0.44–1.00)
GFR calc Af Amer: >60 mL/min (ref >60)
GFR calc non Af Amer: >60 mL/min (ref >60)
Glucose, Bld: 182 mg/dL — ABNORMAL HIGH (ref 70–99)
Potassium: 2.9 mmol/L — ABNORMAL LOW (ref 3.5–5.1)
Sodium: 136 mmol/L (ref 135–145)

## 2018-12-11 LAB — CBC
HCT: 42.2 % (ref 36.0–46.0)
Hemoglobin: 13.5 g/dL (ref 12.0–15.0)
MCH: 28 pg (ref 26.0–34.0)
MCHC: 32 g/dL (ref 30.0–36.0)
MCV: 87.6 fL (ref 80.0–100.0)
Platelets: 409 10*3/uL — ABNORMAL HIGH (ref 150–400)
RBC: 4.82 MIL/uL (ref 3.87–5.11)
RDW: 13.7 % (ref 11.5–15.5)
WBC: 8.7 10*3/uL (ref 4.0–10.5)
nRBC: 0 % (ref 0.0–0.2)

## 2018-12-11 LAB — FIBRIN DERIVATIVES D-DIMER (ARMC ONLY): Fibrin derivatives D-dimer (ARMC): 338.63 ng/mL (FEU) (ref 0.00–499.00)

## 2018-12-11 LAB — TROPONIN I: Troponin I: <0.03 ng/mL (ref <0.03)

## 2018-12-11 MED ORDER — DIAZEPAM 5 MG PO TABS: 5.0000 mg | ORAL_TABLET | Freq: Three times a day (TID) | ORAL | 0 refills | Status: DC | PRN

## 2018-12-11 MED ORDER — IBUPROFEN 800 MG PO TABS
800.0000 mg | ORAL_TABLET | Freq: Once | ORAL | Status: AC
  Administered 2018-12-11: 800 mg via ORAL
  Filled 2018-12-11: qty 1

## 2018-12-11 MED ORDER — IBUPROFEN 800 MG PO TABS: 800.0000 mg | ORAL_TABLET | Freq: Three times a day (TID) | ORAL | 0 refills | Status: DC | PRN

## 2018-12-11 MED ORDER — DIAZEPAM 5 MG PO TABS
10.0000 mg | ORAL_TABLET | Freq: Once | ORAL | Status: AC
  Administered 2018-12-11: 10 mg via ORAL
  Filled 2018-12-11: qty 2

## 2018-12-11 NOTE — ED Triage Notes (Signed)
Pt reports chest pain to the center of her chest since last Saturday. Pt reports pain description depends on what she is doing. Pt denies SOB, and pain that radiates.

## 2018-12-11 NOTE — ED Provider Notes (Signed)
[facility] Emergency Department Provider Note       Time seen: ----------------------------------------- 3:28 PM on 12/11/2018 -----------------------------------------   I have reviewed the triage vital signs and the nursing notes.  HISTORY   Chief Complaint Chest Pain    HPI Maria Little is a 43 y.o. female with a history of migraine who presents to the ED for chest pain in the center of her chest since last Saturday.  Maria Little reports the pain description depends on what Maria Little is doing, denies shortness of breath.  Pain is currently 8 out of 10 in the mid chest.  Maria Little also has some back pain.  Past Medical History:  Diagnosis Date  . Migraine     Patient Active Problem List   Diagnosis Date Noted  . Hyperlipidemia 10/11/2016  . Depression, major, recurrent, moderate (HCC) 10/05/2016  . Gastroesophageal reflux disease 10/05/2016    Past Surgical History:  Procedure Laterality Date  . BREAST SURGERY     Lumpectomy    Allergies Patient has no known allergies.  Social History Social History   Tobacco Use  . Smoking status: Never Smoker  . Smokeless tobacco: Never Used  Substance Use Topics  . Alcohol use: Yes  . Drug use: No   Review of Systems Constitutional: Negative for fever. Cardiovascular: Positive for chest pain Respiratory: Negative for shortness of breath. Gastrointestinal: Negative for abdominal pain, vomiting and diarrhea. Musculoskeletal: Positive for back pain Skin: Negative for rash. Neurological: Negative for headaches, focal weakness or numbness.  All systems negative/normal/unremarkable except as stated in the HPI  ____________________________________________   PHYSICAL EXAM:  VITAL SIGNS: ED Triage Vitals  Enc Vitals Group     BP 12/11/18 1451 (!) 135/97     Pulse Rate 12/11/18 1451 (!) 126     Resp 12/11/18 1451 20     Temp 12/11/18 1451 98.6 F (37 C)     Temp Source 12/11/18 1451 Oral     SpO2  12/11/18 1451 98 %     Weight 12/11/18 1452 193 lb (87.5 kg)     Height 12/11/18 1452 5\' 5"  (1.651 m)     Head Circumference --      Peak Flow --      Pain Score 12/11/18 1452 8     Pain Loc --      Pain Edu? --      Excl. in GC? --    Constitutional: Alert and oriented. Well appearing and in no distress. Eyes: Conjunctivae are normal. Normal extraocular movements. Cardiovascular: Normal rate, regular rhythm. No murmurs, rubs, or gallops. Respiratory: Normal respiratory effort without tachypnea nor retractions. Breath sounds are clear and equal bilaterally. No wheezes/rales/rhonchi. Gastrointestinal: Soft and nontender. Normal bowel sounds Musculoskeletal: Nontender with normal range of motion in extremities.  Anterior chest wall is exquisitely tender to touch Neurologic:  Normal speech and language. No gross focal neurologic deficits are appreciated.  Skin:  Skin is warm, dry and intact. No rash noted. Psychiatric: Mood and affect are normal. Speech and behavior are normal.  ____________________________________________  EKG: Interpreted by me.  Sinus tachycardia with a rate of 110 bpm, normal PR interval, normal QRS, normal QT  ____________________________________________  ED COURSE:  As part of my medical decision making, I reviewed the following data within the electronic MEDICAL RECORD NUMBER History obtained from family if available, nursing notes, old chart and ekg, as well as notes from prior ED visits. Patient presented for chest pain, we will assess with labs  and imaging as indicated at this time.   Procedures  Maria Little was evaluated in Emergency Department on 12/11/2018 for the symptoms described in the history of present illness. Maria Little was evaluated in the context of the global COVID-19 pandemic, which necessitated consideration that the patient might be at risk for infection with the SARS-CoV-2 virus that causes COVID-19. Institutional protocols and algorithms that  pertain to the evaluation of patients at risk for COVID-19 are in a state of rapid change based on information released by regulatory bodies including the CDC and federal and state organizations. These policies and algorithms were followed during the patient's care in the ED.  ____________________________________________   LABS (pertinent positives/negatives)  Labs Reviewed  BASIC METABOLIC PANEL - Abnormal; Notable for the following components:      Result Value   Potassium 2.9 (*)    Glucose, Bld 182 (*)    All other components within normal limits  CBC - Abnormal; Notable for the following components:   Platelets 409 (*)    All other components within normal limits  TROPONIN I  FIBRIN DERIVATIVES D-DIMER ([facility] ONLY)    RADIOLOGY  Chest x-ray is unremarkable  ____________________________________________   DIFFERENTIAL DIAGNOSIS   Musculoskeletal pain, GERD, MI, unstable angina, spasm  FINAL ASSESSMENT AND PLAN  Chest pain   Plan: The patient had presented for chest pain appear to be musculoskeletal in origin. Patient's labs were reassuring including d-dimer. Patient's imaging did not reveal any acute process.  Maria Little is cleared for outpatient follow-up.   Ulice DashJohnathan E Liam Bossman, MD    Note: This note was generated in part or whole with voice recognition software. Voice recognition is usually quite accurate but there are transcription errors that can and very often do occur. I apologize for any typographical errors that were not detected and corrected.     Emily FilbertWilliams, Lyrick Lagrand E, MD 12/11/18 [id number]

## 2018-12-11 NOTE — ED Notes (Signed)
Patient transported to X-ray 

## 2018-12-19 ENCOUNTER — Ambulatory Visit: Payer: 59

## 2018-12-19 ENCOUNTER — Other Ambulatory Visit: Payer: Self-pay

## 2018-12-19 DIAGNOSIS — Z3042 Encounter for surveillance of injectable contraceptive: Secondary | ICD-10-CM

## 2018-12-19 LAB — PREGNANCY, URINE: Preg Test, Ur: NEGATIVE

## 2019-01-09 ENCOUNTER — Encounter: Payer: Self-pay | Admitting: Family Medicine

## 2019-01-15 ENCOUNTER — Ambulatory Visit (INDEPENDENT_AMBULATORY_CARE_PROVIDER_SITE_OTHER): Payer: 59 | Admitting: Family Medicine

## 2019-01-15 ENCOUNTER — Encounter: Payer: Self-pay | Admitting: Family Medicine

## 2019-01-15 ENCOUNTER — Other Ambulatory Visit: Payer: Self-pay

## 2019-01-15 DIAGNOSIS — K219 Gastro-esophageal reflux disease without esophagitis: Secondary | ICD-10-CM | POA: Diagnosis not present

## 2019-01-15 DIAGNOSIS — F331 Major depressive disorder, recurrent, moderate: Secondary | ICD-10-CM | POA: Diagnosis not present

## 2019-01-15 MED ORDER — OMEPRAZOLE 20 MG PO CPDR: 20.0000 mg | DELAYED_RELEASE_CAPSULE | Freq: Every day | ORAL | 1 refills | Status: DC

## 2019-01-15 MED ORDER — SERTRALINE HCL 50 MG PO TABS: 50.0000 mg | ORAL_TABLET | Freq: Every day | ORAL | 1 refills | Status: DC

## 2019-01-15 NOTE — Assessment & Plan Note (Signed)
Not great, but stable on current regimen. Does not want to increase. Will continue on current regimen. Continue to monitor. Call with any concerns.

## 2019-01-15 NOTE — Assessment & Plan Note (Signed)
Under good control on current regimen. Continue current regimen. Continue to monitor. Call with any concerns. Refills given.   

## 2019-01-15 NOTE — Progress Notes (Signed)
Temp 97.7 F (36.5 C) (Temporal)   Ht 5\' 5"  (1.651 m)   Wt 188 lb (85.3 kg)   BMI 31.28 kg/m    Subjective:    Patient ID: Maria Little, female    DOB: 28-Jun-1976, 43 y.o.   MRN: [id number]  HPI: Maria Little is a 42 y.o. female  Chief Complaint  Patient presents with  . Depression    sertraline refill  . Gastroesophageal Reflux    omeprazole refill   DEPRESSION Mood status: better Satisfied with current treatment?: no Symptom severity: moderate  Duration of current treatment : a few months Side effects: no Medication compliance: excellent compliance Psychotherapy/counseling: no  Previous psychiatric medications: sertraline Depressed mood: yes Anxious mood: yes Anhedonia: no Significant weight loss or gain: no Insomnia: no  Fatigue: no Feelings of worthlessness or guilt: no Impaired concentration/indecisiveness: no Suicidal ideations: no Hopelessness: no Crying spells: no Depression screen [facility] 2/9 01/15/2019 09/15/2018 05/18/2018 04/21/2018 11/07/2017  Decreased Interest 0 2 0 3 0  Down, Depressed, Hopeless 0 0 0 0 0  PHQ - 2 Score 0 2 0 3 0  Altered sleeping 3 2 0 2 2  Tired, decreased energy 0 1 1 2 1   Change in appetite 2 1 2 3 2   Feeling bad or failure about yourself  0 0 0 1 0  Trouble concentrating 0 2 0 1 0  Moving slowly or fidgety/restless 0 0 0 0 0  Suicidal thoughts 0 0 0 0 0  PHQ-9 Score 5 8 3 12 5   Difficult doing work/chores Not difficult at all Very difficult - Very difficult Somewhat difficult  Some recent data might be hidden   GAD 7 : Generalized Anxiety Score 09/15/2018 11/07/2017 05/09/2017 03/14/2017  Nervous, Anxious, on Edge 0 0 0 0  Control/stop worrying 3 1 1 2   Worry too much - different things 3 2 1 3   Trouble relaxing 3 1 1 1   Restless 2 0 0 0  Easily annoyed or irritable 3 2 0 3  Afraid - awful might happen 3 1 0 1  Total GAD 7 Score 17 7 3 10   Anxiety Difficulty Very difficult Very difficult Somewhat difficult  Somewhat difficult   Relevant past medical, surgical, family and social history reviewed and updated as indicated. Interim medical history since our last visit reviewed. Allergies and medications reviewed and updated.  Review of Systems  Constitutional: Negative.   Respiratory: Negative.   Cardiovascular: Negative.   Gastrointestinal: Negative.   Musculoskeletal: Negative.   Skin: Negative.   Psychiatric/Behavioral: Positive for dysphoric mood. Negative for agitation, behavioral problems, confusion, decreased concentration, hallucinations, self-injury, sleep disturbance and suicidal ideas. The patient is nervous/anxious. The patient is not hyperactive.     Per HPI unless specifically indicated above     Objective:    Temp 97.7 F (36.5 C) (Temporal)   Ht 5\' 5"  (1.651 m)   Wt 188 lb (85.3 kg)   BMI 31.28 kg/m   Wt Readings from Last 3 Encounters:  01/15/19 188 lb (85.3 kg)  12/11/18 193 lb (87.5 kg)  09/15/18 199 lb (90.3 kg)    Physical Exam Vitals signs and nursing note reviewed.  Constitutional:      General: She is not in acute distress.    Appearance: Normal appearance. She is not ill-appearing, toxic-appearing or diaphoretic.  HENT:     Head: Normocephalic and atraumatic.     Right Ear: External ear normal.     Left Ear: External  ear normal.     Nose: Nose normal.     Mouth/Throat:     Mouth: Mucous membranes are moist.     Pharynx: Oropharynx is clear.  Eyes:     General: No scleral icterus.       Right eye: No discharge.        Left eye: No discharge.     Conjunctiva/sclera: Conjunctivae normal.     Pupils: Pupils are equal, round, and reactive to light.  Neck:     Musculoskeletal: Normal range of motion.  Pulmonary:     Effort: Pulmonary effort is normal. No respiratory distress.     Comments: Speaking in full sentences Musculoskeletal: Normal range of motion.  Skin:    Coloration: Skin is not jaundiced or pale.     Findings: No bruising, erythema,  lesion or rash.  Neurological:     Mental Status: She is alert and oriented to person, place, and time. Mental status is at baseline.  Psychiatric:        Mood and Affect: Mood normal.        Behavior: Behavior normal.        Thought Content: Thought content normal.        Judgment: Judgment normal.     Results for orders placed or performed in visit on 12/19/18  Pregnancy, urine  Result Value Ref Range   Preg Test, Ur Negative Negative      Assessment & Little:   Problem List Items Addressed This Visit      Digestive   Gastroesophageal reflux disease    Under good control on current regimen. Continue current regimen. Continue to monitor. Call with any concerns. Refills given.        Relevant Medications   omeprazole (PRILOSEC) 20 MG capsule     Other   Depression, major, recurrent, moderate (HCC)    Not great, but stable on current regimen. Does not want to increase. Will continue on current regimen. Continue to monitor. Call with any concerns.       Relevant Medications   sertraline (ZOLOFT) 50 MG tablet       Follow up Little: Return in about 6 months (around 07/17/2019) for Physical.    . This visit was completed via FaceTime due to the restrictions of the COVID-19 pandemic. All issues as above were discussed and addressed. Physical exam was done as above through visual confirmation on FaceTime. If it was felt that the patient should be evaluated in the office, they were directed there. The patient verbally consented to this visit. . Location of the patient: work . Location of the provider: work . Those involved with this call:  . Provider: Olevia PerchesMegan Tamotsu Wiederholt, DO . CMA: Elton SinAnita Quito, CMA . Front Desk/Registration: Adela Portshristan Williamson  . Time spent on call: 15 minutes with patient face to face via video conference. More than 50% of this time was spent in counseling and coordination of care. 23 minutes total spent in review of patient's record and preparation of their  chart.

## 2019-02-13 ENCOUNTER — Encounter: Payer: Self-pay | Admitting: Family Medicine

## 2019-03-08 ENCOUNTER — Ambulatory Visit (INDEPENDENT_AMBULATORY_CARE_PROVIDER_SITE_OTHER): Payer: 59

## 2019-03-08 ENCOUNTER — Other Ambulatory Visit: Payer: Self-pay

## 2019-03-08 DIAGNOSIS — Z3042 Encounter for surveillance of injectable contraceptive: Secondary | ICD-10-CM

## 2019-05-18 ENCOUNTER — Other Ambulatory Visit: Payer: Self-pay | Admitting: Family Medicine

## 2019-05-29 ENCOUNTER — Encounter: Payer: Self-pay | Admitting: Family Medicine

## 2019-06-04 ENCOUNTER — Ambulatory Visit (INDEPENDENT_AMBULATORY_CARE_PROVIDER_SITE_OTHER): Payer: 59

## 2019-06-04 ENCOUNTER — Other Ambulatory Visit: Payer: Self-pay

## 2019-06-04 DIAGNOSIS — Z3042 Encounter for surveillance of injectable contraceptive: Secondary | ICD-10-CM

## 2019-06-04 DIAGNOSIS — Z23 Encounter for immunization: Secondary | ICD-10-CM

## 2019-06-04 MED ORDER — MEDROXYPROGESTERONE ACETATE 150 MG/ML IM SUSP
150.0000 mg | Freq: Once | INTRAMUSCULAR | Status: AC
  Administered 2019-06-04: 150 mg via INTRAMUSCULAR

## 2019-07-25 ENCOUNTER — Encounter: Payer: Self-pay | Admitting: Family Medicine

## 2019-08-22 ENCOUNTER — Encounter: Payer: Self-pay | Admitting: Family Medicine

## 2019-08-22 ENCOUNTER — Emergency Department
Admission: EM | Admit: 2019-08-22 | Discharge: 2019-08-22 | Disposition: A | Discharge status: Home / Self Care | Payer: 59 | Attending: Emergency Medicine | Admitting: Emergency Medicine

## 2019-08-22 ENCOUNTER — Other Ambulatory Visit: Payer: Self-pay

## 2019-08-22 ENCOUNTER — Emergency Department: Payer: 59

## 2019-08-22 DIAGNOSIS — B9789 Other viral agents as the cause of diseases classified elsewhere: Secondary | ICD-10-CM

## 2019-08-22 DIAGNOSIS — Z20822 Contact with and (suspected) exposure to covid-19: Secondary | ICD-10-CM | POA: Insufficient documentation

## 2019-08-22 DIAGNOSIS — B349 Viral infection, unspecified: Secondary | ICD-10-CM | POA: Insufficient documentation

## 2019-08-22 DIAGNOSIS — Z79899 Other long term (current) drug therapy: Secondary | ICD-10-CM | POA: Insufficient documentation

## 2019-08-22 DIAGNOSIS — J029 Acute pharyngitis, unspecified: Secondary | ICD-10-CM | POA: Diagnosis present

## 2019-08-22 DIAGNOSIS — J069 Acute upper respiratory infection, unspecified: Secondary | ICD-10-CM

## 2019-08-22 LAB — GROUP A STREP BY PCR: Group A Strep by PCR: NOT DETECTED

## 2019-08-22 LAB — SARS CORONAVIRUS 2 (TAT 6-24 HRS): SARS Coronavirus 2: NEGATIVE

## 2019-08-22 MED ORDER — PSEUDOEPH-BROMPHEN-DM 30-2-10 MG/5ML PO SYRP: 5.0000 mL | ORAL_SOLUTION | Freq: Four times a day (QID) | ORAL | 0 refills | Status: DC | PRN

## 2019-08-22 MED ORDER — LIDOCAINE VISCOUS HCL 2 % MT SOLN: 5.0000 mL | Freq: Four times a day (QID) | OROMUCOSAL | 0 refills | Status: DC | PRN

## 2019-08-22 NOTE — ED Provider Notes (Signed)
[facility] Emergency Department Provider Note   ____________________________________________   First MD Initiated Contact with Patient 08/22/19 [id number]     (approximate)  I have reviewed the triage vital signs and the nursing notes.   HISTORY  Chief Complaint URI    HPI Maria Little is a 44 y.o. female patient presents with 3 days of sore throat, productive cough, and fever.  Patient denies recent travel or known exposure to COVID-19.  Patient denies nausea, vomiting, diarrhea.  Patient denies myalgia or arthralgia.  No palliative measure for complaint.         Past Medical History:  Diagnosis Date  . Migraine     Patient Active Problem List   Diagnosis Date Noted  . Hyperlipidemia 10/11/2016  . Depression, major, recurrent, moderate (HCC) 10/05/2016  . Gastroesophageal reflux disease 10/05/2016    Past Surgical History:  Procedure Laterality Date  . BREAST SURGERY     Lumpectomy    Prior to Admission medications   Medication Sig Start Date End Date Taking? Authorizing Provider  acetaminophen (TYLENOL) 325 MG tablet Take 650 mg by mouth every 6 (six) hours as needed.    [provider]  brompheniramine-pseudoephedrine-DM 30-2-10 MG/5ML syrup Take 5 mLs by mouth 4 (four) times daily as needed. 08/22/19   Joni Reining, PA-C  diazepam (VALIUM) 5 MG tablet Take 1 tablet (5 mg total) by mouth every 8 (eight) hours as needed for muscle spasms. 12/11/18   Emily Filbert, MD  ibuprofen (ADVIL) 800 MG tablet Take 1 tablet (800 mg total) by mouth every 8 (eight) hours as needed. 12/11/18   Emily Filbert, MD  ketorolac (TORADOL) 10 MG tablet Take 1 tablet (10 mg total) by mouth every 8 (eight) hours as needed for moderate pain (with food). 05/16/18   Rockne Menghini, MD  lidocaine (XYLOCAINE) 2 % solution Use as directed 5 mLs in the mouth or throat every 6 (six) hours as needed for mouth pain. Oral swish/swallow 08/22/19    Joni Reining, PA-C  naproxen (NAPROSYN) 500 MG tablet Take 1 tablet (500 mg total) by mouth 2 (two) times daily with a meal. 06/27/18   Johnson, Megan P, DO  omeprazole (PRILOSEC) 20 MG capsule Take 1 capsule (20 mg total) by mouth daily. 01/15/19   Olevia Perches P, DO  sertraline (ZOLOFT) 50 MG tablet Take 1 tablet by mouth once daily 05/18/19   Olevia Perches P, DO    Allergies Patient has no known allergies.  Family History  Problem Relation Age of Onset  . Diabetes Mother   . Hyperlipidemia Mother   . Hypertension Mother   . Hypertension Father   . Hypertension Brother     Social History Social History   Tobacco Use  . Smoking status: Never Smoker  . Smokeless tobacco: Never Used  Substance Use Topics  . Alcohol use: Yes  . Drug use: No    Review of Systems  Constitutional: Fever/chills.   Eyes: No visual changes. ENT: Sore throat. Cardiovascular: Denies chest pain. Respiratory: Denies shortness of breath.  Productive cough Gastrointestinal: No abdominal pain.  No nausea, no vomiting.  No diarrhea.  No constipation. Genitourinary: Negative for dysuria. Musculoskeletal: Negative for back pain. Skin: Negative for rash. Neurological: Negative for headaches, focal weakness or numbness. Psychiatric:  Depression. Endocrine:  Hyperlipidemia.  ____________________________________________   PHYSICAL EXAM:  VITAL SIGNS: ED Triage Vitals  Enc Vitals Group     BP 08/22/19 0736 125/84  Pulse Rate 08/22/19 0736 (!) 113     Resp 08/22/19 0736 17     Temp 08/22/19 0736 99.1 F (37.3 C)     Temp Source 08/22/19 0736 Oral     SpO2 08/22/19 0736 99 %     Weight 08/22/19 0737 201 lb (91.2 kg)     Height 08/22/19 0737 5\' 5"  (1.651 m)     Head Circumference --      Peak Flow --      Pain Score 08/22/19 0736 0     Pain Loc --      Pain Edu? --      Excl. in [place]? --    Constitutional: Alert and oriented. Well appearing and in no acute distress. Eyes: Conjunctivae  are normal. PERRL. EOMI. Head: Atraumatic. Nose: Edematous nasal turbinates clear rhinorrhea. Mouth/Throat: Mucous membranes are moist.  Oropharynx non-erythematous.  Postnasal drainage. Neck: No stridor.   Hematological/Lymphatic/Immunilogical: No cervical lymphadenopathy. Cardiovascular: Tachycardic, regular rhythm. Grossly normal heart sounds.  Good peripheral circulation. Respiratory: Normal respiratory effort.  No retractions. Lungs CTAB. Skin:  Skin is warm, dry and intact. No rash noted. Psychiatric: Mood and affect are normal. Speech and behavior are normal.  ____________________________________________   LABS (all labs ordered are listed, but only abnormal results are displayed)  Labs Reviewed  GROUP A STREP BY PCR  SARS CORONAVIRUS 2 (TAT 6-24 HRS)   ____________________________________________  EKG   ____________________________________________  RADIOLOGY  ED MD interpretation:    Official radiology report(s): DG Chest Portable 1 View  Result Date: 08/22/2019 CLINICAL DATA:  Productive cough and fever for 3 days EXAM: PORTABLE CHEST 1 VIEW COMPARISON:  Dec 11, 2018 FINDINGS: Cardiomediastinal contours are normal. Hilar structures are unremarkable. Lungs are clear. No pleural effusion. Visualized skeletal structures are unremarkable. IMPRESSION: Negative chest. Electronically Signed   By: Zetta Bills M.D.   On: 08/22/2019 09:11    ____________________________________________   PROCEDURES  Procedure(s) performed (including Critical Care):  Procedures   ____________________________________________   INITIAL IMPRESSION / ASSESSMENT AND PLAN / ED COURSE  As part of my medical decision making, I reviewed the following data within the [facility]     Patient presents with 3 days of cough, sore throat, and fatigue.  Discussed negative strep results and chest x-ray with patient.  Physical exam consistent with viral illness.  Patient given  discharge care instructions and advised take medication as directed.  Advised patient self quarantine pending results of COVID-19 test.  If test is positive she was quarantine for additional 10 days.    Maria Little was evaluated in Emergency Department on 08/22/2019 for the symptoms described in the history of present illness. She was evaluated in the context of the global COVID-19 pandemic, which necessitated consideration that the patient might be at risk for infection with the SARS-CoV-2 virus that causes COVID-19. Institutional protocols and algorithms that pertain to the evaluation of patients at risk for COVID-19 are in a state of rapid change based on information released by regulatory bodies including the CDC and federal and state organizations. These policies and algorithms were followed during the patient's care in the ED.       ____________________________________________   FINAL CLINICAL IMPRESSION(S) / ED DIAGNOSES  Final diagnoses:  Viral URI with cough  Sore throat (viral)     ED Discharge Orders         Ordered    brompheniramine-pseudoephedrine-DM 30-2-10 MG/5ML syrup  4 times daily PRN  08/22/19 0944    lidocaine (XYLOCAINE) 2 % solution  Every 6 hours PRN     08/22/19 0944           Note:  This document was prepared using Dragon voice recognition software and may include unintentional dictation errors.    Joni Reining, PA-C 08/22/19 6962    Chesley Noon, MD 08/23/19 1153

## 2019-08-22 NOTE — Discharge Instructions (Addendum)
Follow discharge care instruction take medication as directed.  Advised self quarantine pending results of COVID-19 test.  You may monitor your test results in the MyChart app.

## 2019-08-22 NOTE — ED Triage Notes (Signed)
Pt c/o cough with sore throat for the past 3 days. Pt is in nAD.

## 2019-08-30 ENCOUNTER — Telehealth: Payer: Self-pay

## 2019-08-30 NOTE — Telephone Encounter (Signed)
That's fine

## 2019-08-30 NOTE — Telephone Encounter (Signed)
Copied from CRM [id number]. Topic: Clinical - COVID Pre-Screen >> Aug 30, 2019 10:50 AM Leafy Ro wrote: 1. To the best of your knowledge, have you been in close contact with anyone with a confirmed diagnosis of COVID 19?    2. Have you had any one or more of the following: fever, chills, cough, shortness of breath or any flu-like symptoms?  Yes Sob on 08-22-2019 3. Have you been diagnosed with or have a previous diagnosis of COVID 19?  no  4. I am going to go over a few other symptoms with you. Please let me know if you are experiencing any of the following  Ear, nose or throat discomfort  A sore throat  Headache  Muscle pain  Diarrhea  Loss of taste or smell  If no - Continue with scheduling process; If yes - Document in scheduling notes   Thank you for answering these questions. Please know we will ask you these questions or similar questions when you arrive for your appointment and again it's how we are keeping everyone safe. Also, to keep you safe, please use the provided hand sanitizer when you enter the building. [name], we are asking everyone in the building to wear a mask because they help [place] prevent the spread of germs.   Do you have a mask of your own, if not, we are happy to provide one for you. The last thing I want to go over with you is the no visitor guidelines. This means no one can attend the appointment with you unless you need physical assistance. I understand this may be different from your past appointments and I know this may be difficult but please know if someone is driving you we are happy to call them for you once your appointment is over. >> Aug 30, 2019 11:10 AM Adela Ports M wrote: Pt experienced SOB 08/22/19 but was Covid neg. Please advise.

## 2019-08-30 NOTE — Telephone Encounter (Signed)
Routing to provider.  Can patient come in and get her Depo tomorrow?

## 2019-08-31 ENCOUNTER — Ambulatory Visit (INDEPENDENT_AMBULATORY_CARE_PROVIDER_SITE_OTHER): Payer: 59

## 2019-08-31 ENCOUNTER — Other Ambulatory Visit: Payer: Self-pay

## 2019-08-31 DIAGNOSIS — Z3042 Encounter for surveillance of injectable contraceptive: Secondary | ICD-10-CM

## 2019-08-31 MED ORDER — MEDROXYPROGESTERONE ACETATE 150 MG/ML IM SUSP
150.0000 mg | Freq: Once | INTRAMUSCULAR | Status: AC
  Administered 2019-08-31: 150 mg via INTRAMUSCULAR

## 2019-09-16 ENCOUNTER — Ambulatory Visit: Payer: 59 | Attending: Family Medicine

## 2019-09-16 DIAGNOSIS — Z23 Encounter for immunization: Secondary | ICD-10-CM | POA: Insufficient documentation

## 2019-09-16 NOTE — Progress Notes (Signed)
   Covid-19 Vaccination Clinic  Name:  SOPHIAH ROLIN    MRN: [id number] DOB: August 21, 1975  09/16/2019  Ms. Cabral was observed post Covid-19 immunization for 15 minutes without incidence. She was provided with Vaccine Information Sheet and instruction to access the V-Safe system.   Ms. Mchaney was instructed to call 911 with any severe reactions post vaccine: Marland Kitchen Difficulty breathing  . Swelling of your face and throat  . A fast heartbeat  . A bad rash all over your body  . Dizziness and weakness    Immunizations Administered    Name Date Dose VIS Date Route   Pfizer COVID-19 Vaccine 09/16/2019  8:22 AM 0.3 mL 06/29/2019 Intramuscular   Manufacturer: [employer], [employer]   Lot: [id number]   NDC: [address]-6

## 2019-09-25 ENCOUNTER — Encounter: Payer: Self-pay | Admitting: Family Medicine

## 2019-10-03 ENCOUNTER — Telehealth: Payer: 59 | Admitting: Family Medicine

## 2019-10-03 ENCOUNTER — Encounter: Payer: Self-pay | Admitting: Family Medicine

## 2019-10-03 ENCOUNTER — Telehealth (INDEPENDENT_AMBULATORY_CARE_PROVIDER_SITE_OTHER): Payer: 59 | Admitting: Family Medicine

## 2019-10-03 ENCOUNTER — Telehealth: Payer: Self-pay | Admitting: Family Medicine

## 2019-10-03 VITALS — Wt 189.0 lb

## 2019-10-03 DIAGNOSIS — F331 Major depressive disorder, recurrent, moderate: Secondary | ICD-10-CM | POA: Diagnosis not present

## 2019-10-03 DIAGNOSIS — F5104 Psychophysiologic insomnia: Secondary | ICD-10-CM | POA: Diagnosis not present

## 2019-10-03 MED ORDER — TRAZODONE HCL 50 MG PO TABS: 25.0000 mg | ORAL_TABLET | Freq: Every evening | ORAL | 3 refills | Status: DC | PRN

## 2019-10-03 MED ORDER — SERTRALINE HCL 100 MG PO TABS: 100.0000 mg | ORAL_TABLET | Freq: Every day | ORAL | 3 refills | Status: DC

## 2019-10-03 NOTE — Assessment & Plan Note (Signed)
Not doing well. Will increase her sertraline to 100mg  and recheck 1 month. Will get CCM social work involved to see if they can help. Call with any concerns.

## 2019-10-03 NOTE — Progress Notes (Signed)
Wt 189 lb (85.7 kg)   BMI 31.45 kg/m    Subjective:    Patient ID: Maria Little, female    DOB: May 28, 1976, 44 y.o.   MRN: [id number]  HPI: Maria Little is a 44 y.o. female  Chief Complaint  Patient presents with  . Anxiety   ANXIETY/DEPRESSION- she states that she has a personal issue that is effecting everything. She has gotten in trouble legally 2x since January. She notes that she has not been sleeping. She got 2 DWIs, thinks that her anxiety has been worse and that's why she's been drinking more. Now she's very anxious and concerned because she knows she may have to go to jail and she's scared about that.  Duration: Chronic Status:exacerbated Anxious mood: yes  Excessive worrying: yes Irritability: yes  Sweating: no Nausea: no Palpitations:no Hyperventilation: no Panic attacks: no Agoraphobia: no  Obscessions/compulsions: no Depressed mood: yes Depression screen [facility] 2/9 10/03/2019 01/15/2019 09/15/2018 05/18/2018 04/21/2018  Decreased Interest 0 0 2 0 3  Down, Depressed, Hopeless 1 0 0 0 0  PHQ - 2 Score 1 0 2 0 3  Altered sleeping 3 3 2  0 2  Tired, decreased energy 0 0 1 1 2   Change in appetite 0 2 1 2 3   Feeling bad or failure about yourself  0 0 0 0 1  Trouble concentrating 0 0 2 0 1  Moving slowly or fidgety/restless 0 0 0 0 0  Suicidal thoughts 0 0 0 0 0  PHQ-9 Score 4 5 8 3 12   Difficult doing work/chores Not difficult at all Not difficult at all Very difficult - Very difficult  Some recent data might be hidden   GAD 7 : Generalized Anxiety Score 10/03/2019 09/15/2018 11/07/2017 05/09/2017  Nervous, Anxious, on Edge 0 0 0 0  Control/stop worrying 3 3 1 1   Worry too much - different things 1 3 2 1   Trouble relaxing 0 3 1 1   Restless 0 2 0 0  Easily annoyed or irritable 1 3 2  0  Afraid - awful might happen 2 3 1  0  Total GAD 7 Score 7 17 7 3   Anxiety Difficulty Not difficult at all Very difficult Very difficult Somewhat difficult   Anhedonia: no  Weight changes: no Insomnia: yes hard to fall asleep  Hypersomnia: yes Fatigue/loss of energy: yes Feelings of worthlessness: yes Feelings of guilt: yes Impaired concentration/indecisiveness: no Suicidal ideations: no  Crying spells: no Recent Stressors/Life Changes: yes   Relationship problems: no   Family stress: no     Financial stress: yes    Job stress: yes    Recent death/loss: no  Relevant past medical, surgical, family and social history reviewed and updated as indicated. Interim medical history since our last visit reviewed. Allergies and medications reviewed and updated.  Review of Systems  Constitutional: Negative.   Respiratory: Negative.   Cardiovascular: Negative.   Gastrointestinal: Negative.   Skin: Negative.   Psychiatric/Behavioral: Positive for agitation, dysphoric mood and sleep disturbance. Negative for behavioral problems, confusion, decreased concentration, hallucinations, self-injury and suicidal ideas. The patient is nervous/anxious. The patient is not hyperactive.     Per HPI unless specifically indicated above     Objective:    Wt 189 lb (85.7 kg)   BMI 31.45 kg/m   Wt Readings from Last 3 Encounters:  10/03/19 189 lb (85.7 kg)  08/22/19 201 lb (91.2 kg)  01/15/19 188 lb (85.3 kg)    Physical Exam Vitals and  nursing note reviewed.  Pulmonary:     Effort: Pulmonary effort is normal. No respiratory distress.     Comments: Speaking in full sentences Neurological:     Mental Status: She is alert.  Psychiatric:        Mood and Affect: Mood normal.        Behavior: Behavior normal.        Thought Content: Thought content normal.        Judgment: Judgment normal.     Results for orders placed or performed during the hospital encounter of 08/22/19  Group A Strep by PCR   Specimen: Nasopharyngeal Swab; Sterile Swab  Result Value Ref Range   Group A Strep by PCR NOT DETECTED NOT DETECTED  SARS CORONAVIRUS 2 (TAT 6-24 HRS) Nasopharyngeal  Nasopharyngeal Swab   Specimen: Nasopharyngeal Swab  Result Value Ref Range   SARS Coronavirus 2 NEGATIVE NEGATIVE      Assessment & Little:   Problem List Items Addressed This Visit      Other   Depression, major, recurrent, moderate (HCC) - Primary    Not doing well. Will increase her sertraline to 100mg  and recheck 1 month. Will get CCM social work involved to see if they can help. Call with any concerns.       Relevant Medications   traZODone (DESYREL) 50 MG tablet   sertraline (ZOLOFT) 100 MG tablet   Other Relevant Orders   Referral to Chronic Care Management Services    Other Visit Diagnoses    Psychophysiological insomnia       Will start trazodone. Call with any concerns. Continue to monitor.    Relevant Orders   Referral to Chronic Care Management Services       Follow up Little: Return in about 4 weeks (around 10/31/2019).    . This visit was completed via telephone due to the restrictions of the COVID-19 pandemic. All issues as above were discussed and addressed but no physical exam was performed. If it was felt that the patient should be evaluated in the office, they were directed there. The patient verbally consented to this visit. Patient was unable to complete an audio/visual visit due to Lack of equipment. Due to the catastrophic nature of the COVID-19 pandemic, this visit was done through audio contact only. . Location of the patient: home . Location of the provider: work . Those involved with this call:  . Provider: 11/02/2019, DO . CMA: Tiffany Reel, CMA . Front Desk/Registration: Olevia Perches  . Time spent on call: 15 minutes on the phone discussing health concerns. 23 minutes total spent in review of patient's record and preparation of their chart.

## 2019-10-03 NOTE — Telephone Encounter (Signed)
Return in about 4 weeks (around 10/31/2019) per Dr.Johnson, lvm sent letter

## 2019-10-08 ENCOUNTER — Ambulatory Visit: Payer: 59

## 2019-10-13 ENCOUNTER — Ambulatory Visit: Payer: 59 | Attending: Internal Medicine

## 2019-10-13 DIAGNOSIS — Z23 Encounter for immunization: Secondary | ICD-10-CM

## 2019-10-13 NOTE — Progress Notes (Signed)
   Covid-19 Vaccination Clinic  Name:  Maria Little    MRN: [id number] DOB: 16-Dec-1975  10/13/2019  [name] was observed post Covid-19 immunization for 15 minutes without incident. She was provided with Vaccine Information Sheet and instruction to access the V-Safe system.   [name] was instructed to call 911 with any severe reactions post vaccine: Marland Kitchen Difficulty breathing  . Swelling of face and throat  . A fast heartbeat  . A bad rash all over body  . Dizziness and weakness   Immunizations Administered    Name Date Dose VIS Date Route   Pfizer COVID-19 Vaccine 10/13/2019 11:14 AM 0.3 mL 06/29/2019 Intramuscular   Manufacturer: [employer], [employer]   Lot: [id number]   NDC: [address]-3

## 2019-10-15 NOTE — Telephone Encounter (Signed)
Called x2 lvm to make this apt.

## 2019-10-17 ENCOUNTER — Encounter: Payer: Self-pay | Admitting: Family Medicine

## 2019-10-18 ENCOUNTER — Other Ambulatory Visit: Payer: Self-pay | Admitting: Family Medicine

## 2019-10-18 ENCOUNTER — Encounter: Payer: Self-pay | Admitting: Family Medicine

## 2019-10-22 NOTE — Telephone Encounter (Signed)
Pt has at on 10/31/2019 virtual apt per her request, Pt understood and verbalized understanding.

## 2019-10-26 ENCOUNTER — Telehealth: Payer: 59 | Admitting: Family Medicine

## 2019-10-31 ENCOUNTER — Telehealth (INDEPENDENT_AMBULATORY_CARE_PROVIDER_SITE_OTHER): Payer: 59 | Admitting: Family Medicine

## 2019-10-31 ENCOUNTER — Encounter: Payer: Self-pay | Admitting: Family Medicine

## 2019-10-31 VITALS — Temp 97.1°F | Wt 191.0 lb

## 2019-10-31 DIAGNOSIS — F331 Major depressive disorder, recurrent, moderate: Secondary | ICD-10-CM | POA: Diagnosis not present

## 2019-10-31 DIAGNOSIS — F5104 Psychophysiologic insomnia: Secondary | ICD-10-CM | POA: Diagnosis not present

## 2019-10-31 MED ORDER — TRAZODONE HCL 50 MG PO TABS: 50.0000 mg | ORAL_TABLET | Freq: Every evening | ORAL | 3 refills | Status: DC | PRN

## 2019-10-31 MED ORDER — SERTRALINE HCL 100 MG PO TABS: 100.0000 mg | ORAL_TABLET | Freq: Every day | ORAL | 6 refills | Status: DC

## 2019-10-31 NOTE — Assessment & Plan Note (Signed)
Stable. Still not doing great. Would like to stay on 100mg  daily. Call with any concerns. Continue to monitor.

## 2019-10-31 NOTE — Progress Notes (Signed)
Temp (!) 97.1 F (36.2 C)   Wt 191 lb (86.6 kg)   BMI 31.78 kg/m    Subjective:    Patient ID: Maria Little, female    DOB: 27-Jan-1976, 44 y.o.   MRN: [id number]  HPI: Maria Little is a 44 y.o. female  Chief Complaint  Patient presents with  . Depression   DEPRESSION Mood status: stable Satisfied with current treatment?: yes Symptom severity: moderate  Duration of current treatment : chronic Side effects: no Medication compliance: good compliance Psychotherapy/counseling: no  Previous psychiatric medications: sertraline Depressed mood: yes Anxious mood: yes Anhedonia: no Significant weight loss or gain: no Insomnia: yes hard to stay asleep Fatigue: yes Feelings of worthlessness or guilt: yes Impaired concentration/indecisiveness: no Suicidal ideations: no Hopelessness: no Crying spells: yes Depression screen [facility] 2/9 10/31/2019 10/03/2019 01/15/2019 09/15/2018 05/18/2018  Decreased Interest 0 0 0 2 0  Down, Depressed, Hopeless 2 1 0 0 0  PHQ - 2 Score 2 1 0 2 0  Altered sleeping 3 3 3 2  0  Tired, decreased energy 0 0 0 1 1  Change in appetite 1 0 2 1 2   Feeling bad or failure about yourself  2 0 0 0 0  Trouble concentrating 0 0 0 2 0  Moving slowly or fidgety/restless 0 0 0 0 0  Suicidal thoughts 0 0 0 0 0  PHQ-9 Score 8 4 5 8 3   Difficult doing work/chores Somewhat difficult Not difficult at all Not difficult at all Very difficult -  Some recent data might be hidden   Relevant past medical, surgical, family and social history reviewed and updated as indicated. Interim medical history since our last visit reviewed. Allergies and medications reviewed and updated.  Review of Systems  Constitutional: Negative.   Respiratory: Negative.   Cardiovascular: Negative.   Gastrointestinal: Negative.   Musculoskeletal: Negative.   Neurological: Negative.   Psychiatric/Behavioral: Positive for agitation, behavioral problems, dysphoric mood and sleep  disturbance. Negative for confusion, decreased concentration, hallucinations, self-injury and suicidal ideas. The patient is nervous/anxious. The patient is not hyperactive.     Per HPI unless specifically indicated above     Objective:    Temp (!) 97.1 F (36.2 C)   Wt 191 lb (86.6 kg)   BMI 31.78 kg/m   Wt Readings from Last 3 Encounters:  10/31/19 191 lb (86.6 kg)  10/03/19 189 lb (85.7 kg)  08/22/19 201 lb (91.2 kg)    Physical Exam Vitals and nursing note reviewed.  Constitutional:      General: She is not in acute distress.    Appearance: Normal appearance. She is not ill-appearing, toxic-appearing or diaphoretic.  HENT:     Head: Normocephalic and atraumatic.     Right Ear: External ear normal.     Left Ear: External ear normal.     Nose: Nose normal.     Mouth/Throat:     Mouth: Mucous membranes are moist.     Pharynx: Oropharynx is clear.  Eyes:     General: No scleral icterus.       Right eye: No discharge.        Left eye: No discharge.     Conjunctiva/sclera: Conjunctivae normal.     Pupils: Pupils are equal, round, and reactive to light.  Pulmonary:     Effort: Pulmonary effort is normal. No respiratory distress.     Comments: Speaking in full sentences Musculoskeletal:        General: Normal range  of motion.     Cervical back: Normal range of motion.  Skin:    Coloration: Skin is not jaundiced or pale.     Findings: No bruising, erythema, lesion or rash.  Neurological:     Mental Status: She is alert and oriented to person, place, and time. Mental status is at baseline.  Psychiatric:        Mood and Affect: Mood normal.        Behavior: Behavior normal.        Thought Content: Thought content normal.        Judgment: Judgment normal.     Results for orders placed or performed during the hospital encounter of 08/22/19  Group A Strep by PCR   Specimen: Nasopharyngeal Swab; Sterile Swab  Result Value Ref Range   Group A Strep by PCR NOT DETECTED  NOT DETECTED  SARS CORONAVIRUS 2 (TAT 6-24 HRS) Nasopharyngeal Nasopharyngeal Swab   Specimen: Nasopharyngeal Swab  Result Value Ref Range   SARS Coronavirus 2 NEGATIVE NEGATIVE      Assessment & Plan:   Problem List Items Addressed This Visit      Other   Depression, major, recurrent, moderate ([place]) - Primary    Stable. Still not doing great. Would like to stay on 100mg  daily. Call with any concerns. Continue to monitor.       Relevant Medications   traZODone (DESYREL) 50 MG tablet   sertraline (ZOLOFT) 100 MG tablet    Other Visit Diagnoses    Psychophysiological insomnia       Will increase her trazodone to 100mg  PRN if not getting better, will consider other medications. Recheck at follow up.       Follow up plan: Return As scheduled.    . This visit was completed via MyChart due to the restrictions of the COVID-19 pandemic. All issues as above were discussed and addressed. Physical exam was done as above through visual confirmation on MyChart. If it was felt that the patient should be evaluated in the office, they were directed there. The patient verbally consented to this visit. . Location of the patient: home . Location of the provider: home . Those involved with this call:  . Provider: Park Liter, DO . CMA: Lauretta Grill, RMA . Front Desk/Registration: Don Perking  . Time spent on call: 15 minutes with patient face to face via video conference. More than 50% of this time was spent in counseling and coordination of care. 23 minutes total spent in review of patient's record and preparation of their chart.

## 2019-11-16 ENCOUNTER — Ambulatory Visit: Payer: Self-pay

## 2019-11-16 NOTE — Chronic Care Management (AMB) (Signed)
  Care Management   Follow Up Note   11/16/2019 Name: Maria Little MRN: [id number] DOB: 07/20/1975  Referred by: Dorcas Carrow, DO Reason for referral : Care Coordination   Maria Little is a 44 y.o. year old female who is a primary care patient of Dorcas Carrow, DO. The care management team was consulted for assistance with care management and care coordination needs.    Review of patient status, including review of consultants reports, relevant laboratory and other test results, and collaboration with appropriate care team members and the patient's provider was performed as part of comprehensive patient evaluation and provision of chronic care management services.    LCSW completed CCM outreach attempt today but was unable to reach patient successfully. A HIPPA compliant voice message was left encouraging patient to return call once available. LCSW rescheduled CCM SW appointment as well.  A HIPPA compliant phone message was left for the patient providing contact information and requesting a return call.   Dickie La, BSW, MSW, LCSW [facility] Care Management [place]  [facility] [email or web address] Phone: [phone]

## 2019-11-19 ENCOUNTER — Encounter: Payer: Self-pay | Admitting: Family Medicine

## 2019-11-19 ENCOUNTER — Ambulatory Visit (INDEPENDENT_AMBULATORY_CARE_PROVIDER_SITE_OTHER): Payer: 59 | Admitting: Family Medicine

## 2019-11-19 ENCOUNTER — Other Ambulatory Visit: Payer: Self-pay

## 2019-11-19 VITALS — BP 135/86 | HR 105 | Temp 98.6°F | Ht 65.0 in | Wt 204.4 lb

## 2019-11-19 DIAGNOSIS — E782 Mixed hyperlipidemia: Secondary | ICD-10-CM | POA: Diagnosis not present

## 2019-11-19 DIAGNOSIS — Z0000 Encounter for general adult medical examination without abnormal findings: Secondary | ICD-10-CM | POA: Diagnosis not present

## 2019-11-19 DIAGNOSIS — Z3042 Encounter for surveillance of injectable contraceptive: Secondary | ICD-10-CM | POA: Diagnosis not present

## 2019-11-19 DIAGNOSIS — F331 Major depressive disorder, recurrent, moderate: Secondary | ICD-10-CM

## 2019-11-19 DIAGNOSIS — Z113 Encounter for screening for infections with a predominantly sexual mode of transmission: Secondary | ICD-10-CM | POA: Diagnosis not present

## 2019-11-19 DIAGNOSIS — F5104 Psychophysiologic insomnia: Secondary | ICD-10-CM

## 2019-11-19 LAB — MICROSCOPIC EXAMINATION

## 2019-11-19 LAB — URINALYSIS, ROUTINE W REFLEX MICROSCOPIC
Bilirubin, UA: NEGATIVE
Leukocytes,UA: NEGATIVE
Nitrite, UA: NEGATIVE
Specific Gravity, UA: >1.03 — ABNORMAL HIGH (ref 1.005–1.030)
Urobilinogen, Ur: 0.2 mg/dL (ref 0.2–1.0)
pH, UA: 5.5 (ref 5.0–7.5)

## 2019-11-19 MED ORDER — ZOLPIDEM TARTRATE 5 MG PO TABS: 5.0000 mg | ORAL_TABLET | Freq: Every evening | ORAL | 0 refills | Status: DC | PRN

## 2019-11-19 MED ORDER — MEDROXYPROGESTERONE ACETATE 150 MG/ML IM SUSP
150.0000 mg | INTRAMUSCULAR | Status: DC
  Administered 2019-11-19: 150 mg via INTRAMUSCULAR

## 2019-11-19 NOTE — Patient Instructions (Addendum)
Call to schedule your mammogram  [facility]  Address: [address], [place], [place] [address]  Phone: [phone]   Health Maintenance, Female Adopting a healthy lifestyle and getting preventive care are important in promoting health and wellness. Ask your health care provider about:  The right schedule for you to have regular tests and exams.  Things you can do on your own to prevent diseases and keep yourself healthy. What should I know about diet, weight, and exercise? Eat a healthy diet   Eat a diet that includes plenty of vegetables, fruits, low-fat dairy products, and lean protein.  Do not eat a lot of foods that are high in solid fats, added sugars, or sodium. Maintain a healthy weight Body mass index (BMI) is used to identify weight problems. It estimates body fat based on height and weight. Your health care provider can help determine your BMI and help you achieve or maintain a healthy weight. Get regular exercise Get regular exercise. This is one of the most important things you can do for your health. Most adults should:  Exercise for at least 150 minutes each week. The exercise should increase your heart rate and make you sweat (moderate-intensity exercise).  Do strengthening exercises at least twice a week. This is in addition to the moderate-intensity exercise.  Spend less time sitting. Even light physical activity can be beneficial. Watch cholesterol and blood lipids Have your blood tested for lipids and cholesterol at 43 years of age, then have this test every 5 years. Have your cholesterol levels checked more often if:  Your lipid or cholesterol levels are high.  You are older than 43 years of age.  You are at high risk for heart disease. What should I know about cancer screening? Depending on your health history and family history, you may need to have cancer screening at various ages. This may include screening  for:  Breast cancer.  Cervical cancer.  Colorectal cancer.  Skin cancer.  Lung cancer. What should I know about heart disease, diabetes, and high blood pressure? Blood pressure and heart disease  High blood pressure causes heart disease and increases the risk of stroke. This is more likely to develop in people who have high blood pressure readings, are of African descent, or are overweight.  Have your blood pressure checked: ? Every 3-5 years if you are 18-39 years of age. ? Every year if you are 40 years old or older. Diabetes Have regular diabetes screenings. This checks your fasting blood sugar level. Have the screening done:  Once every three years after age 40 if you are at a normal weight and have a low risk for diabetes.  More often and at a younger age if you are overweight or have a high risk for diabetes. What should I know about preventing infection? Hepatitis B If you have a higher risk for hepatitis B, you should be screened for this virus. Talk with your health care provider to find out if you are at risk for hepatitis B infection. Hepatitis C Testing is recommended for:  Everyone born from 1945 through 1965.  Anyone with known risk factors for hepatitis C. Sexually transmitted infections (STIs)  Get screened for STIs, including gonorrhea and chlamydia, if: ? You are sexually active and are younger than 43 years of age. ? You are older than 43 years of age and your health care provider tells you that you are at risk for this type of   infection. ? Your sexual activity has changed since you were last screened, and you are at increased risk for chlamydia or gonorrhea. Ask your health care provider if you are at risk.  Ask your health care provider about whether you are at high risk for HIV. Your health care provider may recommend a prescription medicine to help prevent HIV infection. If you choose to take medicine to prevent HIV, you should first get tested for HIV.  You should then be tested every 3 months for as long as you are taking the medicine. Pregnancy  If you are about to stop having your period (premenopausal) and you may become pregnant, seek counseling before you get pregnant.  Take 400 to 800 micrograms (mcg) of folic acid every day if you become pregnant.  Ask for birth control (contraception) if you want to prevent pregnancy. Osteoporosis and menopause Osteoporosis is a disease in which the bones lose minerals and strength with aging. This can result in bone fractures. If you are 65 years old or older, or if you are at risk for osteoporosis and fractures, ask your health care provider if you should:  Be screened for bone loss.  Take a calcium or vitamin D supplement to lower your risk of fractures.  Be given hormone replacement therapy (HRT) to treat symptoms of menopause. Follow these instructions at home: Lifestyle  Do not use any products that contain nicotine or tobacco, such as cigarettes, e-cigarettes, and chewing tobacco. If you need help quitting, ask your health care provider.  Do not use street drugs.  Do not share needles.  Ask your health care provider for help if you need support or information about quitting drugs. Alcohol use  Do not drink alcohol if: ? Your health care provider tells you not to drink. ? You are pregnant, may be pregnant, or are planning to become pregnant.  If you drink alcohol: ? Limit how much you use to 0-1 drink a day. ? Limit intake if you are breastfeeding.  Be aware of how much alcohol is in your drink. In the [place] one drink equals one 12 oz bottle of beer (355 mL), one 5 oz glass of wine (148 mL), or one 1 oz glass of hard liquor (44 mL). General instructions  Schedule regular health, dental, and eye exams.  Stay current with your vaccines.  Tell your health care provider if: ? You often feel depressed. ? You have ever been abused or do not feel safe at  home. Summary  Adopting a healthy lifestyle and getting preventive care are important in promoting health and wellness.  Follow your health care provider's instructions about healthy diet, exercising, and getting tested or screened for diseases.  Follow your health care provider's instructions on monitoring your cholesterol and blood pressure. This information is not intended to replace advice given to you by your health care provider. Make sure you discuss any questions you have with your health care provider. Document Revised: 06/28/2018 Document Reviewed: 06/28/2018 Elsevier Patient Education  2020 Elsevier Inc.  

## 2019-11-19 NOTE — Progress Notes (Signed)
BP 135/86 (BP Location: Left Arm, Patient Position: Sitting, Cuff Size: Normal)   Pulse (!) 105   Temp 98.6 F (37 C) (Oral)   Ht 5\' 5"  (1.651 m)   Wt 204 lb 6.4 oz (92.7 kg)   SpO2 99%   BMI 34.01 kg/m    Subjective:    Patient ID: Maria Plan, female    DOB: 1976/04/05, 44 y.o.   MRN: [id number]  HPI: IVON Little is a 44 y.o. female presenting on 11/19/2019 for comprehensive medical examination. Current medical complaints include:  HYPERLIPIDEMIA  Hyperlipidemia status: stable  Satisfied with current treatment? yes  Past cholesterol meds: none  Supplements: none  Aspirin: no  The 10-year ASCVD risk score Mikey Bussing DC Jr., et al., 2013) is: 1%  Values used to calculate the score:  Age: 69 years  Sex: Female  Is Non-Hispanic African American: Yes  Diabetic: No  Tobacco smoker: No  Systolic Blood Pressure: 196 mmHg  Is BP treated: No  HDL Cholesterol: 57 mg/dL  Total Cholesterol: 227 mg/dL  Chest pain: no   INSOMNIA- trazodone is not working. She notes that she is taking 100mg  and it is not working  Duration: chronic  Satisfied with sleep quality: no  Difficulty falling asleep: yes  Difficulty staying asleep: yes  Waking a few hours after sleep onset: yes  Early morning awakenings: yes  Daytime hypersomnolence: yes  Wakes feeling refreshed: no  Good sleep hygiene: yes  Apnea: no  Snoring: no  Depressed/anxious mood: yes  Recent stress: yes  Restless legs/nocturnal leg cramps: no  Chronic pain/arthritis: no  History of sleep study: no  Treatments attempted: trazodone    STD SCREENING- found out her partner was unfaithful. Would like to be screened  Sexual activity: Recent unprotected sexual encounter  Contraception: yes  Recent unprotected intercourse: yes  History of sexually transmitted diseases: no  Previous sexually transmitted disease screening: yes  Genital lesions: no  Penile discharge: no  Dysuria: no  Swollen lymph nodes: no  Fevers: no   Rash: no  DEPRESSION Mood status: stable Satisfied with current treatment?: yes Symptom severity: moderate  Duration of current treatment : chronic Side effects: no Medication compliance: excellent compliance Psychotherapy/counseling: no  Depressed mood: yes Anxious mood: yes Anhedonia: no Significant weight loss or gain: no Insomnia: yes  Fatigue: yes Feelings of worthlessness or guilt: yes Impaired concentration/indecisiveness: yes Suicidal ideations: no Hopelessness: no Crying spells: yes Depression screen [facility] 2/9 11/19/2019 10/31/2019 10/03/2019 01/15/2019 09/15/2018  Decreased Interest 0 0 0 0 2  Down, Depressed, Hopeless 0 2 1 0 0  PHQ - 2 Score 0 2 1 0 2  Altered sleeping 3 3 3 3 2   Tired, decreased energy 0 0 0 0 1  Change in appetite 0 1 0 2 1  Feeling bad or failure about yourself  3 2 0 0 0  Trouble concentrating 0 0 0 0 2  Moving slowly or fidgety/restless 0 0 0 0 0  Suicidal thoughts 0 0 0 0 0  PHQ-9 Score 6 8 4 5 8   Difficult doing work/chores Somewhat difficult Somewhat difficult Not difficult at all Not difficult at all Very difficult  Some recent data might be hidden     Menopausal Symptoms: yes- hot flashes, not bothering her  Depression Screen done today and results listed below:  Depression screen [facility] 2/9 11/19/2019 10/31/2019 10/03/2019 01/15/2019 09/15/2018  Decreased Interest 0 0 0 0 2  Down, Depressed, Hopeless 0 2 1 0  0  PHQ - 2 Score 0 2 1 0 2  Altered sleeping 3 3 3 3 2   Tired, decreased energy 0 0 0 0 1  Change in appetite 0 1 0 2 1  Feeling bad or failure about yourself  3 2 0 0 0  Trouble concentrating 0 0 0 0 2  Moving slowly or fidgety/restless 0 0 0 0 0  Suicidal thoughts 0 0 0 0 0  PHQ-9 Score 6 8 4 5 8   Difficult doing work/chores Somewhat difficult Somewhat difficult Not difficult at all Not difficult at all Very difficult  Some recent data might be hidden     Past Medical History:  Past Medical History:  Diagnosis Date  .  Migraine     Surgical History:  Past Surgical History:  Procedure Laterality Date  . BREAST SURGERY     Lumpectomy    Medications:  Current Outpatient Medications on File Prior to Visit  Medication Sig  . omeprazole (PRILOSEC) 20 MG capsule Take 1 capsule by mouth once daily  . sertraline (ZOLOFT) 100 MG tablet Take 1 tablet (100 mg total) by mouth daily.   No current facility-administered medications on file prior to visit.    Allergies:  No Known Allergies  Social History:  Social History   Socioeconomic History  . Marital status: Single    Spouse name: Not on file  . Number of children: Not on file  . Years of education: Not on file  . Highest education level: Not on file  Occupational History  . Not on file  Tobacco Use  . Smoking status: Never Smoker  . Smokeless tobacco: Never Used  Substance and Sexual Activity  . Alcohol use: Yes  . Drug use: No  . Sexual activity: Never  Other Topics Concern  . Not on file  Social History Narrative  . Not on file   Social Determinants of Health   Financial Resource Strain:   . Difficulty of Paying Living Expenses:   Food Insecurity:   . Worried About in the Last Year:   . in the Last Year:   Transportation Needs:   . Programme researcher, broadcasting/film/video (Medical):   Barista Lack of Transportation (Non-Medical):   Physical Activity:   . Days of Exercise per Week:   . Minutes of Exercise per Session:   Stress:   . Feeling of Stress :   Social Connections:   . Frequency of Communication with Friends and Family:   . Frequency of Social Gatherings with Friends and Family:   . Attends Religious Services:   . Active Member of Clubs or Organizations:   . Attends Freight forwarder Meetings:   Marland Kitchen Marital Status:   Intimate Partner Violence:   . Fear of Current or Ex-Partner:   . Emotionally Abused:   Banker Physically Abused:   . Sexually Abused:    Social History   Tobacco Use  Smoking Status  Never Smoker  Smokeless Tobacco Never Used   Social History   Substance and Sexual Activity  Alcohol Use Yes    Family History:  Family History  Problem Relation Age of Onset  . Diabetes Mother   . Hyperlipidemia Mother   . Hypertension Mother   . Hypertension Father   . Hypertension Brother     Past medical history, surgical history, medications, allergies, family history and social history reviewed with patient today and changes made to appropriate areas of the chart.  Review of Systems  Constitutional: Negative.   HENT: Negative.   Eyes: Negative.   Respiratory: Negative.   Cardiovascular: Negative.   Gastrointestinal: Negative.   Genitourinary: Negative.   Musculoskeletal: Negative.   Skin: Negative.   Neurological: Positive for tingling (in her L hand and her toes). Negative for dizziness, tremors, sensory change, speech change, focal weakness, seizures, loss of consciousness, weakness and headaches.  Endo/Heme/Allergies: Negative.   Psychiatric/Behavioral: Negative for depression, hallucinations, memory loss, substance abuse and suicidal ideas. The patient is nervous/anxious and has insomnia.   All other ROS negative except what is listed above and in the HPI.      Objective:    BP 135/86 (BP Location: Left Arm, Patient Position: Sitting, Cuff Size: Normal)   Pulse (!) 105   Temp 98.6 F (37 C) (Oral)   Ht  (1.651 m)   Wt 204 lb 6.4 oz (92.7 kg)   SpO2 99%   BMI 34.01 kg/m   Wt Readings from Last 3 Encounters:  11/19/19 204 lb 6.4 oz (92.7 kg)  10/31/19 191 lb (86.6 kg)  10/03/19 189 lb (85.7 kg)    Physical Exam Vitals and nursing note reviewed.  Constitutional:      General: She is not in acute distress.    Appearance: Normal appearance. She is not ill-appearing, toxic-appearing or diaphoretic.  HENT:     Head: Normocephalic and atraumatic.     Right Ear: Tympanic membrane, ear canal and external ear normal. There is no impacted cerumen.      Left Ear: Tympanic membrane, ear canal and external ear normal. There is no impacted cerumen.     Nose: Nose normal. No congestion or rhinorrhea.     Mouth/Throat:     Mouth: Mucous membranes are moist.     Pharynx: Oropharynx is clear. No oropharyngeal exudate or posterior oropharyngeal erythema.  Eyes:     General: No scleral icterus.       Right eye: No discharge.        Left eye: No discharge.     Extraocular Movements: Extraocular movements intact.     Conjunctiva/sclera: Conjunctivae normal.     Pupils: Pupils are equal, round, and reactive to light.  Neck:     Vascular: No carotid bruit.  Cardiovascular:     Rate and Rhythm: Normal rate and regular rhythm.     Pulses: Normal pulses.     Heart sounds: No murmur. No friction rub. No gallop.   Pulmonary:     Effort: Pulmonary effort is normal. No respiratory distress.     Breath sounds: Normal breath sounds. No stridor. No wheezing, rhonchi or rales.  Chest:     Chest wall: No tenderness.  Abdominal:     General: Abdomen is flat. Bowel sounds are normal. There is no distension.     Palpations: Abdomen is soft. There is no mass.     Tenderness: There is no abdominal tenderness. There is no right CVA tenderness, left CVA tenderness, guarding or rebound.     Hernia: No hernia is present.  Genitourinary:    Comments: Breast and pelvic exams deferred with shared decision making Musculoskeletal:        General: No swelling, tenderness, deformity or signs of injury.     Cervical back: Normal range of motion and neck supple. No rigidity. No muscular tenderness.     Right lower leg: No edema.     Left lower leg: No edema.  Lymphadenopathy:  Cervical: No cervical adenopathy.  Skin:    General: Skin is warm and dry.     Capillary Refill: Capillary refill takes less than 2 seconds.     Coloration: Skin is not jaundiced or pale.     Findings: No bruising, erythema, lesion or rash.  Neurological:     General: No focal deficit  present.     Mental Status: She is alert and oriented to person, place, and time. Mental status is at baseline.     Cranial Nerves: No cranial nerve deficit.     Sensory: No sensory deficit.     Motor: No weakness.     Coordination: Coordination normal.     Gait: Gait normal.     Deep Tendon Reflexes: Reflexes normal.  Psychiatric:        Mood and Affect: Mood normal.        Behavior: Behavior normal.        Thought Content: Thought content normal.        Judgment: Judgment normal.     Results for orders placed or performed in visit on 11/19/19  Microscopic Examination   URINE  Result Value Ref Range   WBC, UA 0-5 0 - 5 /hpf   RBC 3-10 (A) 0 - 2 /hpf   Epithelial Cells (non renal) 0-10 0 - 10 /hpf   Casts Present None seen /lpf   Cast Type Granular casts (A) N/A   Crystals Present N/A   Crystal Type Amorphous Sediment N/A   Mucus, UA Present Not Estab.   Bacteria, UA Few (A) None seen/Few  CBC with Differential/Platelet  Result Value Ref Range   WBC 7.6 3.4 - 10.8 x10E3/uL   RBC 4.72 3.77 - 5.28 x10E6/uL   Hemoglobin 13.2 11.1 - 15.9 g/dL   Hematocrit 16.140.7 09.634.0 - 46.6 %   MCV 86 79 - 97 fL   MCH 28.0 26.6 - 33.0 pg   MCHC 32.4 31.5 - 35.7 g/dL   RDW 04.513.4 40.911.7 - 81.115.4 %   Platelets 330 150 - 450 x10E3/uL   Neutrophils 51 Not Estab. %   Lymphs 40 Not Estab. %   Monocytes 7 Not Estab. %   Eos 1 Not Estab. %   Basos 1 Not Estab. %   Neutrophils Absolute 3.9 1.4 - 7.0 x10E3/uL   Lymphocytes Absolute 3.0 0.7 - 3.1 x10E3/uL   Monocytes Absolute 0.5 0.1 - 0.9 x10E3/uL   EOS (ABSOLUTE) 0.1 0.0 - 0.4 x10E3/uL   Basophils Absolute 0.0 0.0 - 0.2 x10E3/uL   Immature Granulocytes 0 Not Estab. %   Immature Grans (Abs) 0.0 0.0 - 0.1 x10E3/uL  Comprehensive metabolic panel  Result Value Ref Range   Glucose 164 (H) 65 - 99 mg/dL   BUN 6 6 - 24 mg/dL   Creatinine, Ser 9.140.85 0.57 - 1.00 mg/dL   GFR calc non Af Amer 85 >59 mL/min/1.73   GFR calc Af Amer 98 >59 mL/min/1.73    BUN/Creatinine Ratio 7 (L) 9 - 23   Sodium 139 134 - 144 mmol/L   Potassium 3.4 (L) 3.5 - 5.2 mmol/L   Chloride 101 96 - 106 mmol/L   CO2 21 20 - 29 mmol/L   Calcium 9.8 8.7 - 10.2 mg/dL   Total Protein 7.6 6.0 - 8.5 g/dL   Albumin 4.3 3.8 - 4.8 g/dL   Globulin, Total 3.3 1.5 - 4.5 g/dL   Albumin/Globulin Ratio 1.3 1.2 - 2.2   Bilirubin Total 0.4 0.0 - 1.2 mg/dL  Alkaline Phosphatase 92 39 - 117 IU/L   AST 18 0 - 40 IU/L   ALT 19 0 - 32 IU/L  Lipid Panel w/o Chol/HDL Ratio  Result Value Ref Range   Cholesterol, Total 238 (H) 100 - 199 mg/dL   Triglycerides 846 (H) 0 - 149 mg/dL   HDL 48 >65 mg/dL   VLDL Cholesterol Cal 53 (H) 5 - 40 mg/dL   LDL Chol Calc (NIH) 993 (H) 0 - 99 mg/dL  TSH  Result Value Ref Range   TSH 1.150 0.450 - 4.500 uIU/mL  Urinalysis, Routine w reflex microscopic  Result Value Ref Range   Specific Gravity, UA >1.030 (H) 1.005 - 1.030   pH, UA 5.5 5.0 - 7.5   Color, UA Yellow Yellow   Appearance Ur Clear Clear   Leukocytes,UA Negative Negative   Protein,UA Trace (A) Negative/Trace   Glucose, UA 1+ (A) Negative   Ketones, UA Trace (A) Negative   RBC, UA 2+ (A) Negative   Bilirubin, UA Negative Negative   Urobilinogen, Ur 0.2 0.2 - 1.0 mg/dL   Nitrite, UA Negative Negative   Microscopic Examination See below:   HIV Antibody (routine testing w rflx)  Result Value Ref Range   HIV Screen 4th Generation wRfx Non Reactive Non Reactive  HSV(herpes simplex vrs) 1+2 ab-IgG  Result Value Ref Range   HSV 1 Glycoprotein G Ab, IgG 57.60 (H) 0.00 - 0.90 index   HSV 2 IgG, Type Spec <0.91 0.00 - 0.90 index  Hepatitis, Acute  Result Value Ref Range   Hep A IgM Negative Negative   Hepatitis B Surface Ag Negative Negative   Hep B C IgM Negative Negative   Hep C Virus Ab 0.2 0.0 - 0.9 s/co ratio  RPR  Result Value Ref Range   RPR Ser Ql Non Reactive Non Reactive      Assessment & Plan:   Problem List Items Addressed This Visit      Other   Depression,  major, recurrent, moderate (HCC)    Under good control on current regimen. Continue current regimen. Continue to monitor. Call with any concerns. Refills given.        Hyperlipidemia    Rechecking levels today. Await results. Call with any concerns.       Relevant Orders   Comprehensive metabolic panel (Completed)   Lipid Panel w/o Chol/HDL Ratio (Completed)   Psychophysiological insomnia    Not doing well. Trazodone not helping. Will start low dose ambien to be taken during the time she has to go to court. Recheck 1 month. Call with any concerns.        Other Visit Diagnoses    Routine general medical examination at a health care facility    -  Primary   Vaccines up to date. Screening labs checked today. Mammogram ordered. Pap up to date. continue diet and exercise. Call with any concerns.    Relevant Orders   CBC with Differential/Platelet (Completed)   Comprehensive metabolic panel (Completed)   Lipid Panel w/o Chol/HDL Ratio (Completed)   TSH (Completed)   Urinalysis, Routine w reflex microscopic (Completed)   Routine screening for STI (sexually transmitted infection)       Labs drawn today. Await results. Call with any concerns.    Relevant Orders   HIV Antibody (routine testing w rflx) (Completed)   GC/Chlamydia Probe Amp   HSV(herpes simplex vrs) 1+2 ab-IgG (Completed)   Hepatitis, Acute (Completed)   RPR (Completed)   Encounter for  surveillance of injectable contraceptive       Due for her depo. Ordered today.   Relevant Medications   medroxyPROGESTERone (DEPO-PROVERA) injection 150 mg (Start on 02/20/2020 12:00 AM)       Follow up plan: Return in about 4 weeks (around 12/17/2019).   LABORATORY TESTING:  - Pap smear: up to date  IMMUNIZATIONS:  - Tdap: Tetanus vaccination status reviewed: last tetanus booster within 10 years. - Influenza: Up to date - Pneumovax: Not applicable  SCREENING: -Mammogram: Order in. Encouraged her to go   PATIENT COUNSELING:    Advised to take 1 mg of folate supplement per day if capable of pregnancy.   Sexuality: Discussed sexually transmitted diseases, partner selection, use of condoms, avoidance of unintended pregnancy  and contraceptive alternatives.   Advised to avoid cigarette smoking.  I discussed with the patient that most people either abstain from alcohol or drink within safe limits (<=14/week and <=4 drinks/occasion for males, <=7/weeks and <= 3 drinks/occasion for females) and that the risk for alcohol disorders and other health effects rises proportionally with the number of drinks per week and how often a drinker exceeds daily limits.  Discussed cessation/primary prevention of drug use and availability of treatment for abuse.   Diet: Encouraged to adjust caloric intake to maintain  or achieve ideal body weight, to reduce intake of dietary saturated fat and total fat, to limit sodium intake by avoiding high sodium foods and not adding table salt, and to maintain adequate dietary potassium and calcium preferably from fresh fruits, vegetables, and low-fat dairy products.    stressed the importance of regular exercise  Injury prevention: Discussed safety belts, safety helmets, smoke detector, smoking near bedding or upholstery.   Dental health: Discussed importance of regular tooth brushing, flossing, and dental visits.    NEXT PREVENTATIVE PHYSICAL DUE IN 1 YEAR. Return in about 4 weeks (around 12/17/2019).

## 2019-11-20 ENCOUNTER — Encounter: Payer: Self-pay | Admitting: Family Medicine

## 2019-11-20 DIAGNOSIS — F5104 Psychophysiologic insomnia: Secondary | ICD-10-CM | POA: Insufficient documentation

## 2019-11-20 LAB — CBC WITH DIFFERENTIAL/PLATELET
Basophils Absolute: 0 10*3/uL (ref 0.0–0.2)
Basos: 1 %
EOS (ABSOLUTE): 0.1 10*3/uL (ref 0.0–0.4)
Eos: 1 %
Hematocrit: 40.7 % (ref 34.0–46.6)
Hemoglobin: 13.2 g/dL (ref 11.1–15.9)
Immature Grans (Abs): 0 10*3/uL (ref 0.0–0.1)
Immature Granulocytes: 0 %
Lymphocytes Absolute: 3 10*3/uL (ref 0.7–3.1)
Lymphs: 40 %
MCH: 28 pg (ref 26.6–33.0)
MCHC: 32.4 g/dL (ref 31.5–35.7)
MCV: 86 fL (ref 79–97)
Monocytes Absolute: 0.5 10*3/uL (ref 0.1–0.9)
Monocytes: 7 %
Neutrophils Absolute: 3.9 10*3/uL (ref 1.4–7.0)
Neutrophils: 51 %
Platelets: 330 10*3/uL (ref 150–450)
RBC: 4.72 x10E6/uL (ref 3.77–5.28)
RDW: 13.4 % (ref 11.7–15.4)
WBC: 7.6 10*3/uL (ref 3.4–10.8)

## 2019-11-20 LAB — HEPATITIS PANEL, ACUTE
Hep A IgM: NEGATIVE
Hep B C IgM: NEGATIVE
Hep C Virus Ab: 0.2 s/co ratio (ref 0.0–0.9)
Hepatitis B Surface Ag: NEGATIVE

## 2019-11-20 LAB — COMPREHENSIVE METABOLIC PANEL
ALT: 19 IU/L (ref 0–32)
AST: 18 IU/L (ref 0–40)
Albumin/Globulin Ratio: 1.3 (ref 1.2–2.2)
Albumin: 4.3 g/dL (ref 3.8–4.8)
Alkaline Phosphatase: 92 IU/L (ref 39–117)
BUN/Creatinine Ratio: 7 — ABNORMAL LOW (ref 9–23)
BUN: 6 mg/dL (ref 6–24)
Bilirubin Total: 0.4 mg/dL (ref 0.0–1.2)
CO2: 21 mmol/L (ref 20–29)
Calcium: 9.8 mg/dL (ref 8.7–10.2)
Chloride: 101 mmol/L (ref 96–106)
Creatinine, Ser: 0.85 mg/dL (ref 0.57–1.00)
GFR calc Af Amer: 98 mL/min/{1.73_m2} (ref >59)
GFR calc non Af Amer: 85 mL/min/{1.73_m2} (ref >59)
Globulin, Total: 3.3 g/dL (ref 1.5–4.5)
Glucose: 164 mg/dL — ABNORMAL HIGH (ref 65–99)
Potassium: 3.4 mmol/L — ABNORMAL LOW (ref 3.5–5.2)
Sodium: 139 mmol/L (ref 134–144)
Total Protein: 7.6 g/dL (ref 6.0–8.5)

## 2019-11-20 LAB — LIPID PANEL W/O CHOL/HDL RATIO
Cholesterol, Total: 238 mg/dL — ABNORMAL HIGH (ref 100–199)
HDL: 48 mg/dL (ref >39)
LDL Chol Calc (NIH): 137 mg/dL — ABNORMAL HIGH (ref 0–99)
Triglycerides: 293 mg/dL — ABNORMAL HIGH (ref 0–149)
VLDL Cholesterol Cal: 53 mg/dL — ABNORMAL HIGH (ref 5–40)

## 2019-11-20 LAB — RPR: RPR Ser Ql: NONREACTIVE

## 2019-11-20 LAB — TSH: TSH: 1.15 u[IU]/mL (ref 0.450–4.500)

## 2019-11-20 LAB — HIV ANTIBODY (ROUTINE TESTING W REFLEX): HIV Screen 4th Generation wRfx: NONREACTIVE

## 2019-11-20 LAB — HSV(HERPES SIMPLEX VRS) I + II AB-IGG
HSV 1 Glycoprotein G Ab, IgG: 57.6 index — ABNORMAL HIGH (ref 0.00–0.90)
HSV 2 IgG, Type Spec: <0.91 index (ref 0.00–0.90)

## 2019-11-20 MED ORDER — MEDROXYPROGESTERONE ACETATE 150 MG/ML IM SUSP
150.0000 mg | INTRAMUSCULAR | Status: AC
  Administered 2020-02-08 – 2020-08-20 (×3): 150 mg via INTRAMUSCULAR

## 2019-11-20 NOTE — Assessment & Plan Note (Signed)
Under good control on current regimen. Continue current regimen. Continue to monitor. Call with any concerns. Refills given.   

## 2019-11-20 NOTE — Assessment & Plan Note (Signed)
Rechecking levels today. Await results. Call with any concerns.  

## 2019-11-20 NOTE — Assessment & Plan Note (Signed)
Not doing well. Trazodone not helping. Will start low dose ambien to be taken during the time she has to go to court. Recheck 1 month. Call with any concerns.

## 2019-11-21 LAB — GC/CHLAMYDIA PROBE AMP
Chlamydia trachomatis, NAA: NEGATIVE
Neisseria Gonorrhoeae by PCR: NEGATIVE

## 2019-11-22 ENCOUNTER — Encounter: Payer: Self-pay | Admitting: Family Medicine

## 2019-12-10 ENCOUNTER — Encounter: Payer: Self-pay | Admitting: Family Medicine

## 2019-12-11 ENCOUNTER — Other Ambulatory Visit: Payer: Self-pay

## 2019-12-11 ENCOUNTER — Encounter: Payer: Self-pay | Admitting: Unknown Physician Specialty

## 2019-12-11 ENCOUNTER — Ambulatory Visit: Payer: 59 | Admitting: Unknown Physician Specialty

## 2019-12-11 ENCOUNTER — Telehealth: Payer: 59 | Admitting: Unknown Physician Specialty

## 2019-12-11 ENCOUNTER — Encounter: Payer: Self-pay | Admitting: Family Medicine

## 2019-12-11 VITALS — BP 133/92 | HR 117 | Temp 100.0°F | Wt 200.4 lb

## 2019-12-11 DIAGNOSIS — R509 Fever, unspecified: Secondary | ICD-10-CM

## 2019-12-11 DIAGNOSIS — R42 Dizziness and giddiness: Secondary | ICD-10-CM

## 2019-12-11 LAB — CBC WITH DIFFERENTIAL/PLATELET
Hematocrit: 43.7 % (ref 34.0–46.6)
Hemoglobin: 14.4 g/dL (ref 11.1–15.9)
Lymphocytes Absolute: 3.5 10*3/uL — ABNORMAL HIGH (ref 0.7–3.1)
Lymphs: 43 %
MCH: 28.5 pg (ref 26.6–33.0)
MCHC: 33 g/dL (ref 31.5–35.7)
MCV: 87 fL (ref 79–97)
MID (Absolute): 0.7 10*3/uL (ref 0.1–1.6)
MID: 9 %
Neutrophils Absolute: 3.9 10*3/uL (ref 1.4–7.0)
Neutrophils: 48 %
Platelets: 345 10*3/uL (ref 150–450)
RBC: 5.05 x10E6/uL (ref 3.77–5.28)
RDW: 14.4 % (ref 11.7–15.4)
WBC: 8.1 10*3/uL (ref 3.4–10.8)

## 2019-12-11 MED ORDER — MECLIZINE HCL 25 MG PO TABS: 25.0000 mg | ORAL_TABLET | Freq: Three times a day (TID) | ORAL | 0 refills | Status: DC | PRN

## 2019-12-11 NOTE — Patient Instructions (Signed)

## 2019-12-11 NOTE — Progress Notes (Signed)
BP (!) 133/92   Pulse (!) 117   Temp 100 F (37.8 C) (Oral)   Wt 200 lb 6.4 oz (90.9 kg)   SpO2 100%   BMI 33.35 kg/m    Subjective:    Patient ID: Maria Little, female    DOB: May 15, 1976, 44 y.o.   MRN: [id number]  HPI: Maria Little is a 44 y.o. female  Chief Complaint  Patient presents with  . Dizziness    pt states she has been feeling dizzy, light headed, and feeling faint since yesterday morning   Dizziness This is a new problem. The current episode started yesterday. The problem occurs constantly. The problem has been unchanged (but notices skin is not clammy anymore). Associated symptoms include diaphoresis, a fever and vertigo. Pertinent negatives include no abdominal pain, anorexia, arthralgias, change in bowel habit, chest pain, chills, congestion, coughing, fatigue, headaches, joint swelling, myalgias, nausea, neck pain, numbness, rash, sore throat, swollen glands, urinary symptoms, visual change, vomiting or weakness. Nothing aggravates the symptoms. She has tried nothing for the symptoms.   States she has had this before, doesn't remember when but went to the ER.  Endorses headache since she has been here  She has been fully vaccinated since mid March   Relevant past medical, surgical, family and social history reviewed and updated as indicated. Interim medical history since our last visit reviewed. Allergies and medications reviewed and updated.  Review of Systems  Constitutional: Positive for diaphoresis and fever. Negative for chills and fatigue.  HENT: Negative for congestion and sore throat.   Respiratory: Negative for cough.   Cardiovascular: Negative for chest pain.  Gastrointestinal: Negative for abdominal pain, anorexia, change in bowel habit, nausea and vomiting.  Musculoskeletal: Negative for arthralgias, joint swelling, myalgias and neck pain.  Skin: Negative for rash.  Neurological: Positive for dizziness and vertigo. Negative for  weakness, numbness and headaches.   Review of medications - Takes Ambien on work nights.  No change in Sertraline and has been on 100 mg since mid March.    Per HPI unless specifically indicated above     Objective:    BP (!) 133/92   Pulse (!) 117   Temp 100 F (37.8 C) (Oral)   Wt 200 lb 6.4 oz (90.9 kg)   SpO2 100%   BMI 33.35 kg/m   Wt Readings from Last 3 Encounters:  12/11/19 200 lb 6.4 oz (90.9 kg)  11/19/19 204 lb 6.4 oz (92.7 kg)  10/31/19 191 lb (86.6 kg)    Physical Exam Constitutional:      General: She is not in acute distress.    Appearance: Normal appearance. She is well-developed.  HENT:     Head: Normocephalic and atraumatic.  Eyes:     General: Lids are normal. No scleral icterus.       Right eye: No discharge.        Left eye: No discharge.     Conjunctiva/sclera: Conjunctivae normal.  Neck:     Vascular: No carotid bruit or JVD.  Cardiovascular:     Rate and Rhythm: Normal rate and regular rhythm.     Heart sounds: Normal heart sounds.  Pulmonary:     Effort: Pulmonary effort is normal.     Breath sounds: Normal breath sounds.  Abdominal:     Palpations: There is no hepatomegaly or splenomegaly.  Musculoskeletal:        General: Normal range of motion.     Cervical back: Normal range  of motion and neck supple.  Skin:    General: Skin is warm and dry.     Coloration: Skin is not pale.     Findings: No rash.  Neurological:     Mental Status: She is alert and oriented to person, place, and time.     Coordination: Coordination normal.     Gait: Gait abnormal.  Psychiatric:        Behavior: Behavior normal.        Thought Content: Thought content normal.        Judgment: Judgment normal.    EKG normal sinus rhythm without acute changes WBC is normal  Results for orders placed or performed in visit on 11/19/19  GC/Chlamydia Probe Amp   Specimen: Urine   UR  Result Value Ref Range   Chlamydia trachomatis, NAA Negative Negative    Neisseria Gonorrhoeae by PCR Negative Negative  Microscopic Examination   URINE  Result Value Ref Range   WBC, UA 0-5 0 - 5 /hpf   RBC 3-10 (A) 0 - 2 /hpf   Epithelial Cells (non renal) 0-10 0 - 10 /hpf   Casts Present None seen /lpf   Cast Type Granular casts (A) N/A   Crystals Present N/A   Crystal Type Amorphous Sediment N/A   Mucus, UA Present Not Estab.   Bacteria, UA Few (A) None seen/Few  CBC with Differential/Platelet  Result Value Ref Range   WBC 7.6 3.4 - 10.8 x10E3/uL   RBC 4.72 3.77 - 5.28 x10E6/uL   Hemoglobin 13.2 11.1 - 15.9 g/dL   Hematocrit 32.2 02.5 - 46.6 %   MCV 86 79 - 97 fL   MCH 28.0 26.6 - 33.0 pg   MCHC 32.4 31.5 - 35.7 g/dL   RDW 42.7 06.2 - 37.6 %   Platelets 330 150 - 450 x10E3/uL   Neutrophils 51 Not Estab. %   Lymphs 40 Not Estab. %   Monocytes 7 Not Estab. %   Eos 1 Not Estab. %   Basos 1 Not Estab. %   Neutrophils Absolute 3.9 1.4 - 7.0 x10E3/uL   Lymphocytes Absolute 3.0 0.7 - 3.1 x10E3/uL   Monocytes Absolute 0.5 0.1 - 0.9 x10E3/uL   EOS (ABSOLUTE) 0.1 0.0 - 0.4 x10E3/uL   Basophils Absolute 0.0 0.0 - 0.2 x10E3/uL   Immature Granulocytes 0 Not Estab. %   Immature Grans (Abs) 0.0 0.0 - 0.1 x10E3/uL  Comprehensive metabolic panel  Result Value Ref Range   Glucose 164 (H) 65 - 99 mg/dL   BUN 6 6 - 24 mg/dL   Creatinine, Ser 2.83 0.57 - 1.00 mg/dL   GFR calc non Af Amer 85 >59 mL/min/1.73   GFR calc Af Amer 98 >59 mL/min/1.73   BUN/Creatinine Ratio 7 (L) 9 - 23   Sodium 139 134 - 144 mmol/L   Potassium 3.4 (L) 3.5 - 5.2 mmol/L   Chloride 101 96 - 106 mmol/L   CO2 21 20 - 29 mmol/L   Calcium 9.8 8.7 - 10.2 mg/dL   Total Protein 7.6 6.0 - 8.5 g/dL   Albumin 4.3 3.8 - 4.8 g/dL   Globulin, Total 3.3 1.5 - 4.5 g/dL   Albumin/Globulin Ratio 1.3 1.2 - 2.2   Bilirubin Total 0.4 0.0 - 1.2 mg/dL   Alkaline Phosphatase 92 39 - 117 IU/L   AST 18 0 - 40 IU/L   ALT 19 0 - 32 IU/L  Lipid Panel w/o Chol/HDL Ratio  Result Value Ref Range  Cholesterol, Total 238 (H) 100 - 199 mg/dL   Triglycerides 293 (H) 0 - 149 mg/dL   HDL 48 >39 mg/dL   VLDL Cholesterol Cal 53 (H) 5 - 40 mg/dL   LDL Chol Calc (NIH) 137 (H) 0 - 99 mg/dL  TSH  Result Value Ref Range   TSH 1.150 0.450 - 4.500 uIU/mL  Urinalysis, Routine w reflex microscopic  Result Value Ref Range   Specific Gravity, UA >1.030 (H) 1.005 - 1.030   pH, UA 5.5 5.0 - 7.5   Color, UA Yellow Yellow   Appearance Ur Clear Clear   Leukocytes,UA Negative Negative   Protein,UA Trace (A) Negative/Trace   Glucose, UA 1+ (A) Negative   Ketones, UA Trace (A) Negative   RBC, UA 2+ (A) Negative   Bilirubin, UA Negative Negative   Urobilinogen, Ur 0.2 0.2 - 1.0 mg/dL   Nitrite, UA Negative Negative   Microscopic Examination See below:   HIV Antibody (routine testing w rflx)  Result Value Ref Range   HIV Screen 4th Generation wRfx Non Reactive Non Reactive  HSV(herpes simplex vrs) 1+2 ab-IgG  Result Value Ref Range   HSV 1 Glycoprotein G Ab, IgG 57.60 (H) 0.00 - 0.90 index   HSV 2 IgG, Type Spec <0.91 0.00 - 0.90 index  Hepatitis, Acute  Result Value Ref Range   Hep A IgM Negative Negative   Hepatitis B Surface Ag Negative Negative   Hep B C IgM Negative Negative   Hep C Virus Ab 0.2 0.0 - 0.9 s/co ratio  RPR  Result Value Ref Range   RPR Ser Ql Non Reactive Non Reactive      Assessment & Plan:   Problem List Items Addressed This Visit    None    Visit Diagnoses    Fever, unspecified fever cause    -  Primary   CBC with normal WBC.  Recommend Covid testing   Relevant Orders   CBC With Differential/Platelet   Dizziness       Sudden onset of dizzyness but not positional.  EKG without change.  Will refer to ENT.  Meclizine.   Relevant Orders   EKG 12-Lead (Completed)   Comprehensive metabolic panel     Recommend Covid testing, home Epley maneuvers, and to the ER for worsening symptoms    Follow up plan: Return if symptoms worsen or fail to improve.

## 2019-12-12 ENCOUNTER — Encounter: Payer: Self-pay | Admitting: Unknown Physician Specialty

## 2019-12-12 ENCOUNTER — Telehealth: Payer: Self-pay | Admitting: Unknown Physician Specialty

## 2019-12-12 LAB — COMPREHENSIVE METABOLIC PANEL
ALT: 20 IU/L (ref 0–32)
AST: 20 IU/L (ref 0–40)
Albumin/Globulin Ratio: 1.3 (ref 1.2–2.2)
Albumin: 4.4 g/dL (ref 3.8–4.8)
Alkaline Phosphatase: 83 IU/L (ref 48–121)
BUN/Creatinine Ratio: 9 (ref 9–23)
BUN: 7 mg/dL (ref 6–24)
Bilirubin Total: 0.5 mg/dL (ref 0.0–1.2)
CO2: 20 mmol/L (ref 20–29)
Calcium: 9.8 mg/dL (ref 8.7–10.2)
Chloride: 106 mmol/L (ref 96–106)
Creatinine, Ser: 0.81 mg/dL (ref 0.57–1.00)
GFR calc Af Amer: 103 mL/min/{1.73_m2} (ref >59)
GFR calc non Af Amer: 89 mL/min/{1.73_m2} (ref >59)
Globulin, Total: 3.5 g/dL (ref 1.5–4.5)
Glucose: 173 mg/dL — ABNORMAL HIGH (ref 65–99)
Potassium: 4 mmol/L (ref 3.5–5.2)
Sodium: 140 mmol/L (ref 134–144)
Total Protein: 7.9 g/dL (ref 6.0–8.5)

## 2019-12-12 NOTE — Telephone Encounter (Signed)
Phone follow-up today for dizzyness.  Pt is doing better.  Discussed pt with Dr. Laural Benes.  Will continue with present care, emphasized to take Sertraline at the same time every day.  Meclizine prn.

## 2019-12-15 ENCOUNTER — Encounter: Payer: Self-pay | Admitting: Family Medicine

## 2019-12-18 ENCOUNTER — Encounter: Payer: Self-pay | Admitting: Family Medicine

## 2019-12-20 ENCOUNTER — Other Ambulatory Visit: Payer: Self-pay | Admitting: Family Medicine

## 2019-12-21 ENCOUNTER — Ambulatory Visit: Payer: Self-pay

## 2019-12-21 NOTE — Chronic Care Management (AMB) (Signed)
  Care Management   Follow Up Note   12/21/2019 Name: BERNEDETTE AUSTON MRN: [id number] DOB: [birth year]  Referred by: Dorcas Carrow, DO Reason for referral : Care Coordination   Danene L Lampley is a 44 y.o. year old female who is a primary care patient of Dorcas Carrow, DO. The care management team was consulted for assistance with care management and care coordination needs.    Review of patient status, including review of consultants reports, relevant laboratory and other test results, and collaboration with appropriate care team members and the patient's provider was performed as part of comprehensive patient evaluation and provision of chronic care management services.    LCSW completed CCM outreach attempt today but was unable to reach patient successfully. A HIPPA compliant voice message was left encouraging patient to return call once available. LCSW rescheduled CCM SW appointment as well.  A HIPPA compliant phone message was left for the patient providing contact information and requesting a return call.   Dickie La, BSW, MSW, LCSW [facility] Care Management [place]  [facility] [email or web address] Phone: [phone]

## 2019-12-21 NOTE — Telephone Encounter (Signed)
Routing to provider  

## 2019-12-21 NOTE — Telephone Encounter (Signed)
PT has apt on 12/26/2019.

## 2019-12-21 NOTE — Telephone Encounter (Signed)
Unable to lvm

## 2019-12-21 NOTE — Telephone Encounter (Signed)
Needs appointment

## 2019-12-21 NOTE — Telephone Encounter (Signed)
Called to schedule, no answer, left vm 

## 2019-12-21 NOTE — Telephone Encounter (Signed)
Requested medication (s) are due for refill today: yes  Requested medication (s) are on the active medication list: yes  Last refill:  11/19/19  Future visit scheduled: yes  Notes to clinic:  not delelgated    Requested Prescriptions  Pending Prescriptions Disp Refills   zolpidem (AMBIEN) 5 MG tablet [Pharmacy Med Name: Zolpidem Tartrate 5 MG Oral Tablet] 30 tablet 0    Sig: TAKE 1 TABLET BY MOUTH AT BEDTIME AS NEEDED FOR SLEEP      Not Delegated - Psychiatry:  Anxiolytics/Hypnotics Failed - 12/20/2019  7:52 PM      Failed - This refill cannot be delegated      Failed - Urine Drug Screen completed in last 360 days.      Passed - Valid encounter within last 6 months    Recent Outpatient Visits           1 week ago Fever, unspecified fever cause   [facility] Gabriel Cirri, NP   1 month ago Routine general medical examination at a health care facility   [facility] [place] P, DO   1 month ago Depression, major, recurrent, moderate (HCC)   [facility] Coleman, Megan P, DO   2 months ago Depression, major, recurrent, moderate (HCC)   [facility] Charlo, Megan P, DO   11 months ago Gastroesophageal reflux disease, esophagitis presence not specified   [facility] McCormick, Oralia Rud, DO       Future Appointments             In 2 weeks Laural Benes, Oralia Rud, DO [facility]

## 2019-12-24 ENCOUNTER — Telehealth: Payer: Self-pay

## 2019-12-26 ENCOUNTER — Encounter: Payer: Self-pay | Admitting: Family Medicine

## 2019-12-26 ENCOUNTER — Ambulatory Visit: Payer: 59 | Admitting: Family Medicine

## 2019-12-26 ENCOUNTER — Other Ambulatory Visit: Payer: Self-pay

## 2019-12-26 DIAGNOSIS — F5104 Psychophysiologic insomnia: Secondary | ICD-10-CM | POA: Diagnosis not present

## 2019-12-26 MED ORDER — ZOLPIDEM TARTRATE 5 MG PO TABS: 5.0000 mg | ORAL_TABLET | Freq: Every evening | ORAL | 2 refills | Status: DC | PRN

## 2019-12-26 NOTE — Assessment & Plan Note (Signed)
Tolerating her medicine well. Will continue her Remus Loffler during the time of her court appearances and then will try to cut down to an as needed basis. Recheck 3 months. Call with any concerns.

## 2019-12-26 NOTE — Progress Notes (Signed)
BP (!) 136/94 (BP Location: Left Arm, Patient Position: Sitting, Cuff Size: Normal)   Pulse 91   Temp 98.7 F (37.1 C) (Oral)   Ht 5\' 5"  (1.651 m)   Wt 203 lb 9.6 oz (92.4 kg)   LMP 12/13/2019 (Within Days)   SpO2 100%   BMI 33.88 kg/m    Subjective:    Patient ID: Maria Little, female    DOB: Jan 08, 1976, 44 y.o.   MRN: [id number]  HPI: MAGDALINE ZOLLARS is a 44 y.o. female  Chief Complaint  Patient presents with  . Insomnia   INSOMNIA- going to have court in July and August. 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 Feeling much more like herself and much more positive.  Duration: weeks Satisfied with sleep quality: yes Difficulty falling asleep: yes Difficulty staying asleep: no Waking a few hours after sleep onset: no Early morning awakenings: no Daytime hypersomnolence: no Wakes feeling refreshed: no Good sleep hygiene: no Apnea: no Snoring: no Depressed/anxious mood: no Recent stress: no Restless legs/nocturnal leg cramps: no Chronic pain/arthritis: no History of sleep study: no Treatments attempted: melatonin, uinsom, benadryl and ambien   Relevant past medical, surgical, family and social history reviewed and updated as indicated. Interim medical history since our last visit reviewed. Allergies and medications reviewed and updated.  Review of Systems  Constitutional: Negative.   HENT: Negative.   Respiratory: Negative.   Cardiovascular: Negative.   Psychiatric/Behavioral: Positive for dysphoric mood. Negative for agitation, behavioral problems, confusion, decreased concentration, hallucinations, self-injury, sleep disturbance and suicidal ideas. The patient is nervous/anxious. The patient is not hyperactive.     Per HPI unless specifically indicated above     Objective:    BP (!) 136/94 (BP Location: Left Arm, Patient Position: Sitting, Cuff Size: Normal)   Pulse 91   Temp 98.7 F (37.1 C) (Oral)   Ht 5\' 5"  (1.651 m)   Wt 203 lb 9.6 oz  (92.4 kg)   LMP 12/13/2019 (Within Days)   SpO2 100%   BMI 33.88 kg/m   Wt Readings from Last 3 Encounters:  12/26/19 203 lb 9.6 oz (92.4 kg)  12/11/19 200 lb 6.4 oz (90.9 kg)  11/19/19 204 lb 6.4 oz (92.7 kg)    Physical Exam Vitals and nursing note reviewed.  Constitutional:      General: She is not in acute distress.    Appearance: Normal appearance. She is not ill-appearing, toxic-appearing or diaphoretic.  HENT:     Head: Normocephalic and atraumatic.     Right Ear: External ear normal.     Left Ear: External ear normal.     Nose: Nose normal.     Mouth/Throat:     Mouth: Mucous membranes are moist.     Pharynx: Oropharynx is clear.  Eyes:     General: No scleral icterus.       Right eye: No discharge.        Left eye: No discharge.     Extraocular Movements: Extraocular movements intact.     Conjunctiva/sclera: Conjunctivae normal.     Pupils: Pupils are equal, round, and reactive to light.  Cardiovascular:     Rate and Rhythm: Normal rate and regular rhythm.     Pulses: Normal pulses.     Heart sounds: Normal heart sounds. No murmur. No friction rub. No gallop.   Pulmonary:     Effort: Pulmonary effort is normal. No respiratory distress.     Breath sounds: Normal breath sounds. No  stridor. No wheezing, rhonchi or rales.  Chest:     Chest wall: No tenderness.  Musculoskeletal:        General: Normal range of motion.     Cervical back: Normal range of motion and neck supple.  Skin:    General: Skin is warm and dry.     Capillary Refill: Capillary refill takes less than 2 seconds.     Coloration: Skin is not jaundiced or pale.     Findings: No bruising, erythema, lesion or rash.  Neurological:     General: No focal deficit present.     Mental Status: She is alert and oriented to person, place, and time. Mental status is at baseline.  Psychiatric:        Mood and Affect: Mood normal.        Behavior: Behavior normal.        Thought Content: Thought content  normal.        Judgment: Judgment normal.     Results for orders placed or performed in visit on 12/11/19  CBC With Differential/Platelet  Result Value Ref Range   WBC 8.1 3.4 - 10.8 x10E3/uL   RBC 5.05 3.77 - 5.28 x10E6/uL   Hemoglobin 14.4 11.1 - 15.9 g/dL   Hematocrit 72.6 20.3 - 46.6 %   MCV 87 79 - 97 fL   MCH 28.5 26.6 - 33.0 pg   MCHC 33.0 31.5 - 35.7 g/dL   RDW 55.9 74.1 - 63.8 %   Platelets 345 150 - 450 x10E3/uL   Neutrophils 48 Not Estab. %   Lymphs 43 Not Estab. %   MID 9 Not Estab. %   Neutrophils Absolute 3.9 1.4 - 7.0 x10E3/uL   Lymphocytes Absolute 3.5 (H) 0.7 - 3.1 x10E3/uL   MID (Absolute) 0.7 0.1 - 1.6 X10E3/uL  Comprehensive metabolic panel  Result Value Ref Range   Glucose 173 (H) 65 - 99 mg/dL   BUN 7 6 - 24 mg/dL   Creatinine, Ser 4.53 0.57 - 1.00 mg/dL   GFR calc non Af Amer 89 >59 mL/min/1.73   GFR calc Af Amer 103 >59 mL/min/1.73   BUN/Creatinine Ratio 9 9 - 23   Sodium 140 134 - 144 mmol/L   Potassium 4.0 3.5 - 5.2 mmol/L   Chloride 106 96 - 106 mmol/L   CO2 20 20 - 29 mmol/L   Calcium 9.8 8.7 - 10.2 mg/dL   Total Protein 7.9 6.0 - 8.5 g/dL   Albumin 4.4 3.8 - 4.8 g/dL   Globulin, Total 3.5 1.5 - 4.5 g/dL   Albumin/Globulin Ratio 1.3 1.2 - 2.2   Bilirubin Total 0.5 0.0 - 1.2 mg/dL   Alkaline Phosphatase 83 48 - 121 IU/L   AST 20 0 - 40 IU/L   ALT 20 0 - 32 IU/L      Assessment & Little:   Problem List Items Addressed This Visit      Other   Psychophysiological insomnia    Tolerating her medicine well. Will continue her Remus Loffler during the time of her court appearances and then will try to cut down to an as needed basis. Recheck 3 months. Call with any concerns.           Follow up Little: Return in about 3 months (around 03/27/2020).

## 2019-12-27 ENCOUNTER — Encounter: Payer: Self-pay | Admitting: Family Medicine

## 2019-12-27 ENCOUNTER — Ambulatory Visit: Payer: 59 | Admitting: Family Medicine

## 2019-12-28 ENCOUNTER — Ambulatory Visit: Payer: Self-pay

## 2019-12-28 NOTE — Chronic Care Management (AMB) (Signed)
  Care Management   Follow Up Note   12/28/2019 Name: AASHKA SALOMONE MRN: [id number] DOB: Nov 16, 1975  Referred by: Dorcas Carrow, DO Reason for referral : Care Coordination   Beryl L Curfman is a 44 y.o. year old female who is a primary care patient of Dorcas Carrow, DO. The care management team was consulted for assistance with care management and care coordination needs.    Review of patient status, including review of consultants reports, relevant laboratory and other test results, and collaboration with appropriate care team members and the patient's provider was performed as part of comprehensive patient evaluation and provision of chronic care management services.    LCSW completed CCM outreach attempt today but was unable to reach patient successfully. A HIPPA compliant voice message was left encouraging patient to return call once available. LCSW rescheduled CCM SW appointment as well.  A HIPPA compliant phone message was left for the patient providing contact information and requesting a return call.   Dickie La, BSW, MSW, LCSW [facility] Care [facility] [place]  [facility] [email or web address] Phone: [phone]

## 2020-01-08 ENCOUNTER — Encounter: Payer: Self-pay | Admitting: Family Medicine

## 2020-01-10 ENCOUNTER — Ambulatory Visit: Payer: 59 | Admitting: Family Medicine

## 2020-01-18 ENCOUNTER — Ambulatory Visit: Payer: Self-pay | Admitting: Licensed Clinical Social Worker

## 2020-01-19 NOTE — Chronic Care Management (AMB) (Signed)
Visit Information  Goals Addressed    .  "I want to gain additional support from the community" (pt-stated)        Current Barriers:  . Chronic Mental Health needs related to depression and anxiety . Financial constraints related to managing health care expenses  . Limited social support . Mental Health Concerns  . Lacks knowledge of community resource: Mental Health Resources within her nearby area . Suicidal Ideation/Homicidal Ideation: No  Clinical Social Work Goal(s):   Over the next 120 days, patient will work with SW bi-monthly by telephone or in person to reduce or manage symptoms related to depression   Over the next 120 days, patient will demonstrate improved health management independence as evidenced by implementing healthy self-care and positive support/resources into her daily routine to cope with stressors and improve overall health and well-being   Interventions: . Patient interviewed and appropriate assessments performed: brief mental health assessment . Patient interviewed and appropriate assessments performed . Provided mental health counseling with regard to depression management   . Discussed plans with patient for ongoing care management follow up and provided patient with direct contact information for care management team . Advised patient to follow up with DSS regarding her current Medicaid application status. Patient successfully applied for Medicaid on 01/17/20. . Assisted patient/caregiver with obtaining information about health plan benefits . Provided education and assistance to client regarding Advanced Directives. . Provided education to patient/caregiver regarding level of care options. . Referred patient to [facility] (mental health provider) for long term follow up and therapy/counseling . LCSW discussed coping skills for depression and stress management. SW used empathetic and active and reflective listening, validated patient's  feelings/concerns, and provided emotional support. LCSW provided self-care education to help manage her multiple health conditions and improve her mood.  . Brief CBT provided throughout entire session  Patient Self Care Activities:  . Attends all scheduled provider appointments . Calls provider office for new concerns or questions . Ability for insight . Independent living . Motivation for treatment  Patient Coping Strengths:  . Hopefulness . Self Advocate . Able to Communicate Effectively  Patient Self Care Deficits:  . Lacks social connections  Initial goal documentation     Ms. Difatta was given information about Care Management services today including:  1. Care Management services include personalized support from designated clinical staff supervised by her physician, including individualized plan of care and coordination with other care providers 2. 24/7 contact phone numbers for assistance for urgent and routine care needs. 3. The patient may stop CCM services at any time (effective at the end of the month) by phone call to the office staff.  Patient agreed to services and verbal consent obtained.  The care management team will reach out to the patient again over the next quarter.  Dickie La, BSW, MSW, LCSW [facility] Care [facility] [place]  [facility] [email or web address] Phone: [phone]

## 2020-01-25 ENCOUNTER — Telehealth: Payer: Self-pay

## 2020-02-08 ENCOUNTER — Other Ambulatory Visit: Payer: Self-pay

## 2020-02-08 ENCOUNTER — Ambulatory Visit (INDEPENDENT_AMBULATORY_CARE_PROVIDER_SITE_OTHER): Payer: Medicaid Other

## 2020-02-08 DIAGNOSIS — Z3042 Encounter for surveillance of injectable contraceptive: Secondary | ICD-10-CM | POA: Diagnosis not present

## 2020-02-27 ENCOUNTER — Other Ambulatory Visit: Payer: Self-pay | Admitting: Family Medicine

## 2020-03-12 ENCOUNTER — Other Ambulatory Visit: Payer: Self-pay | Admitting: Family Medicine

## 2020-04-02 ENCOUNTER — Ambulatory Visit: Payer: 59 | Admitting: Family Medicine

## 2020-04-11 ENCOUNTER — Ambulatory Visit: Payer: Self-pay | Admitting: Licensed Clinical Social Worker

## 2020-04-11 NOTE — Chronic Care Management (AMB) (Signed)
Care Management   Follow Up Note   04/11/2020 Name: Maria Little MRN: [id number] DOB: 11-Sep-1975  Referred by: Dorcas Carrow, DO Reason for referral : Care Coordination   Maria Little is a 44 y.o. year old female who is a primary care patient of Dorcas Carrow, DO. The care management team was consulted for assistance with care management and care coordination needs.    Review of patient status, including review of consultants reports, relevant laboratory and other test results, and collaboration with appropriate care team members and the patient's provider was performed as part of comprehensive patient evaluation and provision of chronic care management services.    SDOH (Social Determinants of Health) assessments performed: Yes See Care Plan activities for detailed interventions related to [facility])     Advanced Directives: See Care Plan and Vynca application for related entries.   Goals Addressed    .  COMPLETED: "I want to gain additional support from the community" (pt-stated)        Current Barriers:  . Chronic Mental Health needs related to depression and anxiety . Financial constraints related to managing health care expenses  . Limited social support . Mental Health Concerns  . Lacks knowledge of community resource: Mental Health Resources within her nearby area . Suicidal Ideation/Homicidal Ideation: No  Clinical Social Work Goal(s):   Over the next 120 days, patient will work with SW bi-monthly by telephone or in person to reduce or manage symptoms related to depression   Over the next 120 days, patient will demonstrate improved health management independence as evidenced by implementing healthy self-care and positive support/resources into her daily routine to cope with stressors and improve overall health and well-being   Interventions: . Patient interviewed and appropriate assessments performed: brief mental health assessment . Patient interviewed and  appropriate assessments performed . Provided mental health counseling with regard to depression management   . Discussed plans with patient for ongoing care management follow up and provided patient with direct contact information for care management team . Advised patient to follow up with DSS regarding her current Medicaid application status. Patient successfully applied for Medicaid on 01/17/20. UPDATE - Patient self reported that she was approved for Medicaid during LCSW's call on 04/11/20.   . Assisted patient/caregiver with obtaining information about health plan benefits . Provided education and assistance to client regarding Advanced Directives. . Provided education to patient/caregiver regarding level of care options. . Referred patient to [facility] (mental health provider) for long term follow up and therapy/counseling. Patient denied needing mental health resource community education today during session as she reports that she is managing her mental health "better than ever."  . LCSW discussed coping skills for depression and stress management. SW used empathetic and active and reflective listening, validated patient's feelings/concerns, and provided emotional support. LCSW provided self-care education to help manage her multiple health conditions and improve her mood.  . Brief CBT provided throughout entire session . UPDATE- Patient shares that she is doing well and has no further social work needs or goals to work on at this time but will let CCM LCSW know if this changes. Patient prefers social work case closure at this time but can be re-referred to our services at any given time.   Patient Self Care Activities:  . Attends all scheduled provider appointments . Calls provider office for new concerns or questions . Ability for insight . Independent living . Motivation for treatment  Patient Coping Strengths:  .  Hopefulness . Self Advocate . Able to Communicate  Effectively  Patient Self Care Deficits:  . Lacks social connections  Please see past updates related to this goal by clicking on the "Past Updates" button in the selected goal       The patient has been provided with contact information for the care management team and has been advised to call with any health related questions or concerns.   Dickie La, BSW, MSW, LCSW [facility] Care Management [place]  [facility] [email or web address] Phone: [phone]

## 2020-04-30 ENCOUNTER — Other Ambulatory Visit: Payer: Self-pay

## 2020-04-30 ENCOUNTER — Ambulatory Visit (INDEPENDENT_AMBULATORY_CARE_PROVIDER_SITE_OTHER): Payer: Self-pay

## 2020-04-30 DIAGNOSIS — Z3042 Encounter for surveillance of injectable contraceptive: Secondary | ICD-10-CM

## 2020-05-13 ENCOUNTER — Encounter: Payer: Self-pay | Admitting: Nurse Practitioner

## 2020-05-13 DIAGNOSIS — R739 Hyperglycemia, unspecified: Secondary | ICD-10-CM

## 2020-05-15 ENCOUNTER — Other Ambulatory Visit: Payer: Self-pay | Admitting: Family Medicine

## 2020-06-10 ENCOUNTER — Encounter: Payer: Self-pay | Admitting: Family Medicine

## 2020-06-11 MED ORDER — OMEPRAZOLE 20 MG PO CPDR: 20.0000 mg | DELAYED_RELEASE_CAPSULE | Freq: Every day | ORAL | 3 refills | Status: DC

## 2020-07-05 ENCOUNTER — Encounter: Payer: Self-pay | Admitting: Family Medicine

## 2020-08-17 ENCOUNTER — Encounter: Payer: Self-pay | Admitting: Family Medicine

## 2020-08-20 ENCOUNTER — Ambulatory Visit (INDEPENDENT_AMBULATORY_CARE_PROVIDER_SITE_OTHER): Payer: BC Managed Care – PPO

## 2020-08-20 ENCOUNTER — Other Ambulatory Visit: Payer: Self-pay

## 2020-08-20 DIAGNOSIS — Z3042 Encounter for surveillance of injectable contraceptive: Secondary | ICD-10-CM | POA: Diagnosis not present

## 2020-08-20 LAB — PREGNANCY, URINE: Preg Test, Ur: NEGATIVE

## 2020-08-23 ENCOUNTER — Other Ambulatory Visit: Payer: Self-pay | Admitting: Family Medicine

## 2020-08-23 NOTE — Telephone Encounter (Signed)
Requested medication (s) are due for refill today: yes  Requested medication (s) are on the active medication list: yes  Last refill:  12/26/19 #30 2 refills  Future visit scheduled: yes in 1 week   Notes to clinic:  not delegated per protocol     Requested Prescriptions  Pending Prescriptions Disp Refills   zolpidem (AMBIEN) 5 MG tablet [Pharmacy Med Name: Zolpidem Tartrate 5 MG Oral Tablet] 30 tablet 0    Sig: TAKE 1 TABLET BY MOUTH AT BEDTIME AS NEEDED FOR SLEEP      Not Delegated - Psychiatry:  Anxiolytics/Hypnotics Failed - 08/23/2020 10:42 AM      Failed - This refill cannot be delegated      Failed - Urine Drug Screen completed in last 360 days      Failed - Valid encounter within last 6 months    Recent Outpatient Visits           8 months ago Psychophysiological insomnia   [facility] [place], Megan P, DO   8 months ago Fever, unspecified fever cause   [facility] Gabriel Cirri, NP   9 months ago Routine general medical examination at a health care facility   [facility] [place] P, DO   9 months ago Depression, major, recurrent, moderate (HCC)   [facility] [place], Megan P, DO   10 months ago Depression, major, recurrent, moderate (HCC)   [facility] [place], Diamond, DO       Future Appointments             In 1 week Laural Benes, Oralia Rud, DO [employer], [facility]

## 2020-08-25 NOTE — Telephone Encounter (Signed)
Appt on 09/03/20

## 2020-08-25 NOTE — Telephone Encounter (Signed)
Needs appt

## 2020-08-25 NOTE — Telephone Encounter (Signed)
Routing to provider  

## 2020-08-26 ENCOUNTER — Encounter: Payer: Self-pay | Admitting: Family Medicine

## 2020-09-03 ENCOUNTER — Other Ambulatory Visit: Payer: Self-pay

## 2020-09-03 ENCOUNTER — Encounter: Payer: Self-pay | Admitting: Family Medicine

## 2020-09-03 ENCOUNTER — Ambulatory Visit (INDEPENDENT_AMBULATORY_CARE_PROVIDER_SITE_OTHER): Payer: BC Managed Care – PPO | Admitting: Family Medicine

## 2020-09-03 VITALS — BP 133/83 | HR 88 | Temp 99.9°F | Wt 199.4 lb

## 2020-09-03 DIAGNOSIS — F5104 Psychophysiologic insomnia: Secondary | ICD-10-CM

## 2020-09-03 DIAGNOSIS — R5382 Chronic fatigue, unspecified: Secondary | ICD-10-CM

## 2020-09-03 DIAGNOSIS — K219 Gastro-esophageal reflux disease without esophagitis: Secondary | ICD-10-CM

## 2020-09-03 DIAGNOSIS — E1165 Type 2 diabetes mellitus with hyperglycemia: Secondary | ICD-10-CM | POA: Diagnosis not present

## 2020-09-03 DIAGNOSIS — R739 Hyperglycemia, unspecified: Secondary | ICD-10-CM | POA: Diagnosis not present

## 2020-09-03 DIAGNOSIS — Z13220 Encounter for screening for lipoid disorders: Secondary | ICD-10-CM

## 2020-09-03 DIAGNOSIS — F331 Major depressive disorder, recurrent, moderate: Secondary | ICD-10-CM | POA: Diagnosis not present

## 2020-09-03 DIAGNOSIS — E1129 Type 2 diabetes mellitus with other diabetic kidney complication: Secondary | ICD-10-CM | POA: Insufficient documentation

## 2020-09-03 LAB — BAYER DCA HB A1C WAIVED: HB A1C (BAYER DCA - WAIVED): 8 % — ABNORMAL HIGH (ref <7.0)

## 2020-09-03 MED ORDER — METFORMIN HCL ER 500 MG PO TB24: 500.0000 mg | ORAL_TABLET | Freq: Two times a day (BID) | ORAL | 2 refills | Status: DC

## 2020-09-03 MED ORDER — SERTRALINE HCL 100 MG PO TABS: 100.0000 mg | ORAL_TABLET | Freq: Every day | ORAL | 1 refills | Status: DC

## 2020-09-03 MED ORDER — ZOLPIDEM TARTRATE 5 MG PO TABS: 5.0000 mg | ORAL_TABLET | Freq: Every evening | ORAL | 2 refills | Status: DC | PRN

## 2020-09-03 NOTE — Assessment & Plan Note (Signed)
Under good control on current regimen. Continue current regimen. Continue to monitor. Call with any concerns. Refills given.   

## 2020-09-03 NOTE — Progress Notes (Signed)
BP 133/83   Pulse 88   Temp 99.9 F (37.7 C) (Oral)   Wt 199 lb 6.4 oz (90.4 kg)   SpO2 99%   BMI 33.18 kg/m    Subjective:    Patient ID: Maria Little, female    DOB: October 29, 1975, 45 y.o.   MRN: [id number]  HPI: Maria Little is a 45 y.o. female  Chief Complaint  Patient presents with  . Insomnia    Pt states she is here to discuss her refill for Ambien and to discuss next recommend steps of insomnia.   INSOMNIA -has been out of the medicine for about a week, had been taking it about 1x a week Duration: months Satisfied with sleep quality: no Difficulty falling asleep: yes Difficulty staying asleep: yes Waking a few hours after sleep onset: yes Early morning awakenings: yes Daytime hypersomnolence: yes Wakes feeling refreshed: no Good sleep hygiene: no Apnea: no Snoring: no Depressed/anxious mood: yes Recent stress: yes Restless legs/nocturnal leg cramps: no Chronic pain/arthritis: no History of sleep study: no Treatments attempted: trazodone, melatonin, uinsom, benadryl and ambien   DEPRESSION- has not been taking her medicine every day, has a lot of stressors Mood status: back and forth Satisfied with current treatment?: no Symptom severity: moderate  Duration of current treatment : chronic Side effects: no Medication compliance: poor compliance Psychotherapy/counseling: no  Previous psychiatric medications:  Depressed mood: yes Anxious mood: yes Anhedonia: no Significant weight loss or gain: no Insomnia: yes  Fatigue: yes Feelings of worthlessness or guilt: no Impaired concentration/indecisiveness: no Suicidal ideations: no Hopelessness: no Crying spells: no Depression screen [facility] 2/9 09/03/2020 11/19/2019 10/31/2019 10/03/2019 01/15/2019  Decreased Interest 0 0 0 0 0  Down, Depressed, Hopeless 1 0 2 1 0  PHQ - 2 Score 1 0 2 1 0  Altered sleeping 3 3 3 3 3   Tired, decreased energy 0 0 0 0 0  Change in appetite 1 0 1 0 2  Feeling bad or failure  about yourself  3 3 2  0 0  Trouble concentrating 0 0 0 0 0  Moving slowly or fidgety/restless 0 0 0 0 0  Suicidal thoughts 0 0 0 0 0  PHQ-9 Score 8 6 8 4 5   Difficult doing work/chores Somewhat difficult Somewhat difficult Somewhat difficult Not difficult at all Not difficult at all  Some recent data might be hidden   DIABETES Hypoglycemic episodes:no Polydipsia/polyuria: yes Visual disturbance: no Chest pain: no Paresthesias: yes Glucose Monitoring: yes  Accucheck frequency: occasionally  Fasting glucose:120-225 Taking Insulin?: no Blood Pressure Monitoring: not checking Retinal Examination: Not up to Date Foot Exam: Done todya Diabetic Education: Not Completed Pneumovax: Not up to Date Influenza: Up to Date Aspirin: no   Relevant past medical, surgical, family and social history reviewed and updated as indicated. Interim medical history since our last visit reviewed. Allergies and medications reviewed and updated.  Review of Systems  Constitutional: Negative.   HENT: Negative.   Respiratory: Negative.   Cardiovascular: Negative.   Gastrointestinal: Negative.   Musculoskeletal: Negative.   Neurological: Positive for numbness. Negative for dizziness, tremors, seizures, syncope, facial asymmetry, speech difficulty, weakness, light-headedness and headaches.  Psychiatric/Behavioral: Positive for dysphoric mood and sleep disturbance. Negative for agitation, behavioral problems, confusion, decreased concentration, hallucinations, self-injury and suicidal ideas. The patient is nervous/anxious. The patient is not hyperactive.     Per HPI unless specifically indicated above     Objective:    BP 133/83   Pulse 88  Temp 99.9 F (37.7 C) (Oral)   Wt 199 lb 6.4 oz (90.4 kg)   SpO2 99%   BMI 33.18 kg/m   Wt Readings from Last 3 Encounters:  09/03/20 199 lb 6.4 oz (90.4 kg)  12/26/19 203 lb 9.6 oz (92.4 kg)  12/11/19 200 lb 6.4 oz (90.9 kg)    Physical Exam Vitals and  nursing note reviewed.  Constitutional:      General: She is not in acute distress.    Appearance: Normal appearance. She is not ill-appearing, toxic-appearing or diaphoretic.  HENT:     Head: Normocephalic and atraumatic.     Right Ear: External ear normal.     Left Ear: External ear normal.     Nose: Nose normal.     Mouth/Throat:     Mouth: Mucous membranes are moist.     Pharynx: Oropharynx is clear.  Eyes:     General: No scleral icterus.       Right eye: No discharge.        Left eye: No discharge.     Extraocular Movements: Extraocular movements intact.     Conjunctiva/sclera: Conjunctivae normal.     Pupils: Pupils are equal, round, and reactive to light.  Cardiovascular:     Rate and Rhythm: Normal rate and regular rhythm.     Pulses: Normal pulses.     Heart sounds: Normal heart sounds. No murmur heard. No friction rub. No gallop.   Pulmonary:     Effort: Pulmonary effort is normal. No respiratory distress.     Breath sounds: Normal breath sounds. No stridor. No wheezing, rhonchi or rales.  Chest:     Chest wall: No tenderness.  Musculoskeletal:        General: Normal range of motion.     Cervical back: Normal range of motion and neck supple.  Skin:    General: Skin is warm and dry.     Capillary Refill: Capillary refill takes less than 2 seconds.     Coloration: Skin is not jaundiced or pale.     Findings: No bruising, erythema, lesion or rash.  Neurological:     General: No focal deficit present.     Mental Status: She is alert and oriented to person, place, and time. Mental status is at baseline.  Psychiatric:        Mood and Affect: Mood normal.        Behavior: Behavior normal.        Thought Content: Thought content normal.        Judgment: Judgment normal.     Results for orders placed or performed in visit on 08/20/20  Pregnancy, urine  Result Value Ref Range   Preg Test, Ur Negative Negative      Assessment & Plan:   Problem List Items  Addressed This Visit      Digestive   Gastroesophageal reflux disease    Under good control on current regimen. Continue current regimen. Continue to monitor. Call with any concerns. Refills given.          Endocrine   Type 2 diabetes mellitus with hyperglycemia, without long-term current use of insulin (HCC) - Primary    Newly diagnosed today with a1c of 8.0. We will start metformin. She will consider referral to nutrition. Call with any concerns. Recheck 4-6 weeks.       Relevant Medications   metFORMIN (GLUCOPHAGE-XR) 500 MG 24 hr tablet     Other   Depression, major, recurrent,  moderate (HCC)    Not under good control. Has not been taking her medicine daily. Stressed the importance of taking her medicine daily. Refills given today. Call with any concerns.       Relevant Medications   sertraline (ZOLOFT) 100 MG tablet   Psychophysiological insomnia    Not under good control. Has not been taking her medicine. Has a lot of social stressors. Encouraged her to take her medicine daily while waiting for sertraline to kick in. Call with any concerns. Continue to monitor.       Other Visit Diagnoses    Hyperglycemia       Checking labs today. Await results. Treat as  needed.    Relevant Orders   Bayer DCA Hb A1c Waived   Comprehensive metabolic panel   Chronic fatigue       Checking labs today. Await results. Treat as  needed.    Relevant Orders   CBC with Differential/Platelet   Screening for cholesterol level       Checking labs today. Await results. Treat as  needed.    Relevant Orders   Lipid Panel w/o Chol/HDL Ratio       Follow up plan: Return in about 6 weeks (around 10/15/2020).

## 2020-09-03 NOTE — Assessment & Plan Note (Signed)
Not under good control. Has not been taking her medicine. Has a lot of social stressors. Encouraged her to take her medicine daily while waiting for sertraline to kick in. Call with any concerns. Continue to monitor.

## 2020-09-03 NOTE — Assessment & Plan Note (Signed)
Newly diagnosed today with a1c of 8.0. We will start metformin. She will consider referral to nutrition. Call with any concerns. Recheck 4-6 weeks.

## 2020-09-03 NOTE — Assessment & Plan Note (Signed)
Not under good control. Has not been taking her medicine daily. Stressed the importance of taking her medicine daily. Refills given today. Call with any concerns.

## 2020-09-04 ENCOUNTER — Encounter: Payer: Self-pay | Admitting: Family Medicine

## 2020-09-04 LAB — CBC WITH DIFFERENTIAL/PLATELET
Basophils Absolute: 0 10*3/uL (ref 0.0–0.2)
Basos: 0 %
EOS (ABSOLUTE): 0.1 10*3/uL (ref 0.0–0.4)
Eos: 2 %
Hematocrit: 39.4 % (ref 34.0–46.6)
Hemoglobin: 12.6 g/dL (ref 11.1–15.9)
Immature Grans (Abs): 0 10*3/uL (ref 0.0–0.1)
Immature Granulocytes: 0 %
Lymphocytes Absolute: 2.6 10*3/uL (ref 0.7–3.1)
Lymphs: 36 %
MCH: 27.4 pg (ref 26.6–33.0)
MCHC: 32 g/dL (ref 31.5–35.7)
MCV: 86 fL (ref 79–97)
Monocytes Absolute: 0.5 10*3/uL (ref 0.1–0.9)
Monocytes: 7 %
Neutrophils Absolute: 3.8 10*3/uL (ref 1.4–7.0)
Neutrophils: 55 %
Platelets: 317 10*3/uL (ref 150–450)
RBC: 4.6 x10E6/uL (ref 3.77–5.28)
RDW: 13.3 % (ref 11.7–15.4)
WBC: 7.1 10*3/uL (ref 3.4–10.8)

## 2020-09-04 LAB — COMPREHENSIVE METABOLIC PANEL
ALT: 17 IU/L (ref 0–32)
AST: 20 IU/L (ref 0–40)
Albumin/Globulin Ratio: 1.3 (ref 1.2–2.2)
Albumin: 4 g/dL (ref 3.8–4.8)
Alkaline Phosphatase: 96 IU/L (ref 44–121)
BUN/Creatinine Ratio: 7 — ABNORMAL LOW (ref 9–23)
BUN: 5 mg/dL — ABNORMAL LOW (ref 6–24)
Bilirubin Total: 0.3 mg/dL (ref 0.0–1.2)
CO2: 21 mmol/L (ref 20–29)
Calcium: 8.8 mg/dL (ref 8.7–10.2)
Chloride: 103 mmol/L (ref 96–106)
Creatinine, Ser: 0.69 mg/dL (ref 0.57–1.00)
GFR calc Af Amer: 123 mL/min/{1.73_m2} (ref >59)
GFR calc non Af Amer: 107 mL/min/{1.73_m2} (ref >59)
Globulin, Total: 3.2 g/dL (ref 1.5–4.5)
Glucose: 197 mg/dL — ABNORMAL HIGH (ref 65–99)
Potassium: 3.4 mmol/L — ABNORMAL LOW (ref 3.5–5.2)
Sodium: 139 mmol/L (ref 134–144)
Total Protein: 7.2 g/dL (ref 6.0–8.5)

## 2020-09-04 LAB — LIPID PANEL W/O CHOL/HDL RATIO
Cholesterol, Total: 253 mg/dL — ABNORMAL HIGH (ref 100–199)
HDL: 48 mg/dL (ref >39)
LDL Chol Calc (NIH): 187 mg/dL — ABNORMAL HIGH (ref 0–99)
Triglycerides: 103 mg/dL (ref 0–149)
VLDL Cholesterol Cal: 18 mg/dL (ref 5–40)

## 2020-09-22 ENCOUNTER — Other Ambulatory Visit: Payer: Self-pay | Admitting: Family Medicine

## 2020-09-22 DIAGNOSIS — E1165 Type 2 diabetes mellitus with hyperglycemia: Secondary | ICD-10-CM

## 2020-10-15 ENCOUNTER — Encounter: Payer: Self-pay | Admitting: Family Medicine

## 2020-10-15 ENCOUNTER — Ambulatory Visit (INDEPENDENT_AMBULATORY_CARE_PROVIDER_SITE_OTHER): Payer: BC Managed Care – PPO | Admitting: Family Medicine

## 2020-10-15 ENCOUNTER — Other Ambulatory Visit: Payer: Self-pay

## 2020-10-15 VITALS — BP 123/78 | HR 93 | Temp 98.6°F | Wt 196.8 lb

## 2020-10-15 DIAGNOSIS — E1165 Type 2 diabetes mellitus with hyperglycemia: Secondary | ICD-10-CM | POA: Diagnosis not present

## 2020-10-15 DIAGNOSIS — F331 Major depressive disorder, recurrent, moderate: Secondary | ICD-10-CM | POA: Diagnosis not present

## 2020-10-15 DIAGNOSIS — Z23 Encounter for immunization: Secondary | ICD-10-CM

## 2020-10-15 LAB — MICROALBUMIN, URINE WAIVED
Creatinine, Urine Waived: 200 mg/dL (ref 10–300)
Microalb, Ur Waived: 30 mg/L — ABNORMAL HIGH (ref 0–19)
Microalb/Creat Ratio: <30 mg/g (ref <30)

## 2020-10-15 NOTE — Assessment & Plan Note (Signed)
Encouraged patient to take 2 of her metformin. Checking labs today. Due for A1c in 6 weeks. Call with any concerns.

## 2020-10-15 NOTE — Assessment & Plan Note (Signed)
Under good control on current regimen. Continue current regimen. Continue to monitor. Call with any concerns. Refills up to date. Call with any concerns.    

## 2020-10-15 NOTE — Patient Instructions (Signed)
Pneumococcal Polysaccharide Vaccine (PPSV23): What You Need to Know 1. Why get vaccinated? Pneumococcal polysaccharide vaccine (PPSV23) can prevent pneumococcal disease. Pneumococcal disease refers to any illness caused by pneumococcal bacteria. These bacteria can cause many types of illnesses, including pneumonia, which is an infection of the lungs. Pneumococcal bacteria are one of the most common causes of pneumonia. Besides pneumonia, pneumococcal bacteria can also cause:  Ear infections  Sinus infections  Meningitis (infection of the tissue covering the brain and spinal cord)  Bacteremia (bloodstream infection) Anyone can get pneumococcal disease, but children under 2 years of age, people with certain medical conditions, adults 65 years or older, and cigarette smokers are at the highest risk. Most pneumococcal infections are mild. However, some can result in long-term problems, such as brain damage or hearing loss. Meningitis, bacteremia, and pneumonia caused by pneumococcal disease can be fatal. 2. PPSV23 PPSV23 protects against 23 types of bacteria that cause pneumococcal disease. PPSV23 is recommended for:  All adults 65 years or older,  Anyone 2 years or older with certain medical conditions that can lead to an increased risk for pneumococcal disease. Most people need only one dose of PPSV23. A second dose of PPSV23, and another type of pneumococcal vaccine called PCV13, are recommended for certain high-risk groups. Your health care provider can give you more information. People 65 years or older should get a dose of PPSV23 even if they have already gotten one or more doses of the vaccine before they turned 65. 3. Talk with your health care provider Tell your vaccine provider if the person getting the vaccine:  Has had an allergic reaction after a previous dose of PPSV23, or has any severe, life-threatening allergies. In some cases, your health care provider may decide to postpone  PPSV23 vaccination to a future visit. People with minor illnesses, such as a cold, may be vaccinated. People who are moderately or severely ill should usually wait until they recover before getting PPSV23. Your health care provider can give you more information. 4. Risks of a vaccine reaction  Redness or pain where the shot is given, feeling tired, fever, or muscle aches can happen after PPSV23. People sometimes faint after medical procedures, including vaccination. Tell your provider if you feel dizzy or have vision changes or ringing in the ears. As with any medicine, there is a very remote chance of a vaccine causing a severe allergic reaction, other serious injury, or death. 5. What if there is a serious problem? An allergic reaction could occur after the vaccinated person leaves the clinic. If you see signs of a severe allergic reaction (hives, swelling of the face and throat, difficulty breathing, a fast heartbeat, dizziness, or weakness), call 9-1-1 and get the person to the nearest hospital. For other signs that concern you, call your health care provider. Adverse reactions should be reported to the Vaccine Adverse Event Reporting System (VAERS). Your health care provider will usually file this report, or you can do it yourself. Visit the VAERS website at www.vaers.hhs.gov or call 1-800-822-7967. VAERS is only for reporting reactions, and VAERS staff do not give medical advice. 6. How can I learn more?  Ask your health care provider.  Call your local or state health department.  Contact the Centers for Disease Control and Prevention (CDC): ? Call 1-800-232-4636 (1-800-CDC-INFO) or ? Visit CDC's website at www.cdc.gov/vaccines Vaccine Information Statement PPSV23 Vaccine (05/17/2018) This information is not intended to replace advice given to you by your health care provider. Make sure you discuss   any questions you have with your health care provider. Document Revised: 03/07/2020  Document Reviewed: 03/07/2020 Elsevier Patient Education  2021 [employer]   

## 2020-10-15 NOTE — Progress Notes (Signed)
BP 123/78   Pulse 93   Temp 98.6 F (37 C) (Oral)   Wt 196 lb 12.8 oz (89.3 kg)   SpO2 99%   BMI 32.75 kg/m    Subjective:    Patient ID: Maria Little, female    DOB: 1976-01-17, 45 y.o.   MRN: [id number]  HPI: Maria Little is a 45 y.o. female  Chief Complaint  Patient presents with  . Diabetes    4 to 6 week f/up- states she has been getting good readings at home for her blood sugar  . Depression    Pt states he has had some "changes" with her depression   DIABETES- has only been taking her metformin 1x a day, due to diarrhea Hypoglycemic episodes:no Polydipsia/polyuria: no Visual disturbance: no Chest pain: no Paresthesias: no Glucose Monitoring: yes  Accucheck frequency: Daily  Fasting glucose: 140-160 Taking Insulin?: no Blood Pressure Monitoring: not checking Retinal Examination: Not up to Date Foot Exam: Up to Date Diabetic Education: Completed Pneumovax: Given today Influenza: Up to Date Aspirin: no  DEPRESSION- got her sentencing and has to serve 7 days in prison. She is feeling a little better.  Mood status: stable Satisfied with current treatment?: yes Symptom severity: moderate  Duration of current treatment : chronic Side effects: no Medication compliance: good compliance Psychotherapy/counseling: no  Previous psychiatric medications: sertraline Depressed mood: yes Anxious mood: yes Anhedonia: no Significant weight loss or gain: no Insomnia: yes hard to fall asleep Fatigue: yes Feelings of worthlessness or guilt: no Impaired concentration/indecisiveness: no Suicidal ideations: no Hopelessness: no Crying spells: no Depression screen [facility] 2/9 10/15/2020 09/03/2020 11/19/2019 10/31/2019 10/03/2019  Decreased Interest 0 0 0 0 0  Down, Depressed, Hopeless 1 1 0 2 1  PHQ - 2 Score 1 1 0 2 1  Altered sleeping 3 3 3 3 3   Tired, decreased energy 0 0 0 0 0  Change in appetite 3 1 0 1 0  Feeling bad or failure about yourself  0 3 3 2  0   Trouble concentrating 1 0 0 0 0  Moving slowly or fidgety/restless 0 0 0 0 0  Suicidal thoughts 1 0 0 0 0  PHQ-9 Score 9 8 6 8 4   Difficult doing work/chores Somewhat difficult Somewhat difficult Somewhat difficult Somewhat difficult Not difficult at all  Some recent data might be hidden     Relevant past medical, surgical, family and social history reviewed and updated as indicated. Interim medical history since our last visit reviewed. Allergies and medications reviewed and updated.  Review of Systems  Constitutional: Negative.   Respiratory: Negative.   Cardiovascular: Negative.   Gastrointestinal: Negative.   Musculoskeletal: Negative.   Psychiatric/Behavioral: Positive for dysphoric mood and sleep disturbance. Negative for agitation, behavioral problems, confusion, decreased concentration, hallucinations, self-injury and suicidal ideas. The patient is nervous/anxious. The patient is not hyperactive.     Per HPI unless specifically indicated above     Objective:    BP 123/78   Pulse 93   Temp 98.6 F (37 C) (Oral)   Wt 196 lb 12.8 oz (89.3 kg)   SpO2 99%   BMI 32.75 kg/m   Wt Readings from Last 3 Encounters:  10/15/20 196 lb 12.8 oz (89.3 kg)  09/03/20 199 lb 6.4 oz (90.4 kg)  12/26/19 203 lb 9.6 oz (92.4 kg)    Physical Exam Vitals and nursing note reviewed.  Constitutional:      General: She is not in acute distress.  Appearance: Normal appearance. She is not ill-appearing, toxic-appearing or diaphoretic.  HENT:     Head: Normocephalic and atraumatic.     Right Ear: External ear normal.     Left Ear: External ear normal.     Nose: Nose normal.     Mouth/Throat:     Mouth: Mucous membranes are moist.     Pharynx: Oropharynx is clear.  Eyes:     General: No scleral icterus.       Right eye: No discharge.        Left eye: No discharge.     Extraocular Movements: Extraocular movements intact.     Conjunctiva/sclera: Conjunctivae normal.     Pupils:  Pupils are equal, round, and reactive to light.  Cardiovascular:     Rate and Rhythm: Normal rate and regular rhythm.     Pulses: Normal pulses.     Heart sounds: Normal heart sounds. No murmur heard. No friction rub. No gallop.   Pulmonary:     Effort: Pulmonary effort is normal. No respiratory distress.     Breath sounds: Normal breath sounds. No stridor. No wheezing, rhonchi or rales.  Chest:     Chest wall: No tenderness.  Musculoskeletal:        General: Normal range of motion.     Cervical back: Normal range of motion and neck supple.  Skin:    General: Skin is warm and dry.     Capillary Refill: Capillary refill takes less than 2 seconds.     Coloration: Skin is not jaundiced or pale.     Findings: No bruising, erythema, lesion or rash.  Neurological:     General: No focal deficit present.     Mental Status: She is alert and oriented to person, place, and time. Mental status is at baseline.  Psychiatric:        Mood and Affect: Mood normal.        Behavior: Behavior normal.        Thought Content: Thought content normal.        Judgment: Judgment normal.     Results for orders placed or performed in visit on 09/03/20  Bayer DCA Hb A1c Waived  Result Value Ref Range   HB A1C (BAYER DCA - WAIVED) 8.0 (H) <7.0 %  CBC with Differential/Platelet  Result Value Ref Range   WBC 7.1 3.4 - 10.8 x10E3/uL   RBC 4.60 3.77 - 5.28 x10E6/uL   Hemoglobin 12.6 11.1 - 15.9 g/dL   Hematocrit 82.5 05.3 - 46.6 %   MCV 86 79 - 97 fL   MCH 27.4 26.6 - 33.0 pg   MCHC 32.0 31.5 - 35.7 g/dL   RDW 97.6 73.4 - 19.3 %   Platelets 317 150 - 450 x10E3/uL   Neutrophils 55 Not Estab. %   Lymphs 36 Not Estab. %   Monocytes 7 Not Estab. %   Eos 2 Not Estab. %   Basos 0 Not Estab. %   Neutrophils Absolute 3.8 1.4 - 7.0 x10E3/uL   Lymphocytes Absolute 2.6 0.7 - 3.1 x10E3/uL   Monocytes Absolute 0.5 0.1 - 0.9 x10E3/uL   EOS (ABSOLUTE) 0.1 0.0 - 0.4 x10E3/uL   Basophils Absolute 0.0 0.0 - 0.2  x10E3/uL   Immature Granulocytes 0 Not Estab. %   Immature Grans (Abs) 0.0 0.0 - 0.1 x10E3/uL  Comprehensive metabolic panel  Result Value Ref Range   Glucose 197 (H) 65 - 99 mg/dL   BUN 5 (L) 6 -  24 mg/dL   Creatinine, Ser 1.61 0.57 - 1.00 mg/dL   GFR calc non Af Amer 107 >59 mL/min/1.73   GFR calc Af Amer 123 >59 mL/min/1.73   BUN/Creatinine Ratio 7 (L) 9 - 23   Sodium 139 134 - 144 mmol/L   Potassium 3.4 (L) 3.5 - 5.2 mmol/L   Chloride 103 96 - 106 mmol/L   CO2 21 20 - 29 mmol/L   Calcium 8.8 8.7 - 10.2 mg/dL   Total Protein 7.2 6.0 - 8.5 g/dL   Albumin 4.0 3.8 - 4.8 g/dL   Globulin, Total 3.2 1.5 - 4.5 g/dL   Albumin/Globulin Ratio 1.3 1.2 - 2.2   Bilirubin Total 0.3 0.0 - 1.2 mg/dL   Alkaline Phosphatase 96 44 - 121 IU/L   AST 20 0 - 40 IU/L   ALT 17 0 - 32 IU/L  Lipid Panel w/o Chol/HDL Ratio  Result Value Ref Range   Cholesterol, Total 253 (H) 100 - 199 mg/dL   Triglycerides 096 0 - 149 mg/dL   HDL 48 >04 mg/dL   VLDL Cholesterol Cal 18 5 - 40 mg/dL   LDL Chol Calc (NIH) 540 (H) 0 - 99 mg/dL      Assessment & Plan:   Problem List Items Addressed This Visit      Endocrine   Type 2 diabetes mellitus with hyperglycemia, without long-term current use of insulin (HCC) - Primary    Encouraged patient to take 2 of her metformin. Checking labs today. Due for A1c in 6 weeks. Call with any concerns.       Relevant Orders   Comprehensive metabolic panel   Microalbumin, Urine Waived     Other   Depression, major, recurrent, moderate (HCC)    Under good control on current regimen. Continue current regimen. Continue to monitor. Call with any concerns. Refills up to date. Call with any concerns.            Follow up plan: Return in about 6 weeks (around 11/26/2020).

## 2020-10-16 LAB — COMPREHENSIVE METABOLIC PANEL
ALT: 14 IU/L (ref 0–32)
AST: 15 IU/L (ref 0–40)
Albumin/Globulin Ratio: 1.3 (ref 1.2–2.2)
Albumin: 4.3 g/dL (ref 3.8–4.8)
Alkaline Phosphatase: 109 IU/L (ref 44–121)
BUN/Creatinine Ratio: 6 — ABNORMAL LOW (ref 9–23)
BUN: 4 mg/dL — ABNORMAL LOW (ref 6–24)
Bilirubin Total: 0.4 mg/dL (ref 0.0–1.2)
CO2: 21 mmol/L (ref 20–29)
Calcium: 9.5 mg/dL (ref 8.7–10.2)
Chloride: 102 mmol/L (ref 96–106)
Creatinine, Ser: 0.67 mg/dL (ref 0.57–1.00)
Globulin, Total: 3.4 g/dL (ref 1.5–4.5)
Glucose: 155 mg/dL — ABNORMAL HIGH (ref 65–99)
Potassium: 4.2 mmol/L (ref 3.5–5.2)
Sodium: 140 mmol/L (ref 134–144)
Total Protein: 7.7 g/dL (ref 6.0–8.5)
eGFR: 111 mL/min/{1.73_m2} (ref >59)

## 2020-10-31 ENCOUNTER — Other Ambulatory Visit: Payer: Self-pay

## 2020-10-31 ENCOUNTER — Emergency Department: Payer: BC Managed Care – PPO

## 2020-10-31 ENCOUNTER — Emergency Department
Admission: EM | Admit: 2020-10-31 | Discharge: 2020-10-31 | Disposition: A | Discharge status: Home / Self Care | Payer: BC Managed Care – PPO | Attending: Emergency Medicine | Admitting: Emergency Medicine

## 2020-10-31 ENCOUNTER — Encounter: Payer: Self-pay | Admitting: Emergency Medicine

## 2020-10-31 DIAGNOSIS — R0789 Other chest pain: Secondary | ICD-10-CM

## 2020-10-31 DIAGNOSIS — R072 Precordial pain: Secondary | ICD-10-CM | POA: Diagnosis present

## 2020-10-31 DIAGNOSIS — Z7984 Long term (current) use of oral hypoglycemic drugs: Secondary | ICD-10-CM | POA: Insufficient documentation

## 2020-10-31 DIAGNOSIS — E119 Type 2 diabetes mellitus without complications: Secondary | ICD-10-CM | POA: Diagnosis not present

## 2020-10-31 HISTORY — DX: Type 2 diabetes mellitus without complications: E11.9

## 2020-10-31 LAB — BASIC METABOLIC PANEL
Anion gap: 8 (ref 5–15)
BUN: <5 mg/dL — ABNORMAL LOW (ref 6–20)
CO2: 23 mmol/L (ref 22–32)
Calcium: 8.6 mg/dL — ABNORMAL LOW (ref 8.9–10.3)
Chloride: 106 mmol/L (ref 98–111)
Creatinine, Ser: 0.65 mg/dL (ref 0.44–1.00)
GFR, Estimated: >60 mL/min (ref >60)
Glucose, Bld: 170 mg/dL — ABNORMAL HIGH (ref 70–99)
Potassium: 3.6 mmol/L (ref 3.5–5.1)
Sodium: 137 mmol/L (ref 135–145)

## 2020-10-31 LAB — CBC
HCT: 36.1 % (ref 36.0–46.0)
Hemoglobin: 11.7 g/dL — ABNORMAL LOW (ref 12.0–15.0)
MCH: 26.8 pg (ref 26.0–34.0)
MCHC: 32.4 g/dL (ref 30.0–36.0)
MCV: 82.8 fL (ref 80.0–100.0)
Platelets: 304 K/uL (ref 150–400)
RBC: 4.36 MIL/uL (ref 3.87–5.11)
RDW: 13.5 % (ref 11.5–15.5)
WBC: 7.6 K/uL (ref 4.0–10.5)
nRBC: 0 % (ref 0.0–0.2)

## 2020-10-31 LAB — TROPONIN I (HIGH SENSITIVITY): Troponin I (High Sensitivity): 3 ng/L

## 2020-10-31 MED ORDER — NAPROXEN 500 MG PO TABS: 500.0000 mg | ORAL_TABLET | Freq: Two times a day (BID) | ORAL | 0 refills | Status: AC

## 2020-10-31 NOTE — ED Notes (Addendum)
Pt has been provided with discharge instructions. Pt denies any questions or concerns at this time. Pt verbalizes understanding for follow up care and d/c.  Signature pad not working. Pt consents to receiving d/c instructions.  VSS.  Pt left department with all belongings.

## 2020-10-31 NOTE — ED Triage Notes (Addendum)
Pt comes into the ED via POV c/o right side chest pain that radiates into the back.  Pt states it has been ongoing x 1 week but today it got significantly worse.  Pt denies any SHOB, N/V, or dizziness. Pt speaking in full sentences with even and unlabored respirations.  Pt denies any known cardiac history. Pt states the pain is sharp with inhalation.  Denies any h/o blood clots.

## 2020-10-31 NOTE — ED Provider Notes (Signed)
[facility] Emergency Department Provider Note  ____________________________________________   Event Date/Time   First MD Initiated Contact with Patient 10/31/20 1356     (approximate)  I have reviewed the triage vital signs and the nursing notes.   HISTORY  Chief Complaint Chest Pain    HPI Maria Little is a 45 y.o. female  With PMHx DM, HLD, here with chest pain, SOB. Pt reports that over the past week, she has had progressively worsening, constant, sharp substernal chest pain. 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 She was bending down into a box when she felt acute chest pain, which has persisted since then. It is worse w/ movement, palpation. No alleviating factors. Occasionally, it radiates around L chest to her back. No cough, SOB. No leg swelling. No other complaints. No other injuries.        Past Medical History:  Diagnosis Date  . Diabetes mellitus without complication (HCC)   . Insomnia   . Migraine     Patient Active Problem List   Diagnosis Date Noted  . Type 2 diabetes mellitus with hyperglycemia, without long-term current use of insulin (HCC) 09/03/2020  . Psychophysiological insomnia 11/20/2019  . Hyperlipidemia 10/11/2016  . Depression, major, recurrent, moderate (HCC) 10/05/2016  . Gastroesophageal reflux disease 10/05/2016    Past Surgical History:  Procedure Laterality Date  . BREAST SURGERY     Lumpectomy    Prior to Admission medications   Medication Sig Start Date End Date Taking? Authorizing Provider  naproxen (NAPROSYN) 500 MG tablet Take 1 tablet (500 mg total) by mouth 2 (two) times daily with a meal for 7 days. 10/31/20 11/07/20 Yes Shaune Pollack, MD  metFORMIN (GLUCOPHAGE-XR) 500 MG 24 hr tablet Take 1 tablet (500 mg total) by mouth 2 (two) times daily with a meal. 09/03/20   Johnson, Megan P, DO  omeprazole (PRILOSEC) 20 MG capsule Take 1  capsule (20 mg total) by mouth daily. 06/11/20   Johnson, Megan P, DO  sertraline (ZOLOFT) 100 MG tablet Take 1 tablet (100 mg total) by mouth daily. 09/03/20   Johnson, Megan P, DO  zolpidem (AMBIEN) 5 MG tablet Take 1 tablet (5 mg total) by mouth at bedtime as needed. for sleep 09/03/20   Olevia Perches P, DO    Allergies Patient has no known allergies.  Family History  Problem Relation Age of Onset  . Diabetes Mother   . Hyperlipidemia Mother   . Hypertension Mother   . Hypertension Father   . Hypertension Brother     Social History Social History   Tobacco Use  . Smoking status: Never Smoker  . Smokeless tobacco: Never Used  Vaping Use  . Vaping Use: Never used  Substance Use Topics  . Alcohol use: Not Currently    Comment: none as of 07/20/20 per patient  . Drug use: No    Review of Systems  Review of Systems  Constitutional: Negative for fatigue and fever.  HENT: Negative for congestion and sore throat.   Eyes: Negative for visual disturbance.  Respiratory: Positive for chest tightness. Negative for cough and shortness of breath.   Cardiovascular: Positive for chest pain.  Gastrointestinal: Negative for abdominal pain, diarrhea, nausea and vomiting.  Genitourinary: Negative for flank pain.  Musculoskeletal: Negative for back pain and neck pain.  Skin: Negative for rash and wound.  Neurological: Negative for weakness.  All other systems reviewed and are negative.  ____________________________________________  PHYSICAL EXAM:      VITAL SIGNS: ED Triage Vitals  Enc Vitals Group     BP 10/31/20 1300 125/89     Pulse Rate 10/31/20 1300 99     Resp 10/31/20 1300 18     Temp 10/31/20 1300 98.7 F (37.1 C)     Temp Source 10/31/20 1300 Oral     SpO2 10/31/20 1300 100 %     Weight 10/31/20 1257 196 lb (88.9 kg)     Height 10/31/20 1257 5\' 5"  (1.651 m)     Head Circumference --      Peak Flow --      Pain Score 10/31/20 1257 6     Pain Loc --      Pain Edu?  --      Excl. in GC? --      Physical Exam Vitals and nursing note reviewed.  Constitutional:      General: She is not in acute distress.    Appearance: She is well-developed.  HENT:     Head: Normocephalic and atraumatic.  Eyes:     Conjunctiva/sclera: Conjunctivae normal.  Cardiovascular:     Rate and Rhythm: Normal rate and regular rhythm.     Heart sounds: Normal heart sounds.  Pulmonary:     Effort: Pulmonary effort is normal. No respiratory distress.     Breath sounds: No wheezing.  Chest:     Comments: Moderate TTP over lower sternal border, as well as R lower parasternal intercostal spaces. No overlying rash or skin changes. Abdominal:     General: There is no distension.  Musculoskeletal:     Cervical back: Neck supple.  Skin:    General: Skin is warm.     Capillary Refill: Capillary refill takes less than 2 seconds.     Findings: No rash.  Neurological:     Mental Status: She is alert and oriented to person, place, and time.     Motor: No abnormal muscle tone.       ____________________________________________   LABS (all labs ordered are listed, but only abnormal results are displayed)  Labs Reviewed  BASIC METABOLIC PANEL - Abnormal; Notable for the following components:      Result Value   Glucose, Bld 170 (*)    BUN <5 (*)    Calcium 8.6 (*)    All other components within normal limits  CBC - Abnormal; Notable for the following components:   Hemoglobin 11.7 (*)    All other components within normal limits  POC URINE PREG, ED  TROPONIN I (HIGH SENSITIVITY)  TROPONIN I (HIGH SENSITIVITY)    ____________________________________________  EKG: Normal sinus rhythm, VR 96. PR 152, QRS 70, QTc 464. No acute St elevations or depressions. No ischemia or infarct. ________________________________________  RADIOLOGY All imaging, including plain films, CT scans, and ultrasounds, independently reviewed by me, and interpretations confirmed via formal  radiology reads.  ED MD interpretation:   CXR: Clear  Official radiology report(s): DG Chest 2 View  Result Date: 10/31/2020 CLINICAL DATA:  Chest pain.  Symptoms for 1 week.  Today is worse. EXAM: CHEST - 2 VIEW COMPARISON:  08/22/2019 FINDINGS: Shallow lung inflation. Heart size is normal. There is mild elevation of LEFT hemidiaphragm likely accentuated by low lung volumes. No focal consolidations or pleural effusions. No pulmonary edema. IMPRESSION: No active cardiopulmonary disease. Electronically Signed   By: 10/20/2019 M.D.   On: 10/31/2020 13:41    ____________________________________________  PROCEDURES  Procedure(s) performed (including Critical Care):  Procedures  ____________________________________________  INITIAL IMPRESSION / MDM / ASSESSMENT AND PLAN / ED COURSE  As part of my medical decision making, I reviewed the following data within the electronic MEDICAL RECORD NUMBER Nursing notes reviewed and incorporated, Old chart reviewed, Notes from prior ED visits, and [place] Controlled Substance Database       *Maria Little was evaluated in Emergency Department on 10/31/2020 for the symptoms described in the history of present illness. She was evaluated in the context of the global COVID-19 pandemic, which necessitated consideration that the patient might be at risk for infection with the SARS-CoV-2 virus that causes COVID-19. Institutional protocols and algorithms that pertain to the evaluation of patients at risk for COVID-19 are in a state of rapid change based on information released by regulatory bodies including the CDC and federal and state organizations. These policies and algorithms were followed during the patient's care in the ED.  Some ED evaluations and interventions may be delayed as a result of limited staffing during the pandemic.*     Medical Decision Making:  45 yo F here with substernal chest pain that is sharp, positional, and began after lifting  boxes at community service. Suspect MSk chest pain, possibly costochondritis. EKG is nonischemic and trop neg despite constant sx >12 hr, do not suspect ACS. Pt has no leg swelling, cough, SOB, or other signs of DVT/PE. Pain is not c/w dissection. CXR reviewed and is unremarkable. No abd TTP or signs of referred intra-abd pathology. Will tx with NSAIDs, limited lifting at work, and follow-up.  ____________________________________________  FINAL CLINICAL IMPRESSION(S) / ED DIAGNOSES  Final diagnoses:  Chest wall pain     MEDICATIONS GIVEN DURING THIS VISIT:  Medications - No data to display   ED Discharge Orders         Ordered    naproxen (NAPROSYN) 500 MG tablet  2 times daily with meals        10/31/20 1422           Note:  This document was prepared using Dragon voice recognition software and may include unintentional dictation errors.   Shaune Pollack, MD 10/31/20 1806

## 2020-11-12 ENCOUNTER — Other Ambulatory Visit: Payer: Self-pay

## 2020-11-12 ENCOUNTER — Ambulatory Visit (INDEPENDENT_AMBULATORY_CARE_PROVIDER_SITE_OTHER): Payer: BC Managed Care – PPO

## 2020-11-12 DIAGNOSIS — Z3042 Encounter for surveillance of injectable contraceptive: Secondary | ICD-10-CM | POA: Diagnosis not present

## 2020-11-12 MED ORDER — MEDROXYPROGESTERONE ACETATE 150 MG/ML IM SUSP
150.0000 mg | Freq: Once | INTRAMUSCULAR | Status: AC
  Administered 2020-11-12: 150 mg via INTRAMUSCULAR

## 2020-11-26 ENCOUNTER — Ambulatory Visit: Payer: BC Managed Care – PPO | Admitting: Family Medicine

## 2021-01-28 ENCOUNTER — Other Ambulatory Visit: Payer: Self-pay

## 2021-01-28 ENCOUNTER — Ambulatory Visit (INDEPENDENT_AMBULATORY_CARE_PROVIDER_SITE_OTHER): Payer: BC Managed Care – PPO

## 2021-01-28 DIAGNOSIS — Z3042 Encounter for surveillance of injectable contraceptive: Secondary | ICD-10-CM | POA: Diagnosis not present

## 2021-01-28 MED ORDER — MEDROXYPROGESTERONE ACETATE 150 MG/ML IM SUSP
150.0000 mg | Freq: Once | INTRAMUSCULAR | Status: AC
  Administered 2021-01-28: 150 mg via INTRAMUSCULAR

## 2021-01-30 ENCOUNTER — Other Ambulatory Visit: Payer: Self-pay | Admitting: Family Medicine

## 2021-01-31 NOTE — Telephone Encounter (Signed)
Requested medication (s) are due for refill today: yes  Requested medication (s) are on the active medication list: yes  Last refill:  09/03/20  Future visit scheduled: yes  Notes to clinic: Repeat A1C due (last A1C  09/03/20 8.0)   Requested Prescriptions  Pending Prescriptions Disp Refills   metFORMIN (GLUCOPHAGE-XR) 500 MG 24 hr tablet [Pharmacy Med Name: metFORMIN HCl ER 500 MG Oral Tablet Extended Release 24 Hour] 180 tablet 0    Sig: TAKE 1 TABLET BY MOUTH TWICE DAILY WITH A MEAL      Endocrinology:  Diabetes - Biguanides Failed - 01/30/2021  7:38 PM      Failed - HBA1C is between 0 and 7.9 and within 180 days    HB A1C (BAYER DCA - WAIVED)  Date Value Ref Range Status  09/03/2020 8.0 (H) <7.0 % Final    Comment:                                          Diabetic Adult            <7.0                                       Healthy Adult        4.3 - 5.7                                                           (DCCT/NGSP) American Diabetes Association's Summary of Glycemic Recommendations for Adults with Diabetes: Hemoglobin A1c <7.0%. More stringent glycemic goals (A1c <6.0%) may further reduce complications at the cost of increased risk of hypoglycemia.           Passed - Cr in normal range and within 360 days    Creatinine  Date Value Ref Range Status  09/13/2011 0.79 0.60 - 1.30 mg/dL Final   Creatinine, Ser  Date Value Ref Range Status  10/31/2020 0.65 0.44 - 1.00 mg/dL Final          Passed - AA eGFR in normal range and within 360 days    EGFR (African American)  Date Value Ref Range Status  09/13/2011 >60 >30m/min Final   GFR calc Af Amer  Date Value Ref Range Status  09/03/2020 123 >59 mL/min/1.73 Final    Comment:    **In accordance with recommendations from the NKF-ASN Task force,**   [facility] is in the process of updating its eGFR calculation to the   2021 CKD-EPI creatinine equation that estimates kidney function   without a race variable.     EGFR (Non-African Amer.)  Date Value Ref Range Status  09/13/2011 >60 >650mmin Final    Comment:    eGFR values <6016min/1.73 m2 may be an indication of chronic kidney disease (CKD). Calculated eGFR, using the MRDR Study equation, is useful in  patients with stable renal function. The eGFR calculation will not be reliable in acutely ill patients when serum creatinine is changing rapidly. It is not useful in patients on dialysis. The eGFR calculation may not be applicable to patients at the low and high extremes of body sizes,  pregnant women, and vegetarians.    GFR, Estimated  Date Value Ref Range Status  10/31/2020 >60 >60 mL/min Final    Comment:    (NOTE) Calculated using the CKD-EPI Creatinine Equation (2021)    eGFR  Date Value Ref Range Status  10/15/2020 111 >59 mL/min/1.73 Final          Passed - Valid encounter within last 6 months    Recent Outpatient Visits           3 months ago Type 2 diabetes mellitus with hyperglycemia, without long-term current use of insulin ([place])   [facility], Megan P, DO   5 months ago Type 2 diabetes mellitus with hyperglycemia, without long-term current use of insulin ([place])   [place], [place], DO   1 year ago Psychophysiological insomnia   [place], Megan P, DO   1 year ago Fever, unspecified fever cause   [facility] Kathrine Haddock, NP   1 year ago Routine general medical examination at a health care facility   [place], Barb Merino, DO       Future Appointments             In 1 week Wynetta Emery, Barb Merino, DO [facility]

## 2021-02-02 NOTE — Telephone Encounter (Signed)
Please find out if she has enough to get to her appointment.  

## 2021-02-02 NOTE — Telephone Encounter (Signed)
Pt has apt on 02/13/2021 

## 2021-02-02 NOTE — Telephone Encounter (Signed)
Spoke with patient and she says she is completely out of her Metformin prescription.

## 2021-02-02 NOTE — Telephone Encounter (Signed)
FYI

## 2021-02-04 IMAGING — CR CHEST - 2 VIEW
2 series · 2 of 2 positions shown · non-contrast
Comparison: Chest x-ray dated 10/03/2012.

CLINICAL DATA: Midsternal chest pain since last [REDACTED]. Pain
worsens when sitting or lying down.

EXAM:
CHEST - 2 VIEW

[chest pa]
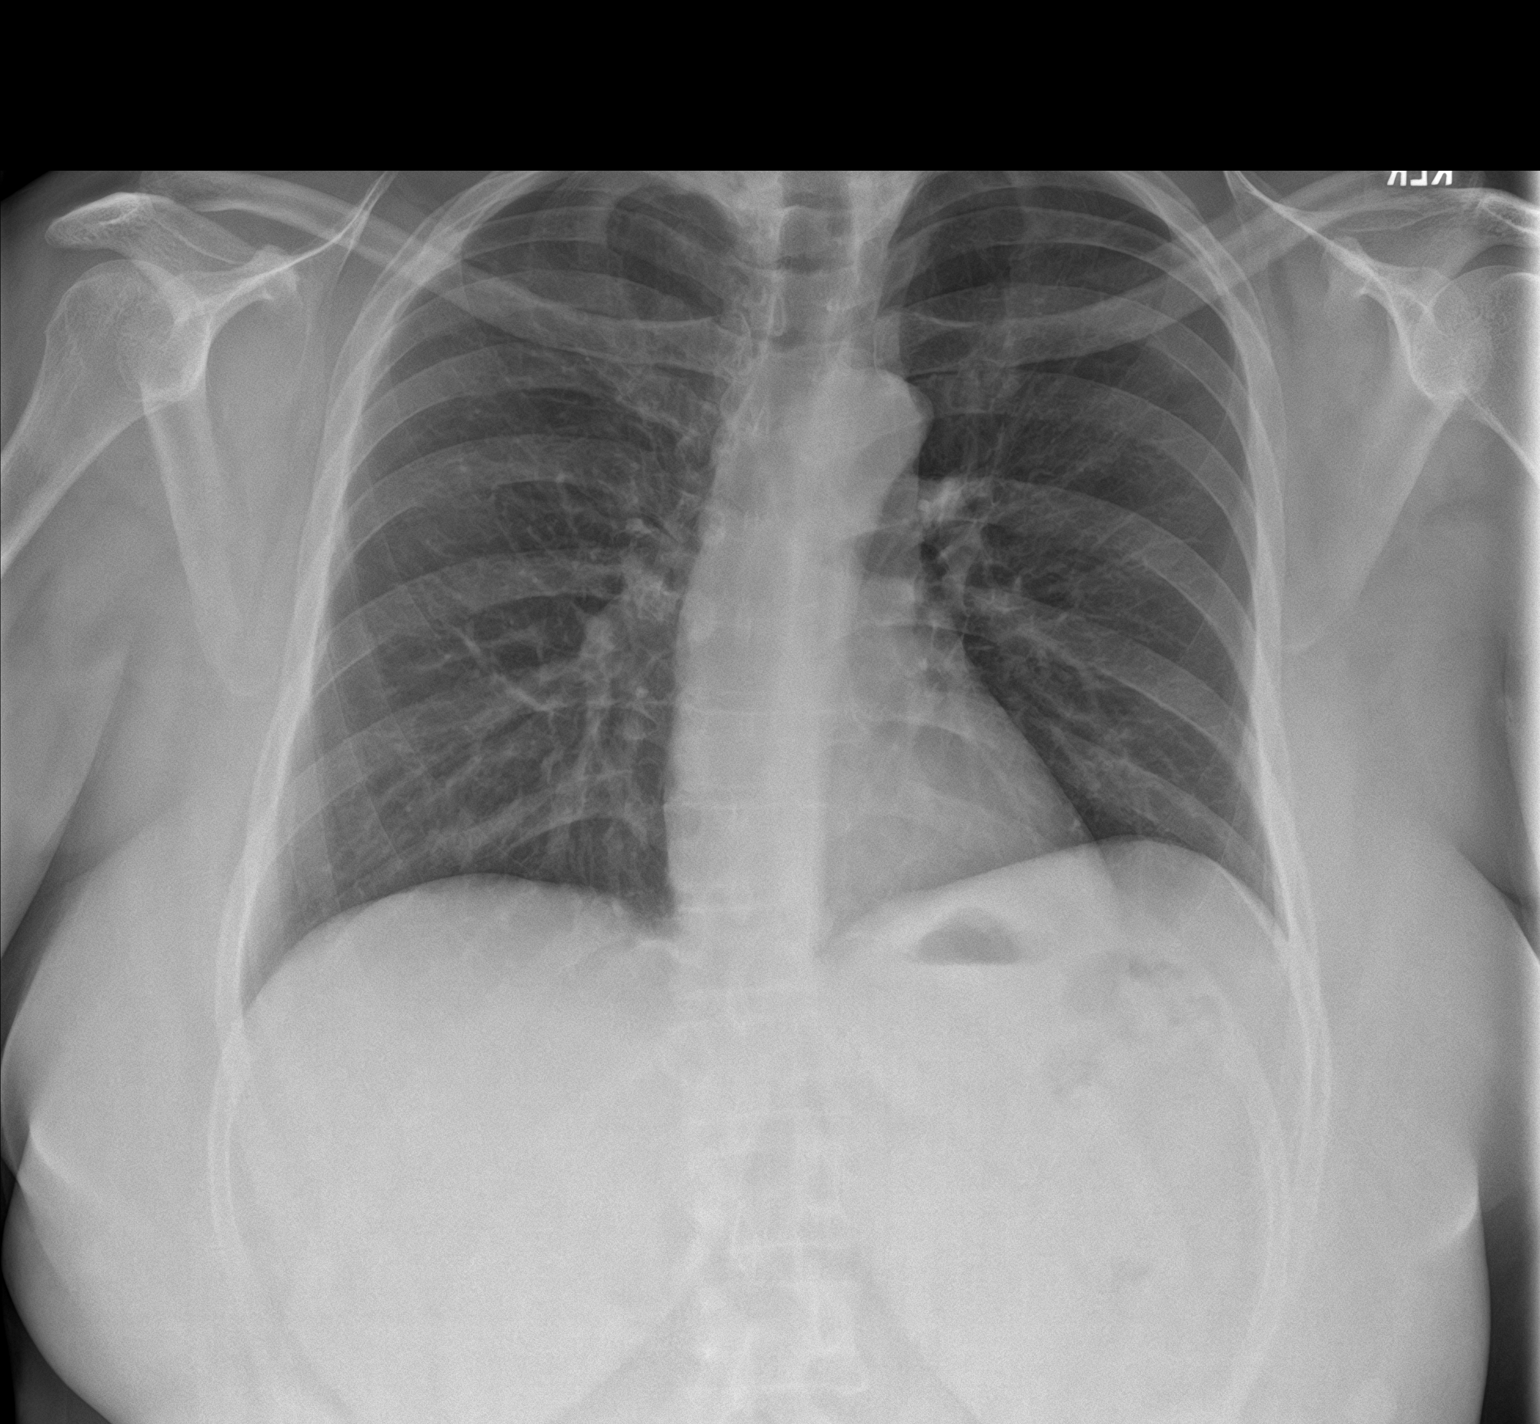

[chest lat]
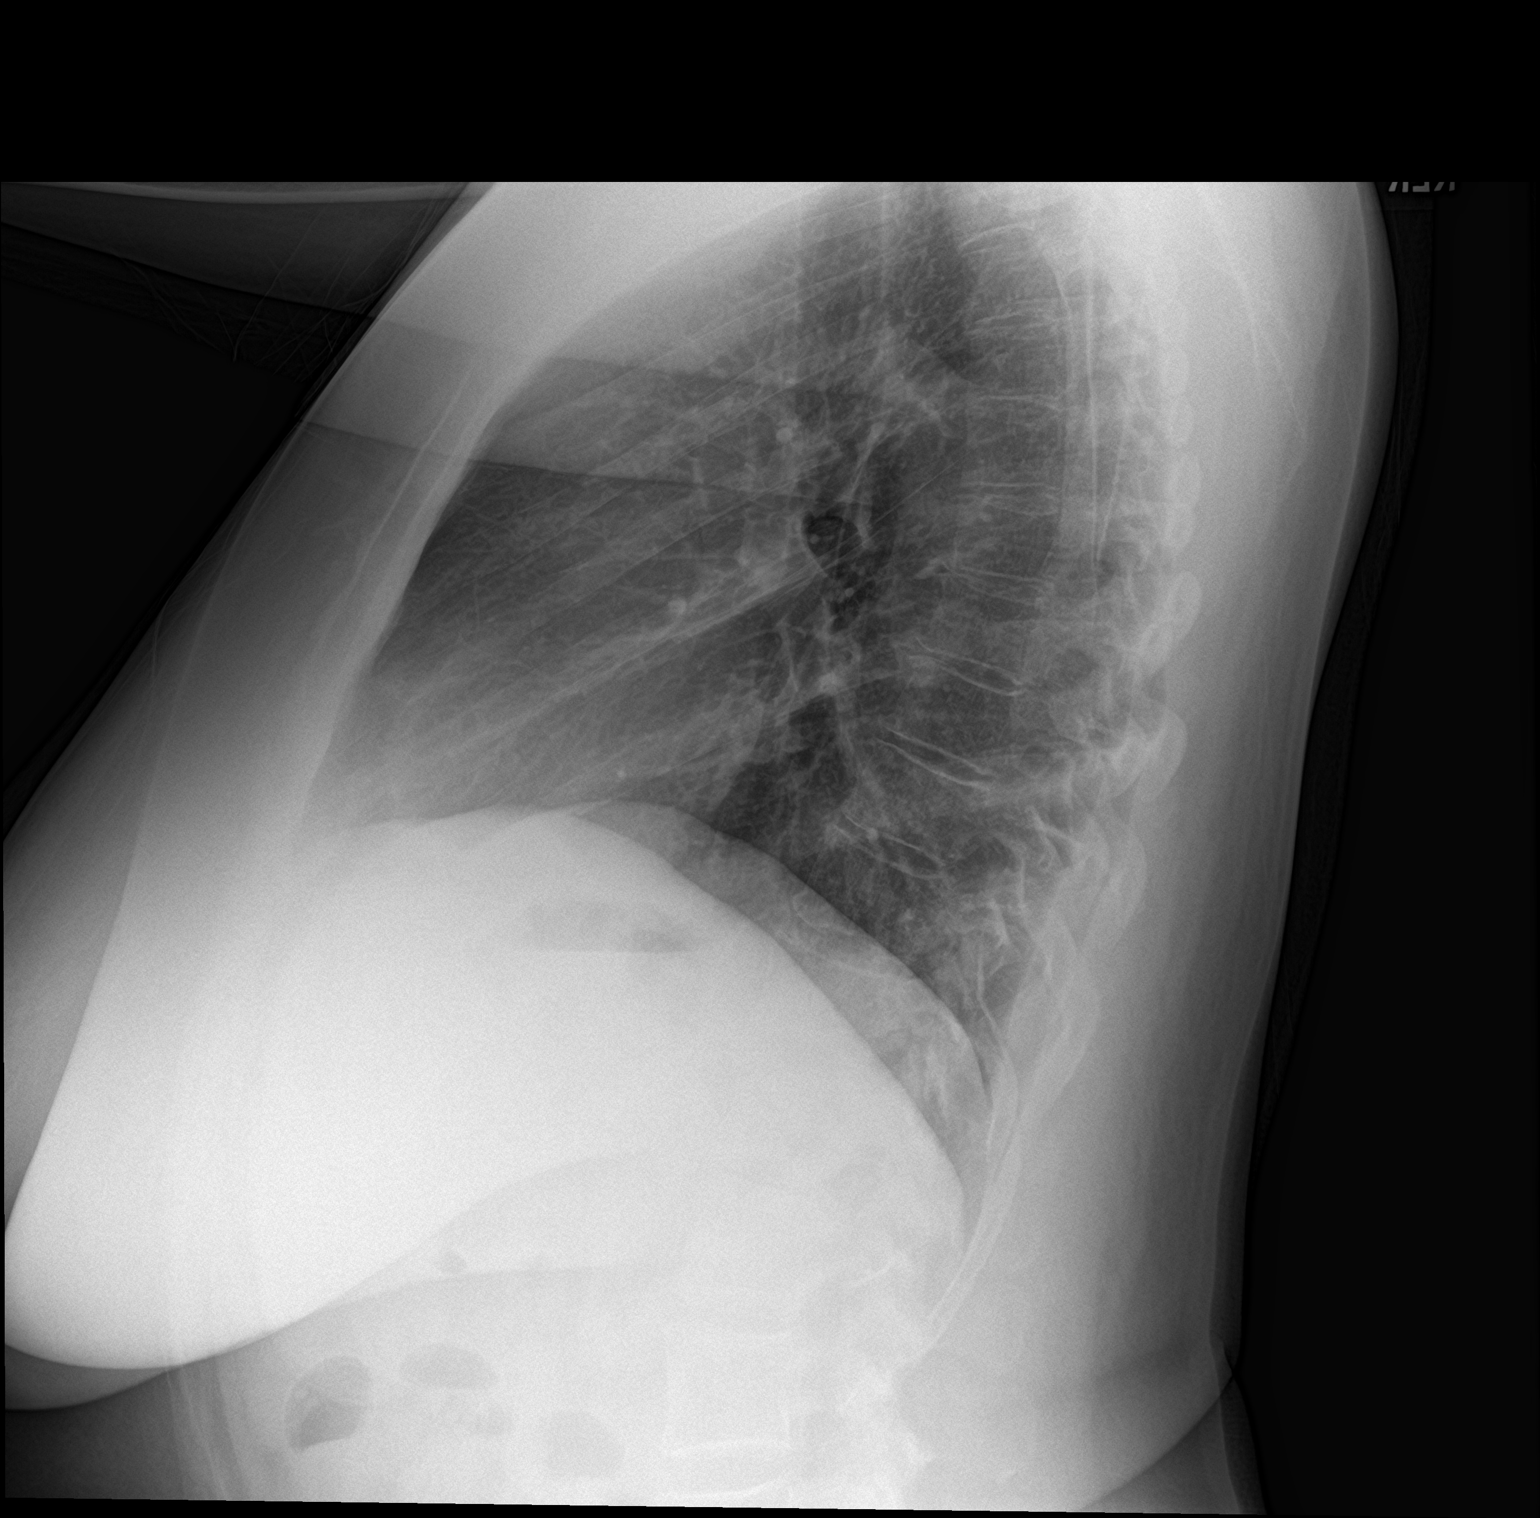

[2 of 2 positions shown; findings below may reference images not displayed]

FINDINGS: Heart size and mediastinal contours are within normal limits. Lungs
are clear. No pleural effusion or pneumothorax seen. Osseous
structures about the chest are unremarkable.
IMPRESSION: No active cardiopulmonary disease. No evidence of pneumonia or
pulmonary edema.

## 2021-02-13 ENCOUNTER — Ambulatory Visit (INDEPENDENT_AMBULATORY_CARE_PROVIDER_SITE_OTHER): Payer: BC Managed Care – PPO | Admitting: Family Medicine

## 2021-02-13 ENCOUNTER — Encounter: Payer: Self-pay | Admitting: Family Medicine

## 2021-02-13 ENCOUNTER — Other Ambulatory Visit: Payer: Self-pay

## 2021-02-13 VITALS — BP 120/87 | HR 96 | Temp 99.2°F | Wt 202.0 lb

## 2021-02-13 DIAGNOSIS — F5104 Psychophysiologic insomnia: Secondary | ICD-10-CM | POA: Diagnosis not present

## 2021-02-13 DIAGNOSIS — E1165 Type 2 diabetes mellitus with hyperglycemia: Secondary | ICD-10-CM | POA: Diagnosis not present

## 2021-02-13 DIAGNOSIS — B078 Other viral warts: Secondary | ICD-10-CM

## 2021-02-13 DIAGNOSIS — F331 Major depressive disorder, recurrent, moderate: Secondary | ICD-10-CM | POA: Diagnosis not present

## 2021-02-13 LAB — BAYER DCA HB A1C WAIVED: HB A1C (BAYER DCA - WAIVED): 7 % — ABNORMAL HIGH (ref <7.0)

## 2021-02-13 MED ORDER — METFORMIN HCL ER 500 MG PO TB24: 1000.0000 mg | ORAL_TABLET | Freq: Every day | ORAL | 1 refills | Status: DC

## 2021-02-13 MED ORDER — ZOLPIDEM TARTRATE 5 MG PO TABS: 5.0000 mg | ORAL_TABLET | Freq: Every evening | ORAL | 0 refills | Status: DC | PRN

## 2021-02-13 MED ORDER — SERTRALINE HCL 100 MG PO TABS: 150.0000 mg | ORAL_TABLET | Freq: Every day | ORAL | 1 refills | Status: DC

## 2021-02-13 NOTE — Progress Notes (Signed)
BP 120/87   Pulse 96   Temp 99.2 F (37.3 C)   Wt 202 lb (91.6 kg)   SpO2 99%   BMI 33.61 kg/m    Subjective:    Patient ID: Maria Little, female    DOB: 25-Jul-1975, 45 y.o.   MRN: [id number]  HPI: Maria Little is a 45 y.o. female  Chief Complaint  Patient presents with   Diabetes    Place on right foot that is hard   Insomnia   DIABETES Hypoglycemic episodes:no Polydipsia/polyuria: no Visual disturbance: no Chest pain: no Paresthesias: yes Glucose Monitoring: yes  Accucheck frequency: Daily  Fasting glucose: 215 Taking Insulin?: no Blood Pressure Monitoring: not checking Retinal Examination: Not up to Date Foot Exam: Up to Date Diabetic Education: Completed Pneumovax: Up to Date Influenza: Not up to Date Aspirin: no  DEPRESSION Mood status: stable Satisfied with current treatment?: yes Symptom severity: mild  Duration of current treatment : chronic Side effects: no Medication compliance: excellent compliance Psychotherapy/counseling: no  Previous psychiatric medications: sertraline Depressed mood: no Anxious mood: no Anhedonia: no Significant weight loss or gain: no Insomnia: no  Fatigue: no Feelings of worthlessness or guilt: no Impaired concentration/indecisiveness: no Suicidal ideations: no Hopelessness: no Crying spells: no Depression screen [facility] 2/9 02/13/2021 10/15/2020 09/03/2020 11/19/2019 10/31/2019  Decreased Interest 1 0 0 0 0  Down, Depressed, Hopeless 1 1 1  0 2  PHQ - 2 Score 2 1 1  0 2  Altered sleeping 3 3 3 3 3   Tired, decreased energy 0 0 0 0 0  Change in appetite 3 3 1  0 1  Feeling bad or failure about yourself  0 0 3 3 2   Trouble concentrating 0 1 0 0 0  Moving slowly or fidgety/restless 0 0 0 0 0  Suicidal thoughts 0 1 0 0 0  PHQ-9 Score 8 9 8 6 8   Difficult doing work/chores Somewhat difficult Somewhat difficult Somewhat difficult Somewhat difficult Somewhat difficult  Some recent data might be hidden     Relevant  past medical, surgical, family and social history reviewed and updated as indicated. Interim medical history since our last visit reviewed. Allergies and medications reviewed and updated.  Review of Systems  Constitutional: Negative.   Respiratory: Negative.    Cardiovascular: Negative.   Gastrointestinal: Negative.   Musculoskeletal: Negative.   Skin: Negative.   Psychiatric/Behavioral:  Positive for dysphoric mood. Negative for agitation, behavioral problems, confusion, decreased concentration, hallucinations, self-injury, sleep disturbance and suicidal ideas. The patient is not nervous/anxious and is not hyperactive.    Per HPI unless specifically indicated above     Objective:    BP 120/87   Pulse 96   Temp 99.2 F (37.3 C)   Wt 202 lb (91.6 kg)   SpO2 99%   BMI 33.61 kg/m   Wt Readings from Last 3 Encounters:  02/13/21 202 lb (91.6 kg)  10/31/20 196 lb (88.9 kg)  10/15/20 196 lb 12.8 oz (89.3 kg)    Physical Exam Vitals and nursing note reviewed.  Constitutional:      General: She is not in acute distress.    Appearance: Normal appearance. She is not ill-appearing, toxic-appearing or diaphoretic.  HENT:     Head: Normocephalic and atraumatic.     Right Ear: External ear normal.     Left Ear: External ear normal.     Nose: Nose normal.     Mouth/Throat:     Mouth: Mucous membranes are moist.  Pharynx: Oropharynx is clear.  Eyes:     General: No scleral icterus.       Right eye: No discharge.        Left eye: No discharge.     Extraocular Movements: Extraocular movements intact.     Conjunctiva/sclera: Conjunctivae normal.     Pupils: Pupils are equal, round, and reactive to light.  Cardiovascular:     Rate and Rhythm: Normal rate and regular rhythm.     Pulses: Normal pulses.     Heart sounds: Normal heart sounds. No murmur heard.   No friction rub. No gallop.  Pulmonary:     Effort: Pulmonary effort is normal. No respiratory distress.     Breath  sounds: Normal breath sounds. No stridor. No wheezing, rhonchi or rales.  Chest:     Chest wall: No tenderness.  Musculoskeletal:        General: Normal range of motion.     Cervical back: Normal range of motion and neck supple.  Skin:    General: Skin is warm and dry.     Capillary Refill: Capillary refill takes less than 2 seconds.     Coloration: Skin is not jaundiced or pale.     Findings: No bruising, erythema, lesion or rash.  Neurological:     General: No focal deficit present.     Mental Status: She is alert and oriented to person, place, and time. Mental status is at baseline.  Psychiatric:        Mood and Affect: Mood normal.        Behavior: Behavior normal.        Thought Content: Thought content normal.        Judgment: Judgment normal.    Results for orders placed or performed during the hospital encounter of 10/31/20  Basic metabolic panel  Result Value Ref Range   Sodium 137 135 - 145 mmol/L   Potassium 3.6 3.5 - 5.1 mmol/L   Chloride 106 98 - 111 mmol/L   CO2 23 22 - 32 mmol/L   Glucose, Bld 170 (H) 70 - 99 mg/dL   BUN <5 (L) 6 - 20 mg/dL   Creatinine, Ser 6.04 0.44 - 1.00 mg/dL   Calcium 8.6 (L) 8.9 - 10.3 mg/dL   GFR, Estimated >54 >09 mL/min   Anion gap 8 5 - 15  CBC  Result Value Ref Range   WBC 7.6 4.0 - 10.5 K/uL   RBC 4.36 3.87 - 5.11 MIL/uL   Hemoglobin 11.7 (L) 12.0 - 15.0 g/dL   HCT 81.1 91.4 - 78.2 %   MCV 82.8 80.0 - 100.0 fL   MCH 26.8 26.0 - 34.0 pg   MCHC 32.4 30.0 - 36.0 g/dL   RDW 95.6 21.3 - 08.6 %   Platelets 304 150 - 400 K/uL   nRBC 0.0 0.0 - 0.2 %  Troponin I (High Sensitivity)  Result Value Ref Range   Troponin I (High Sensitivity) 3 <18 ng/L      Assessment & Plan:   Problem List Items Addressed This Visit       Endocrine   Type 2 diabetes mellitus with hyperglycemia, without long-term current use of insulin (HCC) - Primary    Doing better with A1c of 7- has not been consistently taking her 2nd dose of metformin. Will  change to 1000mg  daily and recheck 3 months.        Relevant Medications   metFORMIN (GLUCOPHAGE-XR) 500 MG 24 hr tablet  Other Relevant Orders   Bayer DCA Hb A1c Waived   Ambulatory referral to Ophthalmology     Other   Depression, major, recurrent, moderate (HCC)    Not doing well enough- will increase her sertraline to 150 and recheck 3 months at physical       Relevant Medications   sertraline (ZOLOFT) 100 MG tablet   Psychophysiological insomnia    Using ambien rarely. 30 pills given today- should last 3+ months.        Other Visit Diagnoses     Other viral warts       Reassured patient. Continue to monitor.         Follow up plan: Return in about 3 months (around 05/16/2021) for physical.

## 2021-02-13 NOTE — Assessment & Plan Note (Signed)
Using [place] rarely. 30 pills given today- should last 3+ months.

## 2021-02-13 NOTE — Assessment & Plan Note (Signed)
Not doing well enough- will increase her sertraline to 150 and recheck 3 months at physical

## 2021-02-13 NOTE — Assessment & Plan Note (Signed)
Doing better with A1c of 7- has not been consistently taking her 2nd dose of metformin. Will change to 1000mg  daily and recheck 3 months.

## 2021-03-19 ENCOUNTER — Emergency Department
Admission: EM | Admit: 2021-03-19 | Discharge: 2021-03-19 | Disposition: A | Discharge status: Home / Self Care | Payer: BC Managed Care – PPO | Attending: Emergency Medicine | Admitting: Emergency Medicine

## 2021-03-19 ENCOUNTER — Encounter: Payer: Self-pay | Admitting: Emergency Medicine

## 2021-03-19 ENCOUNTER — Other Ambulatory Visit: Payer: Self-pay

## 2021-03-19 DIAGNOSIS — X58XXXA Exposure to other specified factors, initial encounter: Secondary | ICD-10-CM | POA: Diagnosis not present

## 2021-03-19 DIAGNOSIS — Z7984 Long term (current) use of oral hypoglycemic drugs: Secondary | ICD-10-CM | POA: Insufficient documentation

## 2021-03-19 DIAGNOSIS — S0591XA Unspecified injury of right eye and orbit, initial encounter: Secondary | ICD-10-CM | POA: Diagnosis not present

## 2021-03-19 DIAGNOSIS — S0501XA Injury of conjunctiva and corneal abrasion without foreign body, right eye, initial encounter: Secondary | ICD-10-CM | POA: Diagnosis not present

## 2021-03-19 DIAGNOSIS — E119 Type 2 diabetes mellitus without complications: Secondary | ICD-10-CM | POA: Insufficient documentation

## 2021-03-19 MED ORDER — FLUORESCEIN SODIUM 1 MG OP STRP
1.0000 | ORAL_STRIP | Freq: Once | OPHTHALMIC | Status: AC
  Administered 2021-03-19: 1 via OPHTHALMIC
  Filled 2021-03-19: qty 1

## 2021-03-19 MED ORDER — POLYMYXIN B-TRIMETHOPRIM 10000-0.1 UNIT/ML-% OP SOLN: 1.0000 [drp] | OPHTHALMIC | 0 refills | Status: AC

## 2021-03-19 MED ORDER — TETRACAINE HCL 0.5 % OP SOLN
1.0000 [drp] | Freq: Once | OPHTHALMIC | Status: AC
  Administered 2021-03-19: 1 [drp] via OPHTHALMIC

## 2021-03-19 MED ORDER — PROPARACAINE HCL 0.5 % OP SOLN: 1.0000 [drp] | Freq: Once | OPHTHALMIC | Status: DC

## 2021-03-19 NOTE — ED Triage Notes (Signed)
Patient ambulatory to triage with steady gait, without difficulty or distress noted; pt reports since last night having rt eye pain/redness and tearing; denies any injury; no use of corrective lenses

## 2021-03-19 NOTE — ED Provider Notes (Signed)
[facility] Emergency Department Provider Note   ____________________________________________   Event Date/Time   First MD Initiated Contact with Patient 03/19/21 [phone]     (approximate)  I have reviewed the triage vital signs and the nursing notes.   HISTORY  Chief Complaint Eye Pain    HPI Maria Little is a 45 y.o. female who presents for right eye pain  LOCATION: Right eye DURATION: 12 hours prior to arrival TIMING: Worsening since onset SEVERITY: Severe QUALITY: Burning CONTEXT: Patient states that she first noticed pain, swelling, redness, and clear drainage from the right eye that began last night and has been worsening since onset MODIFYING FACTORS: Palpation to the right eyes somewhat worsens this pain and is partially relieved at rest ASSOCIATED SYMPTOMS: Tearing at the right eye   Per medical record review, patient has history of type 2 diabetes          Past Medical History:  Diagnosis Date   Diabetes mellitus without complication (HCC)    Insomnia    Migraine     Patient Active Problem List   Diagnosis Date Noted   Type 2 diabetes mellitus with hyperglycemia, without long-term current use of insulin (HCC) 09/03/2020   Psychophysiological insomnia 11/20/2019   Hyperlipidemia 10/11/2016   Depression, major, recurrent, moderate (HCC) 10/05/2016   Gastroesophageal reflux disease 10/05/2016    Past Surgical History:  Procedure Laterality Date   BREAST SURGERY     Lumpectomy    Prior to Admission medications   Medication Sig Start Date End Date Taking? Authorizing Provider  trimethoprim-polymyxin b (POLYTRIM) ophthalmic solution Place 1 drop into the right eye every 4 (four) hours for 5 days. 03/19/21 03/24/21 Yes Merwyn Katos, MD  metFORMIN (GLUCOPHAGE-XR) 500 MG 24 hr tablet Take 2 tablets (1,000 mg total) by mouth daily with breakfast. 02/13/21   Olevia Perches P, DO  omeprazole (PRILOSEC) 20 MG capsule Take 1  capsule (20 mg total) by mouth daily. 06/11/20   Johnson, Megan P, DO  sertraline (ZOLOFT) 100 MG tablet Take 1.5 tablets (150 mg total) by mouth daily. 02/13/21   Johnson, Megan P, DO  zolpidem (AMBIEN) 5 MG tablet Take 1 tablet (5 mg total) by mouth at bedtime as needed. for sleep 02/13/21   Olevia Perches P, DO    Allergies Patient has no known allergies.  Family History  Problem Relation Age of Onset   Diabetes Mother    Hyperlipidemia Mother    Hypertension Mother    Hypertension Father    Hypertension Brother     Social History Social History   Tobacco Use   Smoking status: Never   Smokeless tobacco: Never  Vaping Use   Vaping Use: Never used  Substance Use Topics   Alcohol use: Not Currently    Comment: none as of 07/20/20 per patient   Drug use: No    Review of Systems Constitutional: No fever/chills Eyes: Right eye pain, redness, tearing, and swelling.  No visual changes. ENT: No sore throat. Cardiovascular: Denies chest pain. Respiratory: Denies shortness of breath. Gastrointestinal: No abdominal pain.  No nausea, no vomiting.  No diarrhea. Genitourinary: Negative for dysuria. Musculoskeletal: Negative for acute arthralgias Skin: Negative for rash. Neurological: Negative for headaches, weakness/numbness/paresthesias in any extremity Psychiatric: Negative for suicidal ideation/homicidal ideation   ____________________________________________   PHYSICAL EXAM:  VITAL SIGNS: ED Triage Vitals  Enc Vitals Group     BP 03/19/21 0527 (!) 144/99     Pulse Rate 03/19/21 0527  83     Resp 03/19/21 0527 18     Temp 03/19/21 0527 98 F (36.7 C)     Temp Source 03/19/21 0527 Oral     SpO2 03/19/21 0527 100 %     Weight 03/19/21 0525 206 lb (93.4 kg)     Height 03/19/21 0525 5\' 4"  (1.626 m)     Head Circumference --      Peak Flow --      Pain Score 03/19/21 0525 8     Pain Loc --      Pain Edu? --      Excl. in GC? --    Constitutional: Alert and oriented.  Well appearing and in no acute distress. Eyes: Conjunctivae are injected bilaterally with right worse than left.  EOMI.OU IOP 16. OD IOP 18. OD VA 20/20. OD Woods lamp exam shows central corneal ulceration. PERRL.  Eversion of upper and lower eyelids on the right and left did not show any evidence of foreign bodies Head: Atraumatic. Nose: No congestion/rhinnorhea. Mouth/Throat: Mucous membranes are moist. Neck: No stridor Cardiovascular: Grossly normal heart sounds.  Good peripheral circulation. Respiratory: Normal respiratory effort.  No retractions. Gastrointestinal: Soft and nontender. No distention. Musculoskeletal: No obvious deformities Neurologic:  Normal speech and language. No gross focal neurologic deficits are appreciated. Skin:  Skin is warm and dry. No rash noted. Psychiatric: Mood and affect are normal. Speech and behavior are normal.  ____________________________________________   LABS (all labs ordered are listed, but only abnormal results are displayed)  Labs Reviewed - No data to display PROCEDURES  Procedure(s) performed (including Critical Care):  Procedures   ____________________________________________   INITIAL IMPRESSION / ASSESSMENT AND PLAN / ED COURSE  As part of my medical decision making, I reviewed the following data within the electronic medical record, if available:  Nursing notes reviewed and incorporated, Labs reviewed, EKG interpreted, Old chart reviewed, Radiograph reviewed and Notes from prior ED visits reviewed and incorporated        45 year old-year-old female with history and exam consistent with corneal abrasion of the right eye.  Initial considerations in this patient included corneal abrasion, intraocular and corneal foreign bodies, corneal ulceration, various etiologies of iritis, and various etiologies of conjunctivitis amongst others.   Patient presented with eye pain and redness with associated photophobia and foreign body  sensation suggestive of corneal abrasion.  Patient noted to have corneal abrasion on fluorescein examination with wood's lamp of the eye.  No evidence of foreign bodies with eversion of the eyelid.  Patient [reports/denies] contact lens use, and has no other findings suggestive of corneal ulceration at this time.  No evidence of a positive Seidel test or other findings suggestive of globe perforation on evaluation in the ED.  No evidence of corneal foreign body on exam.   Significant improvement in pain and visual acuity noted with application of topical anesthetic to the eye.  Prior to discharge, we discussed return precautions, treatment with lubricating eye drops and NSAIDs, and follow up with primary care doctor within 1 week as needed for further evaluation, and the patient demonstrated understanding and agreement.      ____________________________________________   FINAL CLINICAL IMPRESSION(S) / ED DIAGNOSES  Final diagnoses:  Abrasion of right cornea, initial encounter     ED Discharge Orders          Ordered    trimethoprim-polymyxin b (POLYTRIM) ophthalmic solution  Every 4 hours        03/19/21 0635  Note:  This document was prepared using Dragon voice recognition software and may include unintentional dictation errors.    Merwyn Katos, MD 03/19/21 [phone]

## 2021-03-19 NOTE — ED Notes (Signed)
Visual acuity 20/50 bilat

## 2021-04-20 ENCOUNTER — Other Ambulatory Visit: Payer: Self-pay | Admitting: Family Medicine

## 2021-04-20 ENCOUNTER — Other Ambulatory Visit: Payer: Self-pay

## 2021-04-20 ENCOUNTER — Ambulatory Visit (INDEPENDENT_AMBULATORY_CARE_PROVIDER_SITE_OTHER): Payer: BC Managed Care – PPO

## 2021-04-20 ENCOUNTER — Encounter: Payer: Self-pay | Admitting: Family Medicine

## 2021-04-20 DIAGNOSIS — Z3042 Encounter for surveillance of injectable contraceptive: Secondary | ICD-10-CM

## 2021-04-20 MED ORDER — MEDROXYPROGESTERONE ACETATE 150 MG/ML IM SUSP
150.0000 mg | Freq: Once | INTRAMUSCULAR | Status: AC
Start: 2021-04-20 — End: 2021-04-20
  Administered 2021-04-20: 150 mg via INTRAMUSCULAR

## 2021-04-20 NOTE — Progress Notes (Signed)
Pt arrived for Depo Provera injection.  Pt tolerated injection well in left deltoid.  Last Depo Provera Given - 01/28/2021 Pt is within due dates.

## 2021-04-21 NOTE — Telephone Encounter (Signed)
Requested medications are due for refill today yes  Requested medications are on the active medication list yes  Last refill 02/22/21  Last visit 02/13/21  Future visit scheduled 05/25/21  Notes to clinic Not Delegated, please assess.

## 2021-05-06 ENCOUNTER — Encounter: Payer: Self-pay | Admitting: Family Medicine

## 2021-05-12 ENCOUNTER — Other Ambulatory Visit: Payer: Self-pay

## 2021-05-12 ENCOUNTER — Encounter: Payer: Self-pay | Admitting: Family Medicine

## 2021-05-12 ENCOUNTER — Ambulatory Visit (INDEPENDENT_AMBULATORY_CARE_PROVIDER_SITE_OTHER): Payer: BC Managed Care – PPO | Admitting: Family Medicine

## 2021-05-12 VITALS — BP 137/88 | HR 106 | Ht 64.0 in | Wt 206.0 lb

## 2021-05-12 DIAGNOSIS — B9689 Other specified bacterial agents as the cause of diseases classified elsewhere: Secondary | ICD-10-CM | POA: Diagnosis not present

## 2021-05-12 DIAGNOSIS — N760 Acute vaginitis: Secondary | ICD-10-CM | POA: Diagnosis not present

## 2021-05-12 DIAGNOSIS — N898 Other specified noninflammatory disorders of vagina: Secondary | ICD-10-CM | POA: Diagnosis not present

## 2021-05-12 DIAGNOSIS — Z23 Encounter for immunization: Secondary | ICD-10-CM | POA: Diagnosis not present

## 2021-05-12 LAB — WET PREP FOR TRICH, YEAST, CLUE
Clue Cell Exam: POSITIVE — AB
Trichomonas Exam: NEGATIVE
Yeast Exam: NEGATIVE

## 2021-05-12 MED ORDER — METRONIDAZOLE 500 MG PO TABS: 500.0000 mg | ORAL_TABLET | Freq: Two times a day (BID) | ORAL | 0 refills | Status: AC

## 2021-05-12 NOTE — Progress Notes (Signed)
BP 137/88   Pulse (!) 106   Ht 5\' 4"  (1.626 m)   Wt 206 lb (93.4 kg)   BMI 35.36 kg/m    Subjective:    Patient ID: , female    DOB: 06-04-1976, 45 y.o.   MRN: [id number]  HPI: Maria Little is a 45 y.o. female  Chief Complaint  Patient presents with   Vaginal Discharge   VAGINAL DISCHARGE Duration: 2 weeks Discharge description: dark discharge deep inside  Pruritus: no Dysuria: no Malodorous: no Urinary frequency: yes Fevers: no Abdominal pain: no  Sexual activity: practicing safe sex History of sexually transmitted diseases: no Recent antibiotic use: no Context: stable  Treatments attempted: none  Relevant past medical, surgical, family and social history reviewed and updated as indicated. Interim medical history since our last visit reviewed. Allergies and medications reviewed and updated.  Review of Systems  Constitutional: Negative.   Respiratory: Negative.    Cardiovascular: Negative.   Gastrointestinal: Negative.   Musculoskeletal: Negative.   Neurological: Negative.   Psychiatric/Behavioral: Negative.     Per HPI unless specifically indicated above     Objective:    BP 137/88   Pulse (!) 106   Ht 5\' 4"  (1.626 m)   Wt 206 lb (93.4 kg)   BMI 35.36 kg/m   Wt Readings from Last 3 Encounters:  05/12/21 206 lb (93.4 kg)  03/19/21 206 lb (93.4 kg)  02/13/21 202 lb (91.6 kg)    Physical Exam Vitals and nursing note reviewed.  Constitutional:      General: She is not in acute distress.    Appearance: Normal appearance. She is not ill-appearing, toxic-appearing or diaphoretic.  HENT:     Head: Normocephalic and atraumatic.     Right Ear: External ear normal.     Left Ear: External ear normal.     Nose: Nose normal.     Mouth/Throat:     Mouth: Mucous membranes are moist.     Pharynx: Oropharynx is clear.  Eyes:     General: No scleral icterus.       Right eye: No discharge.        Left eye: No discharge.      Extraocular Movements: Extraocular movements intact.     Conjunctiva/sclera: Conjunctivae normal.     Pupils: Pupils are equal, round, and reactive to light.  Cardiovascular:     Rate and Rhythm: Normal rate and regular rhythm.     Pulses: Normal pulses.     Heart sounds: Normal heart sounds. No murmur heard.   No friction rub. No gallop.  Pulmonary:     Effort: Pulmonary effort is normal. No respiratory distress.     Breath sounds: Normal breath sounds. No stridor. No wheezing, rhonchi or rales.  Chest:     Chest wall: No tenderness.  Musculoskeletal:        General: Normal range of motion.     Cervical back: Normal range of motion and neck supple.  Skin:    General: Skin is warm and dry.     Capillary Refill: Capillary refill takes less than 2 seconds.     Coloration: Skin is not jaundiced or pale.     Findings: No bruising, erythema, lesion or rash.  Neurological:     General: No focal deficit present.     Mental Status: She is alert and oriented to person, place, and time. Mental status is at baseline.  Psychiatric:  Mood and Affect: Mood normal.        Behavior: Behavior normal.        Thought Content: Thought content normal.        Judgment: Judgment normal.    Results for orders placed or performed in visit on 02/13/21  Bayer DCA Hb A1c Waived  Result Value Ref Range   HB A1C (BAYER DCA - WAIVED) 7.0 (H) <7.0 %      Assessment & Plan:   Problem List Items Addressed This Visit   None Visit Diagnoses     BV (bacterial vaginosis)    -  Primary   Will treat with flagyl. Call with any concerns or not improving.    Relevant Medications   metroNIDAZOLE (FLAGYL) 500 MG tablet   Vaginal discharge       + clue cells   Relevant Orders   WET PREP FOR TRICH, YEAST, CLUE        Follow up plan: Return As scheduled.

## 2021-05-13 ENCOUNTER — Ambulatory Visit: Payer: BC Managed Care – PPO | Admitting: Family Medicine

## 2021-05-25 ENCOUNTER — Encounter: Payer: Self-pay | Admitting: Family Medicine

## 2021-05-25 ENCOUNTER — Ambulatory Visit (INDEPENDENT_AMBULATORY_CARE_PROVIDER_SITE_OTHER): Payer: BC Managed Care – PPO | Admitting: Family Medicine

## 2021-05-25 ENCOUNTER — Other Ambulatory Visit: Payer: Self-pay

## 2021-05-25 VITALS — BP 127/84 | HR 92 | Temp 98.7°F | Ht 64.5 in | Wt 195.0 lb

## 2021-05-25 DIAGNOSIS — F331 Major depressive disorder, recurrent, moderate: Secondary | ICD-10-CM

## 2021-05-25 DIAGNOSIS — E782 Mixed hyperlipidemia: Secondary | ICD-10-CM

## 2021-05-25 DIAGNOSIS — Z0000 Encounter for general adult medical examination without abnormal findings: Secondary | ICD-10-CM | POA: Diagnosis not present

## 2021-05-25 DIAGNOSIS — R202 Paresthesia of skin: Secondary | ICD-10-CM

## 2021-05-25 DIAGNOSIS — R200 Anesthesia of skin: Secondary | ICD-10-CM

## 2021-05-25 DIAGNOSIS — E1165 Type 2 diabetes mellitus with hyperglycemia: Secondary | ICD-10-CM | POA: Diagnosis not present

## 2021-05-25 DIAGNOSIS — K219 Gastro-esophageal reflux disease without esophagitis: Secondary | ICD-10-CM

## 2021-05-25 DIAGNOSIS — Z1231 Encounter for screening mammogram for malignant neoplasm of breast: Secondary | ICD-10-CM

## 2021-05-25 LAB — URINALYSIS, ROUTINE W REFLEX MICROSCOPIC
Bilirubin, UA: NEGATIVE
Glucose, UA: NEGATIVE
Ketones, UA: NEGATIVE
Leukocytes,UA: NEGATIVE
Nitrite, UA: NEGATIVE
Protein,UA: NEGATIVE
RBC, UA: NEGATIVE
Specific Gravity, UA: 1.015 (ref 1.005–1.030)
Urobilinogen, Ur: 0.2 mg/dL (ref 0.2–1.0)
pH, UA: 7 (ref 5.0–7.5)

## 2021-05-25 LAB — BAYER DCA HB A1C WAIVED: HB A1C (BAYER DCA - WAIVED): 6.9 % — ABNORMAL HIGH (ref 4.8–5.6)

## 2021-05-25 LAB — MICROALBUMIN, URINE WAIVED
Creatinine, Urine Waived: 100 mg/dL (ref 10–300)
Microalb, Ur Waived: 30 mg/L — ABNORMAL HIGH (ref 0–19)
Microalb/Creat Ratio: <30 mg/g (ref <30)

## 2021-05-25 NOTE — Assessment & Plan Note (Signed)
Stable with A1c of 6.9.  Continue current regimen. Continue to monitor. Call with any concerns. Refills given.

## 2021-05-25 NOTE — Progress Notes (Signed)
BP 127/84   Pulse 92   Temp 98.7 F (37.1 C)   Ht 5' 4.5" (1.638 m)   Wt 195 lb (88.5 kg)   SpO2 98%   BMI 32.95 kg/m    Subjective:    Patient ID: Maria Little, female    DOB: 09-May-1976, 45 y.o.   MRN: [id number]  HPI: Maria Little is a 45 y.o. female presenting on 05/25/2021 for comprehensive medical examination. Current medical complaints include:  NUMBNESS Duration: 3 weeks Onset: sudden Location: first 4 fingers Bilateral: no Symmetric: no Decreased sensation: yes  Weakness: no Pain: no Quality:  numb and tingling Severity: severe  Frequency: constant Trauma: no Recent illness: no Diabetes: yes Thyroid disease: no  HIV: no  Alcoholism: no  Spinal cord injury: no Status: worse  DIABETES Hypoglycemic episodes:no Polydipsia/polyuria: yes Visual disturbance: no Chest pain: no Paresthesias: yes Glucose Monitoring: yes  Accucheck frequency: Daily- 129 this AM Taking Insulin?: no Blood Pressure Monitoring: not checking Retinal Examination: Up to Date Foot Exam: Up to Date Diabetic Education: Completed Pneumovax: Up to Date Influenza: Up to Date Aspirin: no  HYPERLIPIDEMIA Hyperlipidemia status: stable Satisfied with current treatment?  yes Medication compliance: not on anything Past cholesterol meds: none Supplements: none Aspirin:  no The 10-year ASCVD risk score (Arnett DK, et al., 2019) is: 3.6%   Values used to calculate the score:     Age: 63 years     Sex: Female     Is Non-Hispanic African American: Yes     Diabetic: Yes     Tobacco smoker: No     Systolic Blood Pressure: 127 mmHg     Is BP treated: No     HDL Cholesterol: 48 mg/dL     Total Cholesterol: 253 mg/dL Chest pain:  no  DEPRESSION- has been feelng jittery on the higher 1/2 of pill Mood status: worse Satisfied with current treatment?: no Symptom severity: mild  Duration of current treatment : chronic Side effects: yes- jittery Medication compliance: excellent  compliance Psychotherapy/counseling: no  Previous psychiatric medications: sertrline Depressed mood: yes Anxious mood: yes Anhedonia: no Significant weight loss or gain: no Insomnia: yes hard to fall asleep Fatigue: yes Feelings of worthlessness or guilt: no Impaired concentration/indecisiveness: no Suicidal ideations: no Hopelessness: no Crying spells: no Depression screen [facility] 2/9 02/13/2021 10/15/2020 09/03/2020 11/19/2019 10/31/2019  Decreased Interest 1 0 0 0 0  Down, Depressed, Hopeless 1 1 1  0 2  PHQ - 2 Score 2 1 1  0 2  Altered sleeping 3 3 3 3 3   Tired, decreased energy 0 0 0 0 0  Change in appetite 3 3 1  0 1  Feeling bad or failure about yourself  0 0 3 3 2   Trouble concentrating 0 1 0 0 0  Moving slowly or fidgety/restless 0 0 0 0 0  Suicidal thoughts 0 1 0 0 0  PHQ-9 Score 8 9 8 6 8   Difficult doing work/chores Somewhat difficult Somewhat difficult Somewhat difficult Somewhat difficult Somewhat difficult  Some recent data might be hidden   Menopausal Symptoms: no  Depression Screen done today and results listed below:  Depression screen [facility] 2/9 02/13/2021 10/15/2020 09/03/2020 11/19/2019 10/31/2019  Decreased Interest 1 0 0 0 0  Down, Depressed, Hopeless 1 1 1  0 2  PHQ - 2 Score 2 1 1  0 2  Altered sleeping 3 3 3 3 3   Tired, decreased energy 0 0 0 0 0  Change in appetite 3 3 1  0 1  Feeling bad or failure about yourself  0 0 3 3 2   Trouble concentrating 0 1 0 0 0  Moving slowly or fidgety/restless 0 0 0 0 0  Suicidal thoughts 0 1 0 0 0  PHQ-9 Score 8 9 8 6 8   Difficult doing work/chores Somewhat difficult Somewhat difficult Somewhat difficult Somewhat difficult Somewhat difficult  Some recent data might be hidden    Past Medical History:  Past Medical History:  Diagnosis Date   Diabetes mellitus without complication (HCC)    Insomnia    Migraine     Surgical History:  Past Surgical History:  Procedure Laterality Date   BREAST SURGERY     Lumpectomy     Medications:  Current Outpatient Medications on File Prior to Visit  Medication Sig   medroxyPROGESTERone (DEPO-PROVERA) 150 MG/ML injection Inject 150 mg into the muscle every 3 (three) months.   metFORMIN (GLUCOPHAGE-XR) 500 MG 24 hr tablet Take 2 tablets (1,000 mg total) by mouth daily with breakfast.   omeprazole (PRILOSEC) 20 MG capsule Take 1 capsule (20 mg total) by mouth daily.   sertraline (ZOLOFT) 100 MG tablet Take 1.5 tablets (150 mg total) by mouth daily.   zolpidem (AMBIEN) 5 MG tablet TAKE 1 TABLET BY MOUTH AT BEDTIME AS NEEDED FOR SLEEP   No current facility-administered medications on file prior to visit.    Allergies:  No Known Allergies  Social History:  Social History   Socioeconomic History   Marital status: Single    Spouse name: Not on file   Number of children: Not on file   Years of education: Not on file   Highest education level: Not on file  Occupational History   Not on file  Tobacco Use   Smoking status: Never   Smokeless tobacco: Never  Vaping Use   Vaping Use: Never used  Substance and Sexual Activity   Alcohol use: Not Currently    Comment: none as of 07/20/20 per patient   Drug use: No   Sexual activity: Never  Other Topics Concern   Not on file  Social History Narrative   Not on file   Social Determinants of Health   Financial Resource Strain: Not on file  Food Insecurity: Not on file  Transportation Needs: Not on file  Physical Activity: Not on file  Stress: Not on file  Social Connections: Not on file  Intimate Partner Violence: Not on file   Social History   Tobacco Use  Smoking Status Never  Smokeless Tobacco Never   Social History   Substance and Sexual Activity  Alcohol Use Not Currently   Comment: none as of 07/20/20 per patient    Family History:  Family History  Problem Relation Age of Onset   Diabetes Mother    Hyperlipidemia Mother    Hypertension Mother    Hypertension Father    Hypertension  Brother     Past medical history, surgical history, medications, allergies, family history and social history reviewed with patient today and changes made to appropriate areas of the chart.   Review of Systems  Constitutional: Negative.   HENT: Negative.    Eyes: Negative.   Respiratory: Negative.    Cardiovascular: Negative.   Gastrointestinal: Negative.   Genitourinary: Negative.   Musculoskeletal: Negative.   Skin: Negative.   Neurological:  Positive for tingling and sensory change. Negative for dizziness, tremors, speech change, focal weakness, seizures, loss of consciousness, weakness and headaches.  Endo/Heme/Allergies: Negative.  Psychiatric/Behavioral: Negative.    All other ROS negative except what is listed above and in the HPI.      Objective:    BP 127/84   Pulse 92   Temp 98.7 F (37.1 C)   Ht 5' 4.5" (1.638 m)   Wt 195 lb (88.5 kg)   SpO2 98%   BMI 32.95 kg/m   Wt Readings from Last 3 Encounters:  05/25/21 195 lb (88.5 kg)  05/12/21 206 lb (93.4 kg)  03/19/21 206 lb (93.4 kg)    Physical Exam Vitals and nursing note reviewed.  Constitutional:      General: She is not in acute distress.    Appearance: Normal appearance. She is not ill-appearing, toxic-appearing or diaphoretic.  HENT:     Head: Normocephalic and atraumatic.     Right Ear: Tympanic membrane, ear canal and external ear normal. There is no impacted cerumen.     Left Ear: Tympanic membrane, ear canal and external ear normal. There is no impacted cerumen.     Nose: Nose normal. No congestion or rhinorrhea.     Mouth/Throat:     Mouth: Mucous membranes are moist.     Pharynx: Oropharynx is clear. No oropharyngeal exudate or posterior oropharyngeal erythema.  Eyes:     General: No scleral icterus.       Right eye: No discharge.        Left eye: No discharge.     Extraocular Movements: Extraocular movements intact.     Conjunctiva/sclera: Conjunctivae normal.     Pupils: Pupils are equal,  round, and reactive to light.  Neck:     Vascular: No carotid bruit.  Cardiovascular:     Rate and Rhythm: Normal rate and regular rhythm.     Pulses: Normal pulses.     Heart sounds: No murmur heard.   No friction rub. No gallop.  Pulmonary:     Effort: Pulmonary effort is normal. No respiratory distress.     Breath sounds: Normal breath sounds. No stridor. No wheezing, rhonchi or rales.  Chest:     Chest wall: No tenderness.  Abdominal:     General: Abdomen is flat. Bowel sounds are normal. There is no distension.     Palpations: Abdomen is soft. There is no mass.     Tenderness: There is no abdominal tenderness. There is no right CVA tenderness, left CVA tenderness, guarding or rebound.     Hernia: No hernia is present.  Genitourinary:    Comments: Breast and pelvic exams deferred with shared decision making Musculoskeletal:        General: No swelling, tenderness, deformity or signs of injury.     Cervical back: Normal range of motion and neck supple. No rigidity. No muscular tenderness.     Right lower leg: No edema.     Left lower leg: No edema.  Lymphadenopathy:     Cervical: No cervical adenopathy.  Skin:    General: Skin is warm and dry.     Capillary Refill: Capillary refill takes less than 2 seconds.     Coloration: Skin is not jaundiced or pale.     Findings: No bruising, erythema, lesion or rash.  Neurological:     General: No focal deficit present.     Mental Status: She is alert and oriented to person, place, and time. Mental status is at baseline.     Cranial Nerves: No cranial nerve deficit.     Sensory: No sensory deficit.  Motor: No weakness.     Coordination: Coordination normal.     Gait: Gait normal.     Deep Tendon Reflexes: Reflexes normal.  Psychiatric:        Mood and Affect: Mood normal.        Behavior: Behavior normal.        Thought Content: Thought content normal.        Judgment: Judgment normal.    Results for orders placed or  performed in visit on 05/12/21  WET PREP FOR TRICH, YEAST, CLUE   Specimen: Sterile Swab   Sterile Swab  Result Value Ref Range   Trichomonas Exam Negative Negative   Yeast Exam Negative Negative   Clue Cell Exam Positive (A) Negative      Assessment & Plan:   Problem List Items Addressed This Visit       Digestive   Gastroesophageal reflux disease    Under good control on current regimen. Continue current regimen. Continue to monitor. Call with any concerns. Refills given. Labs drawn today.       Relevant Orders   CBC with Differential/Platelet   Comprehensive metabolic panel     Endocrine   Type 2 diabetes mellitus with hyperglycemia, without long-term current use of insulin (HCC)    Stable with A1c of 6.9.  Continue current regimen. Continue to monitor. Call with any concerns. Refills given.      Relevant Orders   Bayer DCA Hb A1c Waived   CBC with Differential/Platelet   Comprehensive metabolic panel   Urinalysis, Routine w reflex microscopic   Microalbumin, Urine Waived     Other   Depression, major, recurrent, moderate (HCC)    Feeling jittery on 150mg  of sertraline. Will go back to 100mg  and recheck 3 months. Call with any concerns.      Relevant Orders   CBC with Differential/Platelet   Comprehensive metabolic panel   TSH   Hyperlipidemia    Rechecking labs today. Await results. Treat as needed.      Relevant Orders   CBC with Differential/Platelet   Comprehensive metabolic panel   Lipid Panel w/o Chol/HDL Ratio   Other Visit Diagnoses     Routine general medical examination at a health care facility    -  Primary   Vaccines up to date. Screening labs checked today. mammogram ordered. Continue diet and exercise. Call with any concerns.    Numbness and tingling in right hand       Stretches given. Checking TSH. Offered referrals to neuro/ortho, which she declined. Continue to monitor.    Encounter for screening mammogram for malignant neoplasm of  breast       Mammogram ordered today.   Relevant Orders   MM 3D SCREEN BREAST BILATERAL        Follow up plan: Return in about 3 months (around 08/25/2021).   LABORATORY TESTING:  - Pap smear: up to date  IMMUNIZATIONS:   - Tdap: Tetanus vaccination status reviewed: last tetanus booster within 10 years. - Influenza: Up to date - Pneumovax: Up to date - Prevnar: Not applicable - HPV: Up to date - Zostavax vaccine: Not applicable  SCREENING: -Mammogram: Ordered today  - Colonoscopy: Not applicable   PATIENT COUNSELING:   Advised to take 1 mg of folate supplement per day if capable of pregnancy.   Sexuality: Discussed sexually transmitted diseases, partner selection, use of condoms, avoidance of unintended pregnancy  and contraceptive alternatives.   Advised to avoid cigarette smoking.  I discussed  with the patient that most people either abstain from alcohol or drink within safe limits (<=14/week and <=4 drinks/occasion for males, <=7/weeks and <= 3 drinks/occasion for females) and that the risk for alcohol disorders and other health effects rises proportionally with the number of drinks per week and how often a drinker exceeds daily limits.  Discussed cessation/primary prevention of drug use and availability of treatment for abuse.   Diet: Encouraged to adjust caloric intake to maintain  or achieve ideal body weight, to reduce intake of dietary saturated fat and total fat, to limit sodium intake by avoiding high sodium foods and not adding table salt, and to maintain adequate dietary potassium and calcium preferably from fresh fruits, vegetables, and low-fat dairy products.    stressed the importance of regular exercise  Injury prevention: Discussed safety belts, safety helmets, smoke detector, smoking near bedding or upholstery.   Dental health: Discussed importance of regular tooth brushing, flossing, and dental visits.    NEXT PREVENTATIVE PHYSICAL DUE IN 1  YEAR. Return in about 3 months (around 08/25/2021).

## 2021-05-25 NOTE — Assessment & Plan Note (Signed)
Under good control on current regimen. Continue current regimen. Continue to monitor. Call with any concerns. Refills given. Labs drawn today.   

## 2021-05-25 NOTE — Patient Instructions (Signed)
Call to schedule your mammogram: [facility]  Address: [address], [place], [place] [address]  Phone: [phone]  

## 2021-05-25 NOTE — Assessment & Plan Note (Signed)
Feeling jittery on 150mg  of sertraline. Will go back to 100mg  and recheck 3 months. Call with any concerns.

## 2021-05-25 NOTE — Assessment & Plan Note (Signed)
Rechecking labs today. Await results. Treat as needed.  °

## 2021-05-26 LAB — LIPID PANEL W/O CHOL/HDL RATIO
Cholesterol, Total: 259 mg/dL — ABNORMAL HIGH (ref 100–199)
HDL: 54 mg/dL (ref >39)
LDL Chol Calc (NIH): 184 mg/dL — ABNORMAL HIGH (ref 0–99)
Triglycerides: 116 mg/dL (ref 0–149)
VLDL Cholesterol Cal: 21 mg/dL (ref 5–40)

## 2021-05-26 LAB — CBC WITH DIFFERENTIAL/PLATELET
Basophils Absolute: 0.1 10*3/uL (ref 0.0–0.2)
Basos: 1 %
EOS (ABSOLUTE): 0.1 10*3/uL (ref 0.0–0.4)
Eos: 1 %
Hematocrit: 40.1 % (ref 34.0–46.6)
Hemoglobin: 12.8 g/dL (ref 11.1–15.9)
Immature Grans (Abs): 0 10*3/uL (ref 0.0–0.1)
Immature Granulocytes: 0 %
Lymphocytes Absolute: 2.9 10*3/uL (ref 0.7–3.1)
Lymphs: 36 %
MCH: 25.5 pg — ABNORMAL LOW (ref 26.6–33.0)
MCHC: 31.9 g/dL (ref 31.5–35.7)
MCV: 80 fL (ref 79–97)
Monocytes Absolute: 0.7 10*3/uL (ref 0.1–0.9)
Monocytes: 8 %
Neutrophils Absolute: 4.4 10*3/uL (ref 1.4–7.0)
Neutrophils: 54 %
Platelets: 332 10*3/uL (ref 150–450)
RBC: 5.02 x10E6/uL (ref 3.77–5.28)
RDW: 14.5 % (ref 11.7–15.4)
WBC: 8.1 10*3/uL (ref 3.4–10.8)

## 2021-05-26 LAB — TSH: TSH: 1.18 u[IU]/mL (ref 0.450–4.500)

## 2021-05-26 LAB — COMPREHENSIVE METABOLIC PANEL
ALT: 15 IU/L (ref 0–32)
AST: 19 IU/L (ref 0–40)
Albumin/Globulin Ratio: 1.4 (ref 1.2–2.2)
Albumin: 4.2 g/dL (ref 3.8–4.8)
Alkaline Phosphatase: 80 IU/L (ref 44–121)
BUN/Creatinine Ratio: 10 (ref 9–23)
BUN: 7 mg/dL (ref 6–24)
Bilirubin Total: 0.5 mg/dL (ref 0.0–1.2)
CO2: 21 mmol/L (ref 20–29)
Calcium: 9 mg/dL (ref 8.7–10.2)
Chloride: 101 mmol/L (ref 96–106)
Creatinine, Ser: 0.72 mg/dL (ref 0.57–1.00)
Globulin, Total: 2.9 g/dL (ref 1.5–4.5)
Glucose: 144 mg/dL — ABNORMAL HIGH (ref 70–99)
Potassium: 4.2 mmol/L (ref 3.5–5.2)
Sodium: 137 mmol/L (ref 134–144)
Total Protein: 7.1 g/dL (ref 6.0–8.5)
eGFR: 106 mL/min/{1.73_m2} (ref >59)

## 2021-06-01 MED ORDER — OMEPRAZOLE 20 MG PO CPDR: 20.0000 mg | DELAYED_RELEASE_CAPSULE | Freq: Every day | ORAL | 3 refills | Status: DC

## 2021-06-01 NOTE — Telephone Encounter (Signed)
Her sertraline should not be due until January

## 2021-06-14 ENCOUNTER — Other Ambulatory Visit: Payer: Self-pay | Admitting: Family Medicine

## 2021-06-15 NOTE — Telephone Encounter (Signed)
Requested medication (s) are due for refill today:   No for Zoloft;  provider to review Ambien  Requested medication (s) are on the active medication list:   Yes  Future visit scheduled:   Yes 08/26/2021 with Laural Benes   Last ordered: Zoloft 02/13/2021 #135, 1 refill;     Ambien 04/21/2021 #30, 0 refill -non delegated refill   Requested Prescriptions  Pending Prescriptions Disp Refills   sertraline (ZOLOFT) 100 MG tablet [Pharmacy Med Name: Sertraline HCl 100 MG Oral Tablet] 135 tablet 0    Sig: TAKE ONE AND ONE-HALF TABLETS BY MOUTH ONCE DAILY     Psychiatry:  Antidepressants - SSRI Passed - 06/14/2021  6:30 AM      Passed - Completed PHQ-2 or PHQ-9 in the last 360 days      Passed - Valid encounter within last 6 months    Recent Outpatient Visits           3 weeks ago Routine general medical examination at a health care facility   [facility], Megan P, DO   1 month ago BV (bacterial vaginosis)   [facility], Megan P, DO   4 months ago Type 2 diabetes mellitus with hyperglycemia, without long-term current use of insulin (HCC)   [facility], Megan P, DO   8 months ago Type 2 diabetes mellitus with hyperglycemia, without long-term current use of insulin (HCC)   [facility], Megan P, DO   9 months ago Type 2 diabetes mellitus with hyperglycemia, without long-term current use of insulin (HCC)   [facility] [place], [place], DO       Future Appointments             In 2 months Johnson, Megan P, DO [facility]             zolpidem (AMBIEN) 5 MG tablet [employer] Med Name: Zolpidem Tartrate 5 MG Oral Tablet] 30 tablet 0    Sig: TAKE 1 TABLET BY MOUTH AT BEDTIME AS NEEDED FOR SLEEP     Not Delegated - Psychiatry:  Anxiolytics/Hypnotics Failed - 06/14/2021  6:30 AM      Failed - This refill cannot be delegated      Failed - Urine Drug Screen completed in last 360  days      Passed - Valid encounter within last 6 months    Recent Outpatient Visits           3 weeks ago Routine general medical examination at a health care facility   [facility], Megan P, DO   1 month ago BV (bacterial vaginosis)   [facility], Megan P, DO   4 months ago Type 2 diabetes mellitus with hyperglycemia, without long-term current use of insulin (HCC)   [facility] [place], Megan P, DO   8 months ago Type 2 diabetes mellitus with hyperglycemia, without long-term current use of insulin (HCC)   [facility] [place], Megan P, DO   9 months ago Type 2 diabetes mellitus with hyperglycemia, without long-term current use of insulin [facility])   [facility] [place], Pring[place], DO       Future Appointments             In 2 months Johnson, Oralia Rud, DO [facility], [facility]

## 2021-06-21 ENCOUNTER — Encounter: Payer: Self-pay | Admitting: Family Medicine

## 2021-06-22 ENCOUNTER — Encounter: Payer: Self-pay | Admitting: Family Medicine

## 2021-06-22 ENCOUNTER — Other Ambulatory Visit: Payer: Self-pay

## 2021-06-22 ENCOUNTER — Ambulatory Visit (INDEPENDENT_AMBULATORY_CARE_PROVIDER_SITE_OTHER): Payer: BC Managed Care – PPO | Admitting: Family Medicine

## 2021-06-22 VITALS — BP 118/77 | HR 93 | Temp 98.4°F | Wt 197.4 lb

## 2021-06-22 DIAGNOSIS — J069 Acute upper respiratory infection, unspecified: Secondary | ICD-10-CM

## 2021-06-22 MED ORDER — BENZONATATE 200 MG PO CAPS: 200.0000 mg | ORAL_CAPSULE | Freq: Two times a day (BID) | ORAL | 0 refills | Status: DC | PRN

## 2021-06-22 MED ORDER — HYDROCOD POLST-CPM POLST ER 10-8 MG/5ML PO SUER: 5.0000 mL | Freq: Two times a day (BID) | ORAL | 0 refills | Status: DC | PRN

## 2021-06-22 MED ORDER — PREDNISONE 50 MG PO TABS: 50.0000 mg | ORAL_TABLET | Freq: Every day | ORAL | 0 refills | Status: DC

## 2021-06-22 NOTE — Progress Notes (Signed)
BP 118/77   Pulse 93   Temp 98.4 F (36.9 C)   Wt 197 lb 6.4 oz (89.5 kg)   SpO2 99%   BMI 33.36 kg/m    Subjective:    Patient ID: Maria Little, female    DOB: 28-Apr-1976, 45 y.o.   MRN: [id number]  HPI: Maria Little is a 45 y.o. female  Chief Complaint  Patient presents with   URI    Patient states she is having cough, runny nose, sneezing and congestion. Symptoms began Friday. Patient states she ran a fever on Saturday.    UPPER RESPIRATORY TRACT INFECTION Duration: 3-4 days Worst symptom: stuffy nose, runny nose Fever: yes Cough: yes Shortness of breath: no Wheezing: no Chest pain: no Chest tightness: no Chest congestion: no Nasal congestion: yes Runny nose: yes Post nasal drip: yes Sneezing: yes Sore throat: yes Swollen glands: no Sinus pressure: no Headache: yes Face pain: no Toothache: no Ear pain: no  Ear pressure: no  Eyes red/itching:no Eye drainage/crusting: no  Vomiting: no Rash: no Fatigue: yes Sick contacts: no Strep contacts: no  Context: worse Recurrent sinusitis: no Relief with OTC cold/cough medications: no  Treatments attempted: cold/sinus and mucinex    Relevant past medical, surgical, family and social history reviewed and updated as indicated. Interim medical history since our last visit reviewed. Allergies and medications reviewed and updated.  Review of Systems  Constitutional:  Positive for chills, diaphoresis, fatigue and fever. Negative for activity change, appetite change and unexpected weight change.  HENT:  Positive for congestion, postnasal drip, rhinorrhea, sneezing and sore throat. Negative for dental problem, drooling, ear discharge, ear pain, facial swelling, hearing loss, mouth sores, nosebleeds, sinus pressure, sinus pain, tinnitus, trouble swallowing and voice change.   Eyes: Negative.   Respiratory:  Positive for cough. Negative for apnea, choking, chest tightness, shortness of breath, wheezing and  stridor.   Cardiovascular: Negative.   Gastrointestinal: Negative.   Psychiatric/Behavioral: Negative.     Per HPI unless specifically indicated above     Objective:    BP 118/77   Pulse 93   Temp 98.4 F (36.9 C)   Wt 197 lb 6.4 oz (89.5 kg)   SpO2 99%   BMI 33.36 kg/m   Wt Readings from Last 3 Encounters:  06/22/21 197 lb 6.4 oz (89.5 kg)  05/25/21 195 lb (88.5 kg)  05/12/21 206 lb (93.4 kg)    Physical Exam Vitals and nursing note reviewed.  Constitutional:      General: She is not in acute distress.    Appearance: Normal appearance. She is not ill-appearing, toxic-appearing or diaphoretic.  HENT:     Head: Normocephalic and atraumatic.     Right Ear: Tympanic membrane, ear canal and external ear normal. There is no impacted cerumen.     Left Ear: Tympanic membrane, ear canal and external ear normal. There is no impacted cerumen.     Nose: Rhinorrhea present.     Mouth/Throat:     Mouth: Mucous membranes are moist.     Pharynx: Oropharynx is clear. No oropharyngeal exudate or posterior oropharyngeal erythema.  Eyes:     General: No scleral icterus.       Right eye: No discharge.        Left eye: No discharge.     Extraocular Movements: Extraocular movements intact.     Conjunctiva/sclera: Conjunctivae normal.     Pupils: Pupils are equal, round, and reactive to light.  Neck:  Vascular: No carotid bruit.  Cardiovascular:     Rate and Rhythm: Normal rate and regular rhythm.     Pulses: Normal pulses.     Heart sounds: Normal heart sounds. No murmur heard.   No friction rub. No gallop.  Pulmonary:     Effort: Pulmonary effort is normal. No respiratory distress.     Breath sounds: Normal breath sounds. No stridor. No wheezing, rhonchi or rales.  Chest:     Chest wall: No tenderness.  Musculoskeletal:        General: Normal range of motion.     Cervical back: Normal range of motion and neck supple. No rigidity. No muscular tenderness.  Lymphadenopathy:      Cervical: No cervical adenopathy.  Skin:    General: Skin is warm and dry.     Capillary Refill: Capillary refill takes less than 2 seconds.     Coloration: Skin is not jaundiced or pale.     Findings: No bruising, erythema, lesion or rash.  Neurological:     General: No focal deficit present.     Mental Status: She is alert and oriented to person, place, and time. Mental status is at baseline.     Cranial Nerves: No cranial nerve deficit.     Sensory: No sensory deficit.     Motor: No weakness.     Coordination: Coordination normal.     Gait: Gait normal.     Deep Tendon Reflexes: Reflexes normal.  Psychiatric:        Mood and Affect: Mood normal.        Behavior: Behavior normal.        Thought Content: Thought content normal.        Judgment: Judgment normal.    Results for orders placed or performed in visit on 05/25/21  Bayer DCA Hb A1c Waived  Result Value Ref Range   HB A1C (BAYER DCA - WAIVED) 6.9 (H) 4.8 - 5.6 %  CBC with Differential/Platelet  Result Value Ref Range   WBC 8.1 3.4 - 10.8 x10E3/uL   RBC 5.02 3.77 - 5.28 x10E6/uL   Hemoglobin 12.8 11.1 - 15.9 g/dL   Hematocrit 40.1 34.0 - 46.6 %   MCV 80 79 - 97 fL   MCH 25.5 (L) 26.6 - 33.0 pg   MCHC 31.9 31.5 - 35.7 g/dL   RDW 14.5 11.7 - 15.4 %   Platelets 332 150 - 450 x10E3/uL   Neutrophils 54 Not Estab. %   Lymphs 36 Not Estab. %   Monocytes 8 Not Estab. %   Eos 1 Not Estab. %   Basos 1 Not Estab. %   Neutrophils Absolute 4.4 1.4 - 7.0 x10E3/uL   Lymphocytes Absolute 2.9 0.7 - 3.1 x10E3/uL   Monocytes Absolute 0.7 0.1 - 0.9 x10E3/uL   EOS (ABSOLUTE) 0.1 0.0 - 0.4 x10E3/uL   Basophils Absolute 0.1 0.0 - 0.2 x10E3/uL   Immature Granulocytes 0 Not Estab. %   Immature Grans (Abs) 0.0 0.0 - 0.1 x10E3/uL  Comprehensive metabolic panel  Result Value Ref Range   Glucose 144 (H) 70 - 99 mg/dL   BUN 7 6 - 24 mg/dL   Creatinine, Ser 0.72 0.57 - 1.00 mg/dL   eGFR 106 >59 mL/min/1.73   BUN/Creatinine Ratio 10 9  - 23   Sodium 137 134 - 144 mmol/L   Potassium 4.2 3.5 - 5.2 mmol/L   Chloride 101 96 - 106 mmol/L   CO2 21 20 - 29 mmol/L  Calcium 9.0 8.7 - 10.2 mg/dL   Total Protein 7.1 6.0 - 8.5 g/dL   Albumin 4.2 3.8 - 4.8 g/dL   Globulin, Total 2.9 1.5 - 4.5 g/dL   Albumin/Globulin Ratio 1.4 1.2 - 2.2   Bilirubin Total 0.5 0.0 - 1.2 mg/dL   Alkaline Phosphatase 80 44 - 121 IU/L   AST 19 0 - 40 IU/L   ALT 15 0 - 32 IU/L  Lipid Panel w/o Chol/HDL Ratio  Result Value Ref Range   Cholesterol, Total 259 (H) 100 - 199 mg/dL   Triglycerides 116 0 - 149 mg/dL   HDL 54 >39 mg/dL   VLDL Cholesterol Cal 21 5 - 40 mg/dL   LDL Chol Calc (NIH) 184 (H) 0 - 99 mg/dL  Urinalysis, Routine w reflex microscopic  Result Value Ref Range   Specific Gravity, UA 1.015 1.005 - 1.030   pH, UA 7.0 5.0 - 7.5   Color, UA Yellow Yellow   Appearance Ur Clear Clear   Leukocytes,UA Negative Negative   Protein,UA Negative Negative/Trace   Glucose, UA Negative Negative   Ketones, UA Negative Negative   RBC, UA Negative Negative   Bilirubin, UA Negative Negative   Urobilinogen, Ur 0.2 0.2 - 1.0 mg/dL   Nitrite, UA Negative Negative  TSH  Result Value Ref Range   TSH 1.180 0.450 - 4.500 uIU/mL  Microalbumin, Urine Waived  Result Value Ref Range   Microalb, Ur Waived 30 (H) 0 - 19 mg/L   Creatinine, Urine Waived 100 10 - 300 mg/dL   Microalb/Creat Ratio <30 <30 mg/g      Assessment & Little:   Problem List Items Addressed This Visit   None Visit Diagnoses     Upper respiratory tract infection, unspecified type    -  Primary   Flu and strep negative. Await COVID. Treat with prednisone, tussionex and tessalon. Call with any concerns. Conitnue ot monitor.    Relevant Orders   Veritor Flu A/B Waived   Rapid Strep Screen ([facility] ONLY)   Novel Coronavirus, NAA ([facility])        Follow up Little: Return if symptoms worsen or fail to improve.

## 2021-06-22 NOTE — Telephone Encounter (Signed)
I have appts at 10:40 and 3:20- please get her into one of these.

## 2021-06-22 NOTE — Telephone Encounter (Signed)
Patient scheduled into today's 10:40am slot.

## 2021-06-23 ENCOUNTER — Other Ambulatory Visit: Payer: Self-pay

## 2021-06-23 LAB — NOVEL CORONAVIRUS, NAA: SARS-CoV-2, NAA: NOT DETECTED

## 2021-06-23 MED ORDER — HYDROCOD POLST-CPM POLST ER 10-8 MG/5ML PO SUER: 5.0000 mL | Freq: Two times a day (BID) | ORAL | 0 refills | Status: DC | PRN

## 2021-06-23 NOTE — Telephone Encounter (Signed)
Prescription has been sent to [facility] on [address] in [place].

## 2021-06-23 NOTE — Telephone Encounter (Signed)
Medication is out of stock at [facility] on [address]. Patient is aware that [facility] on [address] has it in stock and would like it sent there.

## 2021-06-23 NOTE — Telephone Encounter (Signed)
Pt is at [facility] on [address] and needs new Rx sent there for chlorpheniramine-HYDROcodone Maria Little [name]

## 2021-06-24 ENCOUNTER — Ambulatory Visit: Payer: BC Managed Care – PPO | Admitting: Family Medicine

## 2021-06-24 ENCOUNTER — Encounter: Payer: Self-pay | Admitting: Family Medicine

## 2021-06-24 MED ORDER — DOXYCYCLINE HYCLATE 100 MG PO TABS: 100.0000 mg | ORAL_TABLET | Freq: Two times a day (BID) | ORAL | 0 refills | Status: DC

## 2021-06-25 ENCOUNTER — Encounter: Payer: Self-pay | Admitting: Family Medicine

## 2021-06-26 ENCOUNTER — Other Ambulatory Visit: Payer: Self-pay | Admitting: Family Medicine

## 2021-06-26 ENCOUNTER — Encounter: Payer: Self-pay | Admitting: Family Medicine

## 2021-06-26 LAB — VERITOR FLU A/B WAIVED
Influenza A: NEGATIVE
Influenza B: NEGATIVE

## 2021-06-26 LAB — CULTURE, GROUP A STREP

## 2021-06-26 LAB — RAPID STREP SCREEN (MED CTR MEBANE ONLY): Strep Gp A Ag, IA W/Reflex: NEGATIVE

## 2021-06-26 MED ORDER — AMOXICILLIN-POT CLAVULANATE 875-125 MG PO TABS: 1.0000 | ORAL_TABLET | Freq: Two times a day (BID) | ORAL | 0 refills | Status: DC

## 2021-06-29 ENCOUNTER — Encounter: Payer: Self-pay | Admitting: Family Medicine

## 2021-07-08 ENCOUNTER — Other Ambulatory Visit: Payer: Self-pay

## 2021-07-08 ENCOUNTER — Encounter: Payer: Self-pay | Admitting: Family Medicine

## 2021-07-08 ENCOUNTER — Ambulatory Visit (INDEPENDENT_AMBULATORY_CARE_PROVIDER_SITE_OTHER): Payer: BC Managed Care – PPO

## 2021-07-08 ENCOUNTER — Ambulatory Visit: Payer: BC Managed Care – PPO

## 2021-07-08 DIAGNOSIS — Z3042 Encounter for surveillance of injectable contraceptive: Secondary | ICD-10-CM

## 2021-07-08 MED ORDER — MEDROXYPROGESTERONE ACETATE 150 MG/ML IM SUSP
150.0000 mg | Freq: Once | INTRAMUSCULAR | Status: AC
  Administered 2021-07-08: 150 mg via INTRAMUSCULAR

## 2021-08-02 ENCOUNTER — Encounter: Payer: Self-pay | Admitting: Family Medicine

## 2021-08-12 DIAGNOSIS — G514 Facial myokymia: Secondary | ICD-10-CM | POA: Diagnosis not present

## 2021-08-12 DIAGNOSIS — H524 Presbyopia: Secondary | ICD-10-CM | POA: Diagnosis not present

## 2021-08-12 DIAGNOSIS — H40003 Preglaucoma, unspecified, bilateral: Secondary | ICD-10-CM | POA: Diagnosis not present

## 2021-08-12 LAB — HM DIABETES EYE EXAM

## 2021-08-26 ENCOUNTER — Encounter: Payer: Self-pay | Admitting: Family Medicine

## 2021-08-26 ENCOUNTER — Ambulatory Visit (INDEPENDENT_AMBULATORY_CARE_PROVIDER_SITE_OTHER): Payer: BC Managed Care – PPO | Admitting: Family Medicine

## 2021-08-26 ENCOUNTER — Other Ambulatory Visit: Payer: Self-pay

## 2021-08-26 ENCOUNTER — Ambulatory Visit: Payer: BC Managed Care – PPO | Admitting: Family Medicine

## 2021-08-26 VITALS — BP 119/87 | HR 99 | Temp 99.0°F | Ht 64.49 in | Wt 191.8 lb

## 2021-08-26 DIAGNOSIS — F5104 Psychophysiologic insomnia: Secondary | ICD-10-CM | POA: Diagnosis not present

## 2021-08-26 DIAGNOSIS — E1165 Type 2 diabetes mellitus with hyperglycemia: Secondary | ICD-10-CM | POA: Diagnosis not present

## 2021-08-26 DIAGNOSIS — R0789 Other chest pain: Secondary | ICD-10-CM

## 2021-08-26 DIAGNOSIS — F331 Major depressive disorder, recurrent, moderate: Secondary | ICD-10-CM | POA: Diagnosis not present

## 2021-08-26 LAB — BAYER DCA HB A1C WAIVED: HB A1C (BAYER DCA - WAIVED): 7.5 % — ABNORMAL HIGH (ref 4.8–5.6)

## 2021-08-26 MED ORDER — METFORMIN HCL ER 500 MG PO TB24: 1000.0000 mg | ORAL_TABLET | Freq: Every day | ORAL | 1 refills | Status: DC

## 2021-08-26 MED ORDER — ZOLPIDEM TARTRATE 5 MG PO TABS: 5.0000 mg | ORAL_TABLET | Freq: Every evening | ORAL | 2 refills | Status: DC | PRN

## 2021-08-26 MED ORDER — SERTRALINE HCL 100 MG PO TABS: 100.0000 mg | ORAL_TABLET | Freq: Every day | ORAL | 1 refills | Status: DC

## 2021-08-26 NOTE — Assessment & Plan Note (Signed)
A1c up to 7.5- will really watch diet and recheck 3 months. If still high, will increase metformin. Call with any concerns. Continue to monitor.

## 2021-08-26 NOTE — Progress Notes (Signed)
BP 119/87    Pulse 99    Temp 99 F (37.2 C) (Oral)    Ht 5' 4.49" (1.638 m)    Wt 191 lb 12.8 oz (87 kg)    SpO2 99%    BMI 32.43 kg/m    Subjective:    Patient ID: Maria Little, female    DOB: 1976/07/18, 46 y.o.   MRN: [id number]  HPI: Maria Little is a 46 y.o. female  Chief Complaint  Patient presents with   Diabetes   Depression   Insomnia   CHEST PAIN Duration: on and off for about a month last about 1-2 weeks Onset: gradual Quality: throbbing Severity: mild to moderate Location: center of her chest Radiation: none Episode duration: 20-30 seconds  Frequency: intermittent unable to say how often it's happening Related to exertion: yes Activity when pain started: worse with lifting Trauma: no Anxiety/recent stressors: yes Status: better Treatments attempted: aleve  Current pain status: pain free Shortness of breath: yes Cough: no Nausea: no Diaphoresis: no Heartburn: no Palpitations: no  DIABETES Hypoglycemic episodes:no Polydipsia/polyuria: no Visual disturbance: no Chest pain: yes Paresthesias: no Glucose Monitoring: yes  Accucheck frequency: Daily Taking Insulin?: no Blood Pressure Monitoring: not checking Retinal Examination: Not up to Date Foot Exam: Up to Date Diabetic Education: Completed Pneumovax: Up to Date Influenza: Up to Date Aspirin: no  DEPRESSION- unsure if she's overtreated- states that she feels almost too calm Mood status: better Satisfied with current treatment?: no Symptom severity: mild  Duration of current treatment : months Side effects: no Medication compliance: excellent compliance Psychotherapy/counseling: no  Previous psychiatric medications: sertraline Depressed mood: yes Anxious mood: yes Anhedonia: no Significant weight loss or gain: no Insomnia: yes hard to fall asleep Fatigue: yes Feelings of worthlessness or guilt: no Impaired concentration/indecisiveness: no Suicidal ideations:  no Hopelessness: no Crying spells: no Depression screen [facility] 2/9 08/26/2021 02/13/2021 10/15/2020 09/03/2020 11/19/2019  Decreased Interest 0 1 0 0 0  Down, Depressed, Hopeless 0 1 1 1  0  PHQ - 2 Score 0 2 1 1  0  Altered sleeping 3 3 3 3 3   Tired, decreased energy 0 0 0 0 0  Change in appetite 2 3 3 1  0  Feeling bad or failure about yourself  0 0 0 3 3  Trouble concentrating 0 0 1 0 0  Moving slowly or fidgety/restless 0 0 0 0 0  Suicidal thoughts 0 0 1 0 0  PHQ-9 Score 5 8 9 8 6   Difficult doing work/chores - Somewhat difficult Somewhat difficult Somewhat difficult Somewhat difficult  Some recent data might be hidden   INSOMNIA Duration: chronic Satisfied with sleep quality: yes Difficulty falling asleep: no Difficulty staying asleep: no Waking a few hours after sleep onset: no Early morning awakenings: no Daytime hypersomnolence: no Wakes feeling refreshed: yes Good sleep hygiene: yes Apnea: no Snoring: no Depressed/anxious mood: yes Recent stress: yes Restless legs/nocturnal leg cramps: no Chronic pain/arthritis: no History of sleep study: no Treatments attempted: melatonin, uinsom, benadryl, and ambien   Relevant past medical, surgical, family and social history reviewed and updated as indicated. Interim medical history since our last visit reviewed. Allergies and medications reviewed and updated.  Review of Systems  Constitutional: Negative.   Respiratory: Negative.    Cardiovascular:  Positive for chest pain. Negative for palpitations and leg swelling.  Gastrointestinal: Negative.   Musculoskeletal:  Positive for arthralgias. Negative for back pain, gait problem, joint swelling, myalgias, neck pain and neck stiffness.  Skin: Negative.   Neurological:  Positive for numbness. Negative for dizziness, tremors, seizures, syncope, facial asymmetry, speech difficulty, weakness, light-headedness and headaches.  Psychiatric/Behavioral: Negative.     Per HPI unless specifically  indicated above     Objective:    BP 119/87    Pulse 99    Temp 99 F (37.2 C) (Oral)    Ht 5' 4.49" (1.638 m)    Wt 191 lb 12.8 oz (87 kg)    SpO2 99%    BMI 32.43 kg/m   Wt Readings from Last 3 Encounters:  08/26/21 191 lb 12.8 oz (87 kg)  06/22/21 197 lb 6.4 oz (89.5 kg)  05/25/21 195 lb (88.5 kg)    Physical Exam Vitals and nursing note reviewed.  Constitutional:      General: She is not in acute distress.    Appearance: Normal appearance. She is not ill-appearing, toxic-appearing or diaphoretic.  HENT:     Head: Normocephalic and atraumatic.     Right Ear: External ear normal.     Left Ear: External ear normal.     Nose: Nose normal.     Mouth/Throat:     Mouth: Mucous membranes are moist.     Pharynx: Oropharynx is clear.  Eyes:     General: No scleral icterus.       Right eye: No discharge.        Left eye: No discharge.     Extraocular Movements: Extraocular movements intact.     Conjunctiva/sclera: Conjunctivae normal.     Pupils: Pupils are equal, round, and reactive to light.  Cardiovascular:     Rate and Rhythm: Normal rate and regular rhythm.     Pulses: Normal pulses.     Heart sounds: Normal heart sounds. No murmur heard.   No friction rub. No gallop.  Pulmonary:     Effort: Pulmonary effort is normal. No respiratory distress.     Breath sounds: Normal breath sounds. No stridor. No wheezing, rhonchi or rales.  Chest:     Chest wall: No tenderness.  Musculoskeletal:        General: Normal range of motion.     Cervical back: Normal range of motion and neck supple.  Skin:    General: Skin is warm and dry.     Capillary Refill: Capillary refill takes less than 2 seconds.     Coloration: Skin is not jaundiced or pale.     Findings: No bruising, erythema, lesion or rash.  Neurological:     General: No focal deficit present.     Mental Status: She is alert and oriented to person, place, and time. Mental status is at baseline.  Psychiatric:        Mood  and Affect: Mood normal.        Behavior: Behavior normal.        Thought Content: Thought content normal.        Judgment: Judgment normal.    Results for orders placed or performed in visit on 08/26/21  Bayer DCA Hb A1c Waived  Result Value Ref Range   HB A1C (BAYER DCA - WAIVED) 7.5 (H) 4.8 - 5.6 %      Assessment & Plan:   Problem List Items Addressed This Visit       Endocrine   Type 2 diabetes mellitus with hyperglycemia, without long-term current use of insulin (HCC) - Primary    A1c up to 7.5- will really watch diet and recheck 3 months. If  still high, will increase metformin. Call with any concerns. Continue to monitor.       Relevant Medications   metFORMIN (GLUCOPHAGE-XR) 500 MG 24 hr tablet   Other Relevant Orders   Bayer DCA Hb A1c Waived (Completed)     Other   Depression, major, recurrent, moderate (HCC)    Feels like she is too relaxed on the 150mg  of sertraline. Will decrease back down to 100mg . Continue to monitor. Call with any concerns.       Relevant Medications   sertraline (ZOLOFT) 100 MG tablet   Psychophysiological insomnia    Under good control on current regimen. Continue current regimen. Continue to monitor. Call with any concerns. Refills given for 3 months. Follow up 3 months.        Other Visit Diagnoses     Other chest pain       EKG normal. Likely due to stress and msk issues. Continue to monitor. Call with any concerns.    Relevant Orders   EKG 12-Lead (Completed)        Follow up plan: Return in about 3 months (around 11/23/2021).

## 2021-08-26 NOTE — Assessment & Plan Note (Signed)
Under good control on current regimen. Continue current regimen. Continue to monitor. Call with any concerns. Refills given for 3 months. Follow up 3 months.    

## 2021-08-26 NOTE — Assessment & Plan Note (Signed)
Feels like she is too relaxed on the 150mg  of sertraline. Will decrease back down to 100mg . Continue to monitor. Call with any concerns.

## 2021-08-27 NOTE — Progress Notes (Signed)
Interpreted by me on 08/26/21. NSR at 85bpm, no ST segment changes.

## 2021-09-23 ENCOUNTER — Other Ambulatory Visit: Payer: Self-pay

## 2021-09-23 ENCOUNTER — Ambulatory Visit (INDEPENDENT_AMBULATORY_CARE_PROVIDER_SITE_OTHER): Payer: BC Managed Care – PPO

## 2021-09-23 DIAGNOSIS — Z3042 Encounter for surveillance of injectable contraceptive: Secondary | ICD-10-CM | POA: Diagnosis not present

## 2021-09-23 MED ORDER — MEDROXYPROGESTERONE ACETATE 150 MG/ML IM SUSP
150.0000 mg | Freq: Once | INTRAMUSCULAR | Status: AC
  Administered 2021-09-23: 150 mg via INTRAMUSCULAR

## 2021-11-01 ENCOUNTER — Encounter: Payer: Self-pay | Admitting: Family Medicine

## 2021-11-03 ENCOUNTER — Encounter: Payer: Self-pay | Admitting: Family Medicine

## 2021-11-03 ENCOUNTER — Other Ambulatory Visit: Payer: Self-pay

## 2021-11-03 NOTE — Telephone Encounter (Signed)
Patient is requesting a refill of Metformin , has appt on 5/10 ?

## 2021-11-25 ENCOUNTER — Ambulatory Visit (INDEPENDENT_AMBULATORY_CARE_PROVIDER_SITE_OTHER): Payer: BC Managed Care – PPO | Admitting: Family Medicine

## 2021-11-25 ENCOUNTER — Encounter: Payer: Self-pay | Admitting: Family Medicine

## 2021-11-25 VITALS — BP 119/77 | HR 89 | Temp 98.5°F | Wt 199.6 lb

## 2021-11-25 DIAGNOSIS — E1165 Type 2 diabetes mellitus with hyperglycemia: Secondary | ICD-10-CM

## 2021-11-25 DIAGNOSIS — E782 Mixed hyperlipidemia: Secondary | ICD-10-CM

## 2021-11-25 DIAGNOSIS — F331 Major depressive disorder, recurrent, moderate: Secondary | ICD-10-CM | POA: Diagnosis not present

## 2021-11-25 LAB — BAYER DCA HB A1C WAIVED: HB A1C (BAYER DCA - WAIVED): 6.9 % — ABNORMAL HIGH (ref 4.8–5.6)

## 2021-11-25 MED ORDER — "PEN NEEDLES 3/16"" 31G X 5 MM MISC": 1.0000 | 0 refills | Status: DC

## 2021-11-25 MED ORDER — OZEMPIC (0.25 OR 0.5 MG/DOSE) 2 MG/1.5ML ~~LOC~~ SOPN: 0.2500 mg | PEN_INJECTOR | SUBCUTANEOUS | 0 refills | Status: DC

## 2021-11-25 NOTE — Assessment & Plan Note (Signed)
Rechecking levels today. Await results. Treat as needed.  

## 2021-11-25 NOTE — Progress Notes (Signed)
? ?BP 119/77   Pulse 89   Temp 98.5 ?F (36.9 ?C)   Wt 199 lb 9.6 oz (90.5 kg)   SpO2 98%   BMI 33.74 kg/m?   ? ?Subjective:  ? ? Patient ID: Maria Little, female    DOB: [birth year], 46 y.o.   MRN: [id number] ? ?HPI: ?Maria Little is a 46 y.o. female ? ?Chief Complaint  ?Patient presents with  ? Diabetes  ? Depression  ? ?DIABETES ?Hypoglycemic episodes:no ?Polydipsia/polyuria: no ?Visual disturbance: no ?Chest pain: no ?Paresthesias: no ?Glucose Monitoring: no ? Accucheck frequency: Not Checking ?Taking Insulin?: no ?Blood Pressure Monitoring: not checking ?Retinal Examination: Up to Date ?Foot Exam: Not up to Date ?Diabetic Education: Completed ?Pneumovax: Up to Date ?Influenza: Up to Date ?Aspirin: no ? ?DEPRESSION ?Mood status: better ?Satisfied with current treatment?: yes ?Symptom severity: mild  ?Duration of current treatment : chronic ?Side effects: no ?Medication compliance: excellent compliance ?Psychotherapy/counseling: no  ?Previous psychiatric medications: sertraline ?Depressed mood: no ?Anxious mood: no ?Anhedonia: no ?Significant weight loss or gain: no ?Insomnia: yes hard to fall asleep ?Fatigue: yes ?Feelings of worthlessness or guilt: no ?Impaired concentration/indecisiveness: no ?Suicidal ideations: no ?Hopelessness: no ?Crying spells: no ? ?  11/25/2021  ?  8:34 AM 08/26/2021  ?  8:20 AM 02/13/2021  ?  8:35 AM 10/15/2020  ?  9:44 AM 09/03/2020  ? 12:09 PM  ?Depression screen PHQ 2/9  ?Decreased Interest 0 0 1 0 0  ?Down, Depressed, Hopeless 0 0 1 1 1   ?PHQ - 2 Score 0 0 2 1 1   ?Altered sleeping 2 3 3 3 3   ?Tired, decreased energy 0 0 0 0 0  ?Change in appetite 0 2 3 3 1   ?Feeling bad or failure about yourself  0 0 0 0 3  ?Trouble concentrating 0 0 0 1 0  ?Moving slowly or fidgety/restless 0 0 0 0 0  ?Suicidal thoughts 0 0 0 1 0  ?PHQ-9 Score 2 5 8 9 8   ?Difficult doing work/chores   Somewhat difficult Somewhat difficult Somewhat difficult  ? ? ?Relevant past medical, surgical, family and  social history reviewed and updated as indicated. Interim medical history since our last visit reviewed. ?Allergies and medications reviewed and updated. ? ?Review of Systems  ?Constitutional: Negative.   ?Respiratory: Negative.    ?Cardiovascular: Negative.   ?Gastrointestinal: Negative.   ?Musculoskeletal: Negative.   ?Neurological: Negative.   ?Psychiatric/Behavioral: Negative.    ? ?Per HPI unless specifically indicated above ? ?   ?Objective:  ?  ?BP 119/77   Pulse 89   Temp 98.5 ?F (36.9 ?C)   Wt 199 lb 9.6 oz (90.5 kg)   SpO2 98%   BMI 33.74 kg/m?   ?Wt Readings from Last 3 Encounters:  ?11/25/21 199 lb 9.6 oz (90.5 kg)  ?08/26/21 191 lb 12.8 oz (87 kg)  ?06/22/21 197 lb 6.4 oz (89.5 kg)  ?  ?Physical Exam ?Vitals and nursing note reviewed.  ?Constitutional:   ?   General: She is not in acute distress. ?   Appearance: Normal appearance. She is not ill-appearing, toxic-appearing or diaphoretic.  ?HENT:  ?   Head: Normocephalic and atraumatic.  ?   Right Ear: External ear normal.  ?   Left Ear: External ear normal.  ?   Nose: Nose normal.  ?   Mouth/Throat:  ?   Mouth: Mucous membranes are moist.  ?   Pharynx: Oropharynx is clear.  ?Eyes:  ?  General: No scleral icterus.    ?   Right eye: No discharge.     ?   Left eye: No discharge.  ?   Extraocular Movements: Extraocular movements intact.  ?   Conjunctiva/sclera: Conjunctivae normal.  ?   Pupils: Pupils are equal, round, and reactive to light.  ?Cardiovascular:  ?   Rate and Rhythm: Normal rate and regular rhythm.  ?   Pulses: Normal pulses.  ?   Heart sounds: Normal heart sounds. No murmur heard. ?  No friction rub. No gallop.  ?Pulmonary:  ?   Effort: Pulmonary effort is normal. No respiratory distress.  ?   Breath sounds: Normal breath sounds. No stridor. No wheezing, rhonchi or rales.  ?Chest:  ?   Chest wall: No tenderness.  ?Musculoskeletal:     ?   General: Normal range of motion.  ?   Cervical back: Normal range of motion and neck supple.   ?Skin: ?   General: Skin is warm and dry.  ?   Capillary Refill: Capillary refill takes less than 2 seconds.  ?   Coloration: Skin is not jaundiced or pale.  ?   Findings: No bruising, erythema, lesion or rash.  ?Neurological:  ?   General: No focal deficit present.  ?   Mental Status: She is alert and oriented to person, place, and time. Mental status is at baseline.  ?Psychiatric:     ?   Mood and Affect: Mood normal.     ?   Behavior: Behavior normal.     ?   Thought Content: Thought content normal.     ?   Judgment: Judgment normal.  ? ? ?Results for orders placed or performed in visit on 09/09/21  ?HM DIABETES EYE EXAM  ?Result Value Ref Range  ? HM Diabetic Eye Exam No Retinopathy No Retinopathy  ? ?   ?Assessment & Plan:  ? ?Problem List Items Addressed This Visit   ? ?  ? Endocrine  ? Type 2 diabetes mellitus with hyperglycemia, without long-term current use of insulin (HCC) - Primary  ?  Much better with A1c of 6.9. Will continue metformin and start ozempic for weight and sugar control. Recheck 1 month. Call with any concerns.  ? ?  ?  ? Relevant Medications  ? Semaglutide,0.25 or 0.5MG /DOS, (OZEMPIC, 0.25 OR 0.5 MG/DOSE,) 2 MG/1.5ML SOPN  ? Other Relevant Orders  ? Bayer DCA Hb A1c Waived  ? Comprehensive metabolic panel  ? CBC with Differential/Platelet  ? Lipid Panel w/o Chol/HDL Ratio  ?  ? Other  ? Depression, major, recurrent, moderate (HCC)  ?  Under good control on current regimen. Continue current regimen. Continue to monitor. Call with any concerns. Refills given. Labs drawn today.  ? ? ?  ?  ? Hyperlipidemia  ?  Rechecking levels today. Await results. Treat as needed.  ? ?  ?  ?  ? ?Follow up plan: ?Return in about 4 weeks (around 12/23/2021). ? ? ? ? ? ?

## 2021-11-25 NOTE — Assessment & Plan Note (Signed)
Under good control on current regimen. Continue current regimen. Continue to monitor. Call with any concerns. Refills given. Labs drawn today.   

## 2021-11-25 NOTE — Assessment & Plan Note (Signed)
Much better with A1c of 6.9. Will continue metformin and start ozempic for weight and sugar control. Recheck 1 month. Call with any concerns.  ?

## 2021-12-01 ENCOUNTER — Ambulatory Visit
Admission: RE | Admit: 2021-12-01 | Discharge: 2021-12-01 | Disposition: A | Discharge status: Home / Self Care | Payer: BC Managed Care – PPO | Source: Ambulatory Visit | Attending: Family Medicine | Admitting: Family Medicine

## 2021-12-01 ENCOUNTER — Ambulatory Visit (INDEPENDENT_AMBULATORY_CARE_PROVIDER_SITE_OTHER): Payer: BC Managed Care – PPO | Admitting: Family Medicine

## 2021-12-01 ENCOUNTER — Encounter: Payer: Self-pay | Admitting: Family Medicine

## 2021-12-01 VITALS — BP 119/74 | Temp 98.7°F | Wt 197.6 lb

## 2021-12-01 DIAGNOSIS — E119 Type 2 diabetes mellitus without complications: Secondary | ICD-10-CM | POA: Diagnosis not present

## 2021-12-01 DIAGNOSIS — R42 Dizziness and giddiness: Secondary | ICD-10-CM | POA: Diagnosis not present

## 2021-12-01 DIAGNOSIS — I10 Essential (primary) hypertension: Secondary | ICD-10-CM | POA: Diagnosis not present

## 2021-12-01 DIAGNOSIS — H81399 Other peripheral vertigo, unspecified ear: Secondary | ICD-10-CM | POA: Insufficient documentation

## 2021-12-01 NOTE — Telephone Encounter (Signed)
Appt OK to book with different provider if something available sooner ?

## 2021-12-01 NOTE — Progress Notes (Signed)
? ?BP 119/74   Temp 98.7 ?F (37.1 ?C)   Wt 197 lb 9.6 oz (89.6 kg)   SpO2 98%   BMI 33.41 kg/m?   ? ?Subjective:  ? ? Patient ID: Maria Little, female    DOB: Apr 22, 1976, 46 y.o.   MRN: [id number] ? ?HPI: ?Maria Little is a 46 y.o. female ? ?Chief Complaint  ?Patient presents with  ? Dizziness  ?  Patient states she started feeling dizzy this morning. Patient states this happened years ago and was seen in ED and was told it was vertigo.   ? ?DIZZINESS ?Duration: 5 days ?Description of symptoms: room spinning ?Duration of episode:  few minutes ?Dizziness frequency: recurrent ?Triggered by rolling over in bed: no ?Triggered by bending over: yes ?Aggravated by head movement: yes ?Aggravated by exertion, coughing, loud noises: no ?Recent head injury: no ?Recent or current viral symptoms: no ?History of vasovagal episodes: no ?Nausea: yes ?Vomiting: yes ?Tinnitus: no ?Hearing loss: no ?Aural fullness: no ?Headache: yes ?Photophobia/phonophobia: no ?Unsteady gait: yes ?Postural instability: yes ?Diplopia, dysarthria, dysphagia or weakness: yes ?Related to exertion: no ?Pallor: no ?Diaphoresis: yes ?Dyspnea: no ?Chest pain: no ? ?Relevant past medical, surgical, family and social history reviewed and updated as indicated. Interim medical history since our last visit reviewed. ?Allergies and medications reviewed and updated. ? ?Review of Systems  ?Constitutional:  Positive for fatigue. Negative for activity change, appetite change, chills, diaphoresis, fever and unexpected weight change.  ?Eyes:  Positive for photophobia and visual disturbance. Negative for pain, discharge, redness and itching.  ?Respiratory: Negative.    ?Cardiovascular: Negative.   ?Genitourinary: Negative.   ?Neurological:  Positive for dizziness, speech difficulty, light-headedness and headaches. Negative for tremors, seizures, syncope, facial asymmetry, weakness and numbness.  ?Hematological: Negative.   ?Psychiatric/Behavioral: Negative.     ? ?Per HPI unless specifically indicated above ? ?   ?Objective:  ?  ?BP 119/74   Temp 98.7 ?F (37.1 ?C)   Wt 197 lb 9.6 oz (89.6 kg)   SpO2 98%   BMI 33.41 kg/m?   ?Wt Readings from Last 3 Encounters:  ?12/01/21 197 lb 9.6 oz (89.6 kg)  ?11/25/21 199 lb 9.6 oz (90.5 kg)  ?08/26/21 191 lb 12.8 oz (87 kg)  ?  ?Physical Exam ?Vitals and nursing note reviewed.  ?Constitutional:   ?   General: She is not in acute distress. ?   Appearance: Normal appearance. She is not ill-appearing, toxic-appearing or diaphoretic.  ?HENT:  ?   Head: Normocephalic and atraumatic.  ?   Right Ear: Tympanic membrane, ear canal and external ear normal.  ?   Left Ear: Tympanic membrane, ear canal and external ear normal.  ?   Nose: Nose normal. No congestion or rhinorrhea.  ?   Mouth/Throat:  ?   Mouth: Mucous membranes are moist.  ?   Pharynx: Oropharynx is clear. No oropharyngeal exudate or posterior oropharyngeal erythema.  ?Eyes:  ?   General: No scleral icterus.    ?   Right eye: No discharge.     ?   Left eye: No discharge.  ?   Extraocular Movements: Extraocular movements intact.  ?   Right eye: Nystagmus present.  ?   Left eye: No nystagmus.  ?   Conjunctiva/sclera: Conjunctivae normal.  ?   Pupils: Pupils are equal, round, and reactive to light.  ?Cardiovascular:  ?   Rate and Rhythm: Normal rate and regular rhythm.  ?   Pulses: Normal pulses.  ?  Heart sounds: Normal heart sounds. No murmur heard. ?  No friction rub. No gallop.  ?Pulmonary:  ?   Effort: Pulmonary effort is normal. No respiratory distress.  ?   Breath sounds: Normal breath sounds. No stridor. No wheezing, rhonchi or rales.  ?Chest:  ?   Chest wall: No tenderness.  ?Musculoskeletal:     ?   General: Normal range of motion.  ?   Cervical back: Normal range of motion and neck supple.  ?Skin: ?   General: Skin is warm and dry.  ?   Capillary Refill: Capillary refill takes less than 2 seconds.  ?   Coloration: Skin is not jaundiced or pale.  ?   Findings: No  bruising, erythema, lesion or rash.  ?Neurological:  ?   General: No focal deficit present.  ?   Mental Status: She is alert and oriented to person, place, and time. Mental status is at baseline.  ?   Cranial Nerves: No cranial nerve deficit.  ?   Sensory: No sensory deficit.  ?   Gait: Gait abnormal.  ?Psychiatric:     ?   Mood and Affect: Mood normal.     ?   Behavior: Behavior normal.     ?   Thought Content: Thought content normal.     ?   Judgment: Judgment normal.  ? ? ?Results for orders placed or performed in visit on 11/25/21  ?Bayer DCA Hb A1c Waived  ?Result Value Ref Range  ? HB A1C (BAYER DCA - WAIVED) 6.9 (H) 4.8 - 5.6 %  ? ?   ?Assessment & Plan:  ? ?Problem List Items Addressed This Visit   ?None ?Visit Diagnoses   ? ? Other peripheral vertigo, unspecified ear    -  Primary  ? Given headache, risk factors and postural instability will check CT head to R/O stroke. Start epley's. If not better by Thursday will refer to physical therapy.  ? Relevant Orders  ? CT HEAD WO CONTRAST ( )  ? ?  ?  ? ?Follow up plan: ?Return if symptoms worsen or fail to improve. ? ? ? ? ? ?

## 2021-12-01 NOTE — Telephone Encounter (Signed)
Patient scheduled today @ 2:40 ?

## 2021-12-10 ENCOUNTER — Ambulatory Visit (INDEPENDENT_AMBULATORY_CARE_PROVIDER_SITE_OTHER): Payer: BC Managed Care – PPO

## 2021-12-10 DIAGNOSIS — Z3042 Encounter for surveillance of injectable contraceptive: Secondary | ICD-10-CM

## 2021-12-10 MED ORDER — MEDROXYPROGESTERONE ACETATE 150 MG/ML IM SUSP
150.0000 mg | Freq: Once | INTRAMUSCULAR | Status: AC
  Administered 2021-12-10: 150 mg via INTRAMUSCULAR

## 2021-12-30 ENCOUNTER — Encounter: Payer: Self-pay | Admitting: Family Medicine

## 2021-12-30 ENCOUNTER — Ambulatory Visit (INDEPENDENT_AMBULATORY_CARE_PROVIDER_SITE_OTHER): Payer: BC Managed Care – PPO | Admitting: Family Medicine

## 2021-12-30 VITALS — BP 126/85 | HR 91 | Temp 98.7°F | Wt 193.0 lb

## 2021-12-30 DIAGNOSIS — M79642 Pain in left hand: Secondary | ICD-10-CM | POA: Diagnosis not present

## 2021-12-30 DIAGNOSIS — M79641 Pain in right hand: Secondary | ICD-10-CM

## 2021-12-30 DIAGNOSIS — E1165 Type 2 diabetes mellitus with hyperglycemia: Secondary | ICD-10-CM | POA: Diagnosis not present

## 2021-12-30 MED ORDER — OZEMPIC (0.25 OR 0.5 MG/DOSE) 2 MG/1.5ML ~~LOC~~ SOPN: 0.5000 mg | PEN_INJECTOR | SUBCUTANEOUS | 0 refills | Status: AC

## 2021-12-30 NOTE — Assessment & Plan Note (Signed)
Tolerating her ozempic well and losing weight appropriately. Will increase her to 0.5mg  and recheck 2 month with A1c. Call with any concerns.

## 2021-12-30 NOTE — Progress Notes (Signed)
BP 126/85   Pulse 91   Temp 98.7 F (37.1 C)   Wt 193 lb (87.5 kg)   SpO2 98%   BMI 32.63 kg/m    Subjective:    Patient ID: Maria Little, female    DOB: [birth year], 46 y.o.   MRN: [id number]  HPI: Maria Little is a 46 y.o. female  Chief Complaint  Patient presents with   Diabetes    Patient has been taking ozempic but forgot her dose last week. Took a dose last night.    Numbness    Patient states she has numbness in her fingers in both her hands, patient states she also can't bend her fingers all the way. Patient is having difficulty with gripping and picking things up.    DIABETES Hypoglycemic episodes:no Polydipsia/polyuria: no Visual disturbance: no Chest pain: no Paresthesias: no Glucose Monitoring: no  Accucheck frequency: Not Checking Taking Insulin?: no Blood Pressure Monitoring: not checking Retinal Examination: Up to Date Foot Exam: Up to Date Diabetic Education: Completed Pneumovax: Up to Date Influenza: Up to Date Aspirin: no  Has been having hand pain in both hands and discomfort. She has not been able to fully grasp. She has not tried anything for it. No other concerns or complaints today.  Relevant past medical, surgical, family and social history reviewed and updated as indicated. Interim medical history since our last visit reviewed. Allergies and medications reviewed and updated.  Review of Systems  Constitutional: Negative.   Respiratory: Negative.    Cardiovascular: Negative.   Gastrointestinal: Negative.   Musculoskeletal: Negative.   Neurological: Negative.   Psychiatric/Behavioral: Negative.      Per HPI unless specifically indicated above     Objective:    BP 126/85   Pulse 91   Temp 98.7 F (37.1 C)   Wt 193 lb (87.5 kg)   SpO2 98%   BMI 32.63 kg/m   Wt Readings from Last 3 Encounters:  12/30/21 193 lb (87.5 kg)  12/01/21 197 lb 9.6 oz (89.6 kg)  11/25/21 199 lb 9.6 oz (90.5 kg)    Physical Exam Vitals and  nursing note reviewed.  Constitutional:      General: She is not in acute distress.    Appearance: Normal appearance. She is obese. She is not ill-appearing, toxic-appearing or diaphoretic.  HENT:     Head: Normocephalic and atraumatic.     Right Ear: External ear normal.     Left Ear: External ear normal.     Nose: Nose normal.     Mouth/Throat:     Mouth: Mucous membranes are moist.     Pharynx: Oropharynx is clear.  Eyes:     General: No scleral icterus.       Right eye: No discharge.        Left eye: No discharge.     Extraocular Movements: Extraocular movements intact.     Conjunctiva/sclera: Conjunctivae normal.     Pupils: Pupils are equal, round, and reactive to light.  Cardiovascular:     Rate and Rhythm: Normal rate and regular rhythm.     Pulses: Normal pulses.     Heart sounds: Normal heart sounds. No murmur heard.    No friction rub. No gallop.  Pulmonary:     Effort: Pulmonary effort is normal. No respiratory distress.     Breath sounds: Normal breath sounds. No stridor. No wheezing, rhonchi or rales.  Chest:     Chest wall: No tenderness.  Musculoskeletal:  General: Normal range of motion.     Cervical back: Normal range of motion and neck supple.  Skin:    General: Skin is warm and dry.     Capillary Refill: Capillary refill takes less than 2 seconds.     Coloration: Skin is not jaundiced or pale.     Findings: No bruising, erythema, lesion or rash.  Neurological:     General: No focal deficit present.     Mental Status: She is alert and oriented to person, place, and time. Mental status is at baseline.  Psychiatric:        Mood and Affect: Mood normal.        Behavior: Behavior normal.        Thought Content: Thought content normal.        Judgment: Judgment normal.     Results for orders placed or performed in visit on 11/25/21  Bayer DCA Hb A1c Waived  Result Value Ref Range   HB A1C (BAYER DCA - WAIVED) 6.9 (H) 4.8 - 5.6 %       Assessment & Plan:   Problem List Items Addressed This Visit       Endocrine   Type 2 diabetes mellitus with hyperglycemia, without long-term current use of insulin (HCC) - Primary    Tolerating her ozempic well and losing weight appropriately. Will increase her to 0.5mg  and recheck 2 month with A1c. Call with any concerns.      Relevant Medications   Semaglutide,0.25 or 0.5MG /DOS, (OZEMPIC, 0.25 OR 0.5 MG/DOSE,) 2 MG/1.5ML SOPN   Other Relevant Orders   Comprehensive metabolic panel   CBC with Differential/Platelet   Lipid Panel w/o Chol/HDL Ratio   Other Visit Diagnoses     Pain in both hands       Will try OTC medicine. Continue to monitor. Will x-ray if not getting better.         Follow up plan: Return in about 2 months (around 03/01/2022).

## 2021-12-31 LAB — CBC WITH DIFFERENTIAL/PLATELET
Basophils Absolute: 0 10*3/uL (ref 0.0–0.2)
Basos: 1 %
EOS (ABSOLUTE): 0.1 10*3/uL (ref 0.0–0.4)
Eos: 1 %
Hematocrit: 40.2 % (ref 34.0–46.6)
Hemoglobin: 12.8 g/dL (ref 11.1–15.9)
Immature Grans (Abs): 0 10*3/uL (ref 0.0–0.1)
Immature Granulocytes: 0 %
Lymphocytes Absolute: 2.7 10*3/uL (ref 0.7–3.1)
Lymphs: 34 %
MCH: 26.4 pg — ABNORMAL LOW (ref 26.6–33.0)
MCHC: 31.8 g/dL (ref 31.5–35.7)
MCV: 83 fL (ref 79–97)
Monocytes Absolute: 0.6 10*3/uL (ref 0.1–0.9)
Monocytes: 7 %
Neutrophils Absolute: 4.5 10*3/uL (ref 1.4–7.0)
Neutrophils: 57 %
Platelets: 360 10*3/uL (ref 150–450)
RBC: 4.84 x10E6/uL (ref 3.77–5.28)
RDW: 13 % (ref 11.7–15.4)
WBC: 7.8 10*3/uL (ref 3.4–10.8)

## 2021-12-31 LAB — LIPID PANEL W/O CHOL/HDL RATIO
Cholesterol, Total: 247 mg/dL — ABNORMAL HIGH (ref 100–199)
HDL: 61 mg/dL
LDL Chol Calc (NIH): 168 mg/dL — ABNORMAL HIGH (ref 0–99)
Triglycerides: 103 mg/dL (ref 0–149)
VLDL Cholesterol Cal: 18 mg/dL (ref 5–40)

## 2021-12-31 LAB — COMPREHENSIVE METABOLIC PANEL
ALT: 9 IU/L (ref 0–32)
AST: 15 IU/L (ref 0–40)
Albumin/Globulin Ratio: 1.4 (ref 1.2–2.2)
Albumin: 4.2 g/dL (ref 3.8–4.8)
Alkaline Phosphatase: 95 IU/L (ref 44–121)
BUN/Creatinine Ratio: 14 (ref 9–23)
BUN: 10 mg/dL (ref 6–24)
Bilirubin Total: 0.3 mg/dL (ref 0.0–1.2)
CO2: 20 mmol/L (ref 20–29)
Calcium: 9.7 mg/dL (ref 8.7–10.2)
Chloride: 105 mmol/L (ref 96–106)
Creatinine, Ser: 0.71 mg/dL (ref 0.57–1.00)
Globulin, Total: 3 g/dL (ref 1.5–4.5)
Glucose: 130 mg/dL — ABNORMAL HIGH (ref 70–99)
Potassium: 3.6 mmol/L (ref 3.5–5.2)
Sodium: 143 mmol/L (ref 134–144)
Total Protein: 7.2 g/dL (ref 6.0–8.5)
eGFR: 107 mL/min/{1.73_m2} (ref >59)

## 2022-01-26 ENCOUNTER — Other Ambulatory Visit: Payer: Self-pay | Admitting: Family Medicine

## 2022-01-27 NOTE — Telephone Encounter (Signed)
Patient has 3 month follow up appointment 02/26/22.

## 2022-01-27 NOTE — Telephone Encounter (Signed)
Requested medication (s) are due for refill today: yes  Requested medication (s) are on the active medication list: yes    Last refill: 08/26/21  #330  2 refills  Future visit scheduled yes 02/26/22  Notes to clinic:not delegated, please review.  Requested Prescriptions  Pending Prescriptions Disp Refills   zolpidem (AMBIEN) 5 MG tablet [Pharmacy Med Name: Zolpidem Tartrate 5 MG Oral Tablet] 30 tablet 0    Sig: TAKE 1 TABLET BY MOUTH AT BEDTIME AS NEEDED FOR SLEEP     Not Delegated - Psychiatry:  Anxiolytics/Hypnotics Failed - 01/26/2022  9:50 AM      Failed - This refill cannot be delegated      Failed - Urine Drug Screen completed in last 360 days      Passed - Valid encounter within last 6 months    Recent Outpatient Visits           4 weeks ago Type 2 diabetes mellitus with hyperglycemia, without long-term current use of insulin (HCC)   [facility] [place], Megan P, DO   1 month ago Other peripheral vertigo, unspecified ear   [facility] [place], Megan P, DO   2 months ago Type 2 diabetes mellitus with hyperglycemia, without long-term current use of insulin (HCC)   [facility] [place], Megan P, DO   5 months ago Type 2 diabetes mellitus with hyperglycemia, without long-term current use of insulin (HCC)   [facility] [place], Megan P, DO   7 months ago Upper respiratory tract infection, unspecified type   [facility] [place], [place], DO       Future Appointments             In 1 month Johnson, Oralia Rud, DO [facility]

## 2022-01-28 ENCOUNTER — Encounter: Payer: Self-pay | Admitting: Family Medicine

## 2022-01-28 NOTE — Telephone Encounter (Signed)
Pt scheduled 7/18

## 2022-02-02 ENCOUNTER — Ambulatory Visit: Payer: BC Managed Care – PPO | Admitting: Family Medicine

## 2022-02-07 ENCOUNTER — Encounter: Payer: Self-pay | Admitting: Family Medicine

## 2022-02-11 ENCOUNTER — Encounter: Payer: Self-pay | Admitting: Family Medicine

## 2022-02-11 ENCOUNTER — Ambulatory Visit (INDEPENDENT_AMBULATORY_CARE_PROVIDER_SITE_OTHER): Payer: BC Managed Care – PPO | Admitting: Family Medicine

## 2022-02-11 VITALS — BP 112/77 | HR 109 | Temp 98.4°F | Wt 192.0 lb

## 2022-02-11 DIAGNOSIS — Z113 Encounter for screening for infections with a predominantly sexual mode of transmission: Secondary | ICD-10-CM

## 2022-02-11 NOTE — Progress Notes (Signed)
BP 112/77   Pulse (!) 109   Temp 98.4 F (36.9 C)   Wt 192 lb (87.1 kg)   SpO2 98%   BMI 32.46 kg/m    Subjective:    Patient ID: Maria [name], female    DOB: 05-22-1976, 46 y.o.   MRN: [id number]  HPI: Maria [name] is a 46 y.o. female  Chief Complaint  Patient presents with   Exposure to STD    Patient states she is here this morning to be tested for STD's, had intercourse and the condom broke. Patient states she is not having any symptoms.    STD SCREENING Sexual activity:  Recent unprotected sexual encounter Contraception: yes Recent unprotected intercourse: yes History of sexually transmitted diseases: no Previous sexually transmitted disease screening: yes Lifetime sexual partners:  Genital lesions: no Vaginal discharge: no Dysuria: no Swollen lymph nodes: no Fevers: no Rash: no  Relevant past medical, surgical, family and social history reviewed and updated as indicated. Interim medical history since our last visit reviewed. Allergies and medications reviewed and updated.  Review of Systems  Constitutional: Negative.   Respiratory: Negative.    Cardiovascular: Negative.   Gastrointestinal: Negative.   Genitourinary: Negative.   Musculoskeletal: Negative.   Psychiatric/Behavioral: Negative.      Per HPI unless specifically indicated above     Objective:    BP 112/77   Pulse (!) 109   Temp 98.4 F (36.9 C)   Wt 192 lb (87.1 kg)   SpO2 98%   BMI 32.46 kg/m   Wt Readings from Last 3 Encounters:  02/11/22 192 lb (87.1 kg)  12/30/21 193 lb (87.5 kg)  12/01/21 197 lb 9.6 oz (89.6 kg)    Physical Exam Vitals and nursing note reviewed.  Constitutional:      General: She is not in acute distress.    Appearance: Normal appearance. She is obese. She is not ill-appearing, toxic-appearing or diaphoretic.  HENT:     Head: Normocephalic and atraumatic.     Right Ear: External ear normal.     Left Ear: External ear normal.     Nose: Nose  normal.     Mouth/Throat:     Mouth: Mucous membranes are moist.     Pharynx: Oropharynx is clear.  Eyes:     General: No scleral icterus.       Right eye: No discharge.        Left eye: No discharge.     Extraocular Movements: Extraocular movements intact.     Conjunctiva/sclera: Conjunctivae normal.     Pupils: Pupils are equal, round, and reactive to light.  Cardiovascular:     Rate and Rhythm: Normal rate and regular rhythm.     Pulses: Normal pulses.     Heart sounds: Normal heart sounds. No murmur heard.    No friction rub. No gallop.  Pulmonary:     Effort: Pulmonary effort is normal. No respiratory distress.     Breath sounds: Normal breath sounds. No stridor. No wheezing, rhonchi or rales.  Chest:     Chest wall: No tenderness.  Musculoskeletal:        General: Normal range of motion.     Cervical back: Normal range of motion and neck supple.  Skin:    General: Skin is warm and dry.     Capillary Refill: Capillary refill takes less than 2 seconds.     Coloration: Skin is not jaundiced or pale.     Findings: No  bruising, erythema, lesion or rash.  Neurological:     General: No focal deficit present.     Mental Status: She is alert and oriented to person, place, and time. Mental status is at baseline.  Psychiatric:        Mood and Affect: Mood is anxious.        Behavior: Behavior normal.        Thought Content: Thought content normal.        Judgment: Judgment normal.     Results for orders placed or performed in visit on 12/30/21  Comprehensive metabolic panel  Result Value Ref Range   Glucose 130 (H) 70 - 99 mg/dL   BUN 10 6 - 24 mg/dL   Creatinine, Ser 0.71 0.57 - 1.00 mg/dL   eGFR 107 >59 mL/min/1.73   BUN/Creatinine Ratio 14 9 - 23   Sodium 143 134 - 144 mmol/L   Potassium 3.6 3.5 - 5.2 mmol/L   Chloride 105 96 - 106 mmol/L   CO2 20 20 - 29 mmol/L   Calcium 9.7 8.7 - 10.2 mg/dL   Total Protein 7.2 6.0 - 8.5 g/dL   Albumin 4.2 3.8 - 4.8 g/dL    Globulin, Total 3.0 1.5 - 4.5 g/dL   Albumin/Globulin Ratio 1.4 1.2 - 2.2   Bilirubin Total 0.3 0.0 - 1.2 mg/dL   Alkaline Phosphatase 95 44 - 121 IU/L   AST 15 0 - 40 IU/L   ALT 9 0 - 32 IU/L  CBC with Differential/Platelet  Result Value Ref Range   WBC 7.8 3.4 - 10.8 x10E3/uL   RBC 4.84 3.77 - 5.28 x10E6/uL   Hemoglobin 12.8 11.1 - 15.9 g/dL   Hematocrit 40.2 34.0 - 46.6 %   MCV 83 79 - 97 fL   MCH 26.4 (L) 26.6 - 33.0 pg   MCHC 31.8 31.5 - 35.7 g/dL   RDW 13.0 11.7 - 15.4 %   Platelets 360 150 - 450 x10E3/uL   Neutrophils 57 Not Estab. %   Lymphs 34 Not Estab. %   Monocytes 7 Not Estab. %   Eos 1 Not Estab. %   Basos 1 Not Estab. %   Neutrophils Absolute 4.5 1.4 - 7.0 x10E3/uL   Lymphocytes Absolute 2.7 0.7 - 3.1 x10E3/uL   Monocytes Absolute 0.6 0.1 - 0.9 x10E3/uL   EOS (ABSOLUTE) 0.1 0.0 - 0.4 x10E3/uL   Basophils Absolute 0.0 0.0 - 0.2 x10E3/uL   Immature Granulocytes 0 Not Estab. %   Immature Grans (Abs) 0.0 0.0 - 0.1 x10E3/uL  Lipid Panel w/o Chol/HDL Ratio  Result Value Ref Range   Cholesterol, Total 247 (H) 100 - 199 mg/dL   Triglycerides 103 0 - 149 mg/dL   HDL 61 >39 mg/dL   VLDL Cholesterol Cal 18 5 - 40 mg/dL   LDL Chol Calc (NIH) 168 (H) 0 - 99 mg/dL      Assessment & [name]:   Problem List Items Addressed This Visit   None Visit Diagnoses     Routine screening for STI (sexually transmitted infection)    -  Primary   Will screen today and repeat in 6 weeks for HIV and hepatitis. Continue to monitor. Call with any concerns.    Relevant Orders   HIV Antibody (routine testing w rflx)   RPR   GC/Chlamydia Probe Amp   HSV(herpes simplex vrs) 1+2 ab-IgG   Acute Viral Hepatitis (HAV, HBV, HCV)        Follow up [name]: Return in  about 6 weeks (around 03/25/2022) for lab only visit- otherwise as scheduled.

## 2022-02-12 ENCOUNTER — Telehealth: Payer: Self-pay

## 2022-02-12 ENCOUNTER — Encounter: Payer: Self-pay | Admitting: Family Medicine

## 2022-02-12 ENCOUNTER — Ambulatory Visit: Payer: Self-pay | Admitting: *Deleted

## 2022-02-12 LAB — ACUTE VIRAL HEPATITIS (HAV, HBV, HCV)
HCV Ab: NONREACTIVE
Hep A IgM: NEGATIVE
Hep B C IgM: NEGATIVE
Hepatitis B Surface Ag: NEGATIVE

## 2022-02-12 LAB — HCV INTERPRETATION

## 2022-02-12 LAB — HIV ANTIBODY (ROUTINE TESTING W REFLEX): HIV Screen 4th Generation wRfx: NONREACTIVE

## 2022-02-12 LAB — HSV(HERPES SIMPLEX VRS) I + II AB-IGG
HSV 1 Glycoprotein G Ab, IgG: 61.6 index — ABNORMAL HIGH (ref 0.00–0.90)
HSV 2 IgG, Type Spec: 7.85 index — ABNORMAL HIGH (ref 0.00–0.90)

## 2022-02-12 LAB — RPR: RPR Ser Ql: NONREACTIVE

## 2022-02-12 MED ORDER — VALACYCLOVIR HCL 1 G PO TABS: 1000.0000 mg | ORAL_TABLET | Freq: Two times a day (BID) | ORAL | 3 refills | Status: DC

## 2022-02-12 NOTE — Telephone Encounter (Signed)
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 Patient is not having a current outbreak but is nervous and is requesting medication to deal with a flare up when it happens or to prevent it, [place]. Patient is nervous and scared and stated she wanted to end her life because no one will want to be with her with her STD. Advised patient plan of ending her life to Dr. Laural Benes, per provider call 911. Patient states she will not go through with plan and will not hurt herself, would not tell me where she is because she says she is ok. Patient states she has called her mom and she is going to be with her.

## 2022-02-12 NOTE — Progress Notes (Signed)
See TE.

## 2022-02-12 NOTE — Telephone Encounter (Signed)
Pt given lab results per notes of Dr. Laural Benes on 02/12/22. Pt has multiple questions regarding results. Requesting if she can get medication before an outbreak occurs. Patient would like to speak to PCP regarding results. Patient denies any sores / rash or pain in genital area. Please advise . Patient very anxious regarding results.

## 2022-02-12 NOTE — Telephone Encounter (Signed)
See other TE.

## 2022-02-16 LAB — GC/CHLAMYDIA PROBE AMP
Chlamydia trachomatis, NAA: NEGATIVE
Neisseria Gonorrhoeae by PCR: NEGATIVE

## 2022-02-26 ENCOUNTER — Ambulatory Visit (INDEPENDENT_AMBULATORY_CARE_PROVIDER_SITE_OTHER): Payer: BC Managed Care – PPO | Admitting: Family Medicine

## 2022-02-26 ENCOUNTER — Ambulatory Visit: Payer: BC Managed Care – PPO

## 2022-02-26 ENCOUNTER — Encounter: Payer: Self-pay | Admitting: Family Medicine

## 2022-02-26 VITALS — BP 113/75 | HR 101 | Temp 98.1°F | Wt 193.6 lb

## 2022-02-26 DIAGNOSIS — Z3042 Encounter for surveillance of injectable contraceptive: Secondary | ICD-10-CM | POA: Diagnosis not present

## 2022-02-26 DIAGNOSIS — Z8619 Personal history of other infectious and parasitic diseases: Secondary | ICD-10-CM | POA: Insufficient documentation

## 2022-02-26 DIAGNOSIS — F5104 Psychophysiologic insomnia: Secondary | ICD-10-CM | POA: Diagnosis not present

## 2022-02-26 DIAGNOSIS — F331 Major depressive disorder, recurrent, moderate: Secondary | ICD-10-CM

## 2022-02-26 DIAGNOSIS — E1165 Type 2 diabetes mellitus with hyperglycemia: Secondary | ICD-10-CM | POA: Diagnosis not present

## 2022-02-26 LAB — BAYER DCA HB A1C WAIVED: HB A1C (BAYER DCA - WAIVED): 6.5 % — ABNORMAL HIGH (ref 4.8–5.6)

## 2022-02-26 MED ORDER — ZOLPIDEM TARTRATE 5 MG PO TABS: 5.0000 mg | ORAL_TABLET | Freq: Every evening | ORAL | 5 refills | Status: DC | PRN

## 2022-02-26 MED ORDER — MEDROXYPROGESTERONE ACETATE 150 MG/ML IM SUSP
150.0000 mg | Freq: Once | INTRAMUSCULAR | Status: AC
  Administered 2022-02-26: 150 mg via INTRAMUSCULAR

## 2022-02-26 NOTE — Assessment & Plan Note (Signed)
Patient is very upset about this. Information provided as well as education. Call with any concerns.

## 2022-02-26 NOTE — Progress Notes (Signed)
BP 113/75   Pulse (!) 101   Temp 98.1 F (36.7 C) (Oral)   Wt 193 lb 9.6 oz (87.8 kg)   SpO2 99%   BMI 32.73 kg/m    Subjective:    Patient ID: Maria Little, female    DOB: [birth year], 46 y.o.   MRN: [id number]  HPI: [name] is a 46 y.o. female  Chief Complaint  Patient presents with  . Diabetes    Follow up.    ANXIETY/STRESS Duration:{Blank single:19197::"controlled","uncontrolled","better","worse","exacerbated","stable"} Anxious mood: {Blank single:19197::"yes","no"}  Excessive worrying: {Blank single:19197::"yes","no"} Irritability: {Blank single:19197::"yes","no"}  Sweating: {Blank single:19197::"yes","no"} Nausea: {Blank single:19197::"yes","no"} Palpitations:{Blank single:19197::"yes","no"} Hyperventilation: {Blank single:19197::"yes","no"} Panic attacks: {Blank single:19197::"yes","no"} Agoraphobia: {Blank single:19197::"yes","no"}  Obscessions/compulsions: {Blank single:19197::"yes","no"} Depressed mood: {Blank single:19197::"yes","no"}    12/30/2021    8:37 AM 11/25/2021    8:34 AM 08/26/2021    8:20 AM 02/13/2021    8:35 AM 10/15/2020    9:44 AM  Depression screen PHQ 2/9  Decreased Interest 0 0 0 1 0  Down, Depressed, Hopeless 0 0 0 1 1  PHQ - 2 Score 0 0 0 2 1  Altered sleeping 2 2 3 3 3   Tired, decreased energy 2 0 0 0 0  Change in appetite 1 0 2 3 3   Feeling bad or failure about yourself  0 0 0 0 0  Trouble concentrating 0 0 0 0 1  Moving slowly or fidgety/restless 0 0 0 0 0  Suicidal thoughts 0 0 0 0 1  PHQ-9 Score 5 2 5 8 9   Difficult doing work/chores Not difficult at all   Somewhat difficult Somewhat difficult   Anhedonia: {Blank single:19197::"yes","no"} Weight changes: {Blank single:19197::"yes","no"} Insomnia: {Blank single:19197::"yes","no"} {Blank single:19197::"hard to fall asleep","hard to stay asleep"}  Hypersomnia: {Blank single:19197::"yes","no"} Fatigue/loss of energy: {Blank single:19197::"yes","no"} Feelings of  worthlessness: {Blank single:19197::"yes","no"} Feelings of guilt: {Blank single:19197::"yes","no"} Impaired concentration/indecisiveness: {Blank single:19197::"yes","no"} Suicidal ideations: {Blank single:19197::"yes","no"}  Crying spells: {Blank single:19197::"yes","no"} Recent Stressors/Life Changes: {Blank single:19197::"yes","no"}   Relationship problems: {Blank single:19197::"yes","no"}   Family stress: {Blank single:19197::"yes","no"}     Financial stress: {Blank single:19197::"yes","no"}    Job stress: {Blank single:19197::"yes","no"}    Recent death/loss: {Blank single:19197::"yes","no"}  DIABETES Hypoglycemic episodes:no Polydipsia/polyuria: no Visual disturbance: no Chest pain: no Paresthesias: no Glucose Monitoring: no  Accucheck frequency: Not Checking Taking Insulin?: no Blood Pressure Monitoring: not checking Retinal Examination: Up to Date Foot Exam: Up to Date Diabetic Education: Completed Pneumovax: Up to Date Influenza: Up to Date Aspirin: no  Relevant past medical, surgical, family and social history reviewed and updated as indicated. Interim medical history since our last visit reviewed. Allergies and medications reviewed and updated.  Review of Systems  Per HPI unless specifically indicated above     Objective:    BP 113/75   Pulse (!) 101   Temp 98.1 F (36.7 C) (Oral)   Wt 193 lb 9.6 oz (87.8 kg)   SpO2 99%   BMI 32.73 kg/m   Wt Readings from Last 3 Encounters:  02/26/22 193 lb 9.6 oz (87.8 kg)  02/11/22 192 lb (87.1 kg)  12/30/21 193 lb (87.5 kg)    Physical Exam  Results for orders placed or performed in visit on 02/11/22  GC/Chlamydia Probe Amp   Specimen: Urine   UR  Result Value Ref Range   Chlamydia trachomatis, NAA Negative Negative   Neisseria Gonorrhoeae by PCR Negative Negative  HIV Antibody (routine testing w rflx)  Result Value Ref Range   HIV Screen 4th Generation wRfx Non  Reactive Non Reactive  RPR  Result Value  Ref Range   RPR Ser Ql Non Reactive Non Reactive  HSV(herpes simplex vrs) 1+2 ab-IgG  Result Value Ref Range   HSV 1 Glycoprotein G Ab, IgG 61.60 (H) 0.00 - 0.90 index   HSV 2 IgG, Type Spec 7.85 (H) 0.00 - 0.90 index  Acute Viral Hepatitis (HAV, HBV, HCV)  Result Value Ref Range   Hep A IgM Negative Negative   Hepatitis B Surface Ag Negative Negative   Hep B C IgM Negative Negative   HCV Ab Non Reactive Non Reactive  Interpretation:  Result Value Ref Range   HCV Interp 1: Comment       Assessment & Plan:   Problem List Items Addressed This Visit   None    Follow up plan: No follow-ups on file.

## 2022-02-26 NOTE — Assessment & Plan Note (Signed)
Under good control on current regimen. Continue current regimen. Continue to monitor. Call with any concerns. Refills given for 6 months.   

## 2022-02-26 NOTE — Assessment & Plan Note (Signed)
Doing great with A1c of 6.5. Continue current regimen. Continue to monitor. Recheck 6 months.

## 2022-02-28 ENCOUNTER — Encounter: Payer: Self-pay | Admitting: Family Medicine

## 2022-02-28 NOTE — Assessment & Plan Note (Signed)
Stable. Does not want to change medications. Continue to monitor.

## 2022-03-15 ENCOUNTER — Other Ambulatory Visit: Payer: Self-pay | Admitting: Family Medicine

## 2022-03-15 DIAGNOSIS — Z113 Encounter for screening for infections with a predominantly sexual mode of transmission: Secondary | ICD-10-CM

## 2022-03-23 ENCOUNTER — Encounter: Payer: Self-pay | Admitting: Family Medicine

## 2022-03-25 ENCOUNTER — Other Ambulatory Visit: Payer: BC Managed Care – PPO

## 2022-03-28 NOTE — Telephone Encounter (Signed)
6 month supply was sent in August.

## 2022-03-30 NOTE — Telephone Encounter (Signed)
6 months supply sent in in August. Should not be due- please check that pharmacy received medication and let patient know either way

## 2022-04-08 ENCOUNTER — Other Ambulatory Visit: Payer: Self-pay | Admitting: Family Medicine

## 2022-04-09 NOTE — Telephone Encounter (Signed)
Requested Prescriptions  Pending Prescriptions Disp Refills  . sertraline (ZOLOFT) 100 MG tablet [Pharmacy Med Name: Sertraline HCl 100 MG Oral Tablet] 90 tablet 0    Sig: Take 1 tablet by mouth once daily     Psychiatry:  Antidepressants - SSRI - sertraline Passed - 04/08/2022  5:13 PM      Passed - AST in normal range and within 360 days    AST  Date Value Ref Range Status  12/30/2021 15 0 - 40 IU/L Final         Passed - ALT in normal range and within 360 days    ALT  Date Value Ref Range Status  12/30/2021 9 0 - 32 IU/L Final         Passed - Completed PHQ-2 or PHQ-9 in the last 360 days      Passed - Valid encounter within last 6 months    Recent Outpatient Visits          1 month ago Encounter for Depo-Provera contraception   [employer], Megan P, DO   1 month ago Routine screening for STI (sexually transmitted infection)   [place], Megan P, DO   3 months ago Type 2 diabetes mellitus with hyperglycemia, without long-term current use of insulin ([place])   [place], Megan P, DO   4 months ago Other peripheral vertigo, unspecified ear   [place], Megan P, DO   4 months ago Type 2 diabetes mellitus with hyperglycemia, without long-term current use of insulin ([place])   [place], [place], DO      Future Appointments            In 4 months Johnson, Barb Merino, DO [employer], [facility]

## 2022-05-19 ENCOUNTER — Ambulatory Visit (INDEPENDENT_AMBULATORY_CARE_PROVIDER_SITE_OTHER): Payer: BC Managed Care – PPO

## 2022-05-19 DIAGNOSIS — Z3042 Encounter for surveillance of injectable contraceptive: Secondary | ICD-10-CM | POA: Diagnosis not present

## 2022-05-19 MED ORDER — MEDROXYPROGESTERONE ACETATE 150 MG/ML IM SUSP
150.0000 mg | Freq: Once | INTRAMUSCULAR | Status: AC
  Administered 2022-05-19: 150 mg via INTRAMUSCULAR

## 2022-07-10 ENCOUNTER — Other Ambulatory Visit: Payer: Self-pay | Admitting: Family Medicine

## 2022-08-04 ENCOUNTER — Ambulatory Visit: Payer: BC Managed Care – PPO

## 2022-08-05 ENCOUNTER — Ambulatory Visit (INDEPENDENT_AMBULATORY_CARE_PROVIDER_SITE_OTHER): Payer: BC Managed Care – PPO

## 2022-08-05 DIAGNOSIS — Z3042 Encounter for surveillance of injectable contraceptive: Secondary | ICD-10-CM

## 2022-08-05 MED ORDER — MEDROXYPROGESTERONE ACETATE 150 MG/ML IM SUSP
150.0000 mg | Freq: Once | INTRAMUSCULAR | Status: AC
  Administered 2022-08-05: 150 mg via INTRAMUSCULAR

## 2022-09-01 ENCOUNTER — Encounter: Payer: BC Managed Care – PPO | Admitting: Family Medicine

## 2022-09-30 ENCOUNTER — Encounter: Payer: BC Managed Care – PPO | Admitting: Family Medicine

## 2022-10-03 ENCOUNTER — Other Ambulatory Visit: Payer: Self-pay | Admitting: Family Medicine

## 2022-10-05 NOTE — Telephone Encounter (Signed)
Requested Prescriptions  Pending Prescriptions Disp Refills   zolpidem (AMBIEN) 5 MG tablet [Pharmacy Med Name: Zolpidem Tartrate 5 MG Oral Tablet] 30 tablet 0    Sig: TAKE 1 TABLET BY MOUTH AT BEDTIME AS NEEDED FOR SLEEP     Not Delegated - Psychiatry:  Anxiolytics/Hypnotics Failed - 10/03/2022  7:27 PM      Failed - This refill cannot be delegated      Failed - Urine Drug Screen completed in last 360 days      Failed - Valid encounter within last 6 months    Recent Outpatient Visits           7 months ago Encounter for Depo-Provera contraception   [place], [place], DO   7 months ago Routine screening for STI (sexually transmitted infection)   [place], Megan P, DO   9 months ago Type 2 diabetes mellitus with hyperglycemia, without long-term current use of insulin ([place])   [place], Megan P, DO   10 months ago Other peripheral vertigo, unspecified ear   [place], Megan P, DO   10 months ago Type 2 diabetes mellitus with hyperglycemia, without long-term current use of insulin ([place])   [place], [place], DO       Future Appointments             In 3 weeks Johnson, Megan P, DO [place], [facility]             sertraline (ZOLOFT) 100 MG tablet [Pharmacy Med Name: Sertraline HCl 100 MG Oral Tablet] 90 tablet 0    Sig: Take 1 tablet by mouth once daily     Psychiatry:  Antidepressants - SSRI - sertraline Failed - 10/03/2022  7:27 PM      Failed - Valid encounter within last 6 months    Recent Outpatient Visits           7 months ago Encounter for Depo-Provera contraception   [place], Megan P, DO   7 months ago Routine screening for STI (sexually transmitted infection)   [place], Megan P, DO   9 months  ago Type 2 diabetes mellitus with hyperglycemia, without long-term current use of insulin ([place])   [place], Megan P, DO   10 months ago Other peripheral vertigo, unspecified ear   [place], Megan P, DO   10 months ago Type 2 diabetes mellitus with hyperglycemia, without long-term current use of insulin ([place])   [place], [place], DO       Future Appointments             In 3 weeks Wyn[place] Emery, Megan P, DO [place], [facility]            Passed - AST in normal range and within 360 days    AST  Date Value Ref Range Status  12/30/2021 15 0 - 40 IU/L Final         Passed - ALT in normal range and within 360 days    ALT  Date Value Ref Range Status  12/30/2021 9 0 - 32 IU/L Final         Passed - Completed PHQ-2 or PHQ-9 in the last 360 days

## 2022-10-05 NOTE — Telephone Encounter (Signed)
Requested medication (s) are due for refill today: yes  Requested medication (s) are on the active medication list: yes  Last refill:  02/26/22  Future visit scheduled: yes  Notes to clinic:  Unable to refill per protocol, cannot delegate.      Requested Prescriptions  Pending Prescriptions Disp Refills   zolpidem (AMBIEN) 5 MG tablet [Pharmacy Med Name: Zolpidem Tartrate 5 MG Oral Tablet] 30 tablet 0    Sig: TAKE 1 TABLET BY MOUTH AT BEDTIME AS NEEDED FOR SLEEP     Not Delegated - Psychiatry:  Anxiolytics/Hypnotics Failed - 10/03/2022  7:27 PM      Failed - This refill cannot be delegated      Failed - Urine Drug Screen completed in last 360 days      Failed - Valid encounter within last 6 months    Recent Outpatient Visits           7 months ago Encounter for Depo-Provera contraception   [place], [place], DO   7 months ago Routine screening for STI (sexually transmitted infection)   [place], Megan P, DO   9 months ago Type 2 diabetes mellitus with hyperglycemia, without long-term current use of insulin ([place])   [place], Megan P, DO   10 months ago Other peripheral vertigo, unspecified ear   [place], Megan P, DO   10 months ago Type 2 diabetes mellitus with hyperglycemia, without long-term current use of insulin ([place])   [place], [place], DO       Future Appointments             In 3 weeks Johnson, Barb Merino, DO [place], [facility]            Signed Prescriptions Disp Refills   sertraline (ZOLOFT) 100 MG tablet 90 tablet 0    Sig: Take 1 tablet by mouth once daily     Psychiatry:  Antidepressants - SSRI - sertraline Failed - 10/03/2022  7:27 PM      Failed - Valid encounter within last 6 months    Recent Outpatient Visits           7 months ago Encounter for  Depo-Provera contraception   [place], Megan P, DO   7 months ago Routine screening for STI (sexually transmitted infection)   [place], Megan P, DO   9 months ago Type 2 diabetes mellitus with hyperglycemia, without long-term current use of insulin ([place])   [place], Megan P, DO   10 months ago Other peripheral vertigo, unspecified ear   [place], Megan P, DO   10 months ago Type 2 diabetes mellitus with hyperglycemia, without long-term current use of insulin [facility])   [place], [place], DO       Future Appointments             In 3 weeks Wynetta Emery, Megan P, DO [place], [facility]            Passed - AST in normal range and within 360 days    AST  Date Value Ref Range Status  12/30/2021 15 0 - 40 IU/L Final         Passed - ALT in  normal range and within 360 days    ALT  Date Value Ref Range Status  12/30/2021 9 0 - 32 IU/L Final         Passed - Completed PHQ-2 or PHQ-9 in the last 360 days

## 2022-10-25 ENCOUNTER — Ambulatory Visit (INDEPENDENT_AMBULATORY_CARE_PROVIDER_SITE_OTHER): Payer: BC Managed Care – PPO

## 2022-10-25 DIAGNOSIS — Z3042 Encounter for surveillance of injectable contraceptive: Secondary | ICD-10-CM | POA: Diagnosis not present

## 2022-10-25 MED ORDER — MEDROXYPROGESTERONE ACETATE 150 MG/ML IM SUSY
150.0000 mg | PREFILLED_SYRINGE | Freq: Once | INTRAMUSCULAR | Status: AC
  Administered 2022-10-25: 150 mg via INTRAMUSCULAR

## 2022-10-29 ENCOUNTER — Encounter: Payer: Self-pay | Admitting: Family Medicine

## 2022-10-29 ENCOUNTER — Other Ambulatory Visit (HOSPITAL_COMMUNITY)
Admission: RE | Admit: 2022-10-29 | Discharge: 2022-10-29 | Disposition: A | Discharge status: Home / Self Care | Payer: BC Managed Care – PPO | Source: Ambulatory Visit | Attending: Family Medicine | Admitting: Family Medicine

## 2022-10-29 ENCOUNTER — Ambulatory Visit (INDEPENDENT_AMBULATORY_CARE_PROVIDER_SITE_OTHER): Payer: BC Managed Care – PPO | Admitting: Family Medicine

## 2022-10-29 VITALS — BP 132/87 | HR 101 | Temp 98.8°F | Ht 65.5 in | Wt 195.6 lb

## 2022-10-29 DIAGNOSIS — E782 Mixed hyperlipidemia: Secondary | ICD-10-CM | POA: Diagnosis not present

## 2022-10-29 DIAGNOSIS — Z1211 Encounter for screening for malignant neoplasm of colon: Secondary | ICD-10-CM | POA: Diagnosis not present

## 2022-10-29 DIAGNOSIS — E1165 Type 2 diabetes mellitus with hyperglycemia: Secondary | ICD-10-CM | POA: Diagnosis not present

## 2022-10-29 DIAGNOSIS — Z0000 Encounter for general adult medical examination without abnormal findings: Secondary | ICD-10-CM | POA: Diagnosis not present

## 2022-10-29 DIAGNOSIS — K219 Gastro-esophageal reflux disease without esophagitis: Secondary | ICD-10-CM

## 2022-10-29 DIAGNOSIS — F331 Major depressive disorder, recurrent, moderate: Secondary | ICD-10-CM

## 2022-10-29 DIAGNOSIS — F5104 Psychophysiologic insomnia: Secondary | ICD-10-CM

## 2022-10-29 DIAGNOSIS — Z1231 Encounter for screening mammogram for malignant neoplasm of breast: Secondary | ICD-10-CM

## 2022-10-29 LAB — MICROALBUMIN, URINE WAIVED
Creatinine, Urine Waived: 200 mg/dL (ref 10–300)
Microalb, Ur Waived: 80 mg/L — ABNORMAL HIGH (ref 0–19)

## 2022-10-29 LAB — URINALYSIS, ROUTINE W REFLEX MICROSCOPIC
Bilirubin, UA: NEGATIVE
Ketones, UA: NEGATIVE
Leukocytes,UA: NEGATIVE
Nitrite, UA: NEGATIVE
Protein,UA: NEGATIVE
RBC, UA: NEGATIVE
Specific Gravity, UA: >1.03 — ABNORMAL HIGH (ref 1.005–1.030)
Urobilinogen, Ur: 1 mg/dL (ref 0.2–1.0)
pH, UA: 5.5 (ref 5.0–7.5)

## 2022-10-29 LAB — BAYER DCA HB A1C WAIVED: HB A1C (BAYER DCA - WAIVED): 7.3 % — ABNORMAL HIGH (ref 4.8–5.6)

## 2022-10-29 MED ORDER — METFORMIN HCL ER 500 MG PO TB24: ORAL_TABLET | ORAL | 1 refills | Status: DC

## 2022-10-29 MED ORDER — ZOLPIDEM TARTRATE 5 MG PO TABS: 5.0000 mg | ORAL_TABLET | Freq: Every evening | ORAL | 5 refills | Status: DC | PRN

## 2022-10-29 MED ORDER — VALACYCLOVIR HCL 1 G PO TABS: 1000.0000 mg | ORAL_TABLET | Freq: Two times a day (BID) | ORAL | 3 refills | Status: DC

## 2022-10-29 MED ORDER — SERTRALINE HCL 100 MG PO TABS: 100.0000 mg | ORAL_TABLET | Freq: Every day | ORAL | 1 refills | Status: DC

## 2022-10-29 NOTE — Assessment & Plan Note (Signed)
Under good control on current regimen. Continue current regimen. Continue to monitor. Call with any concerns. Refills given. Labs drawn today.   

## 2022-10-29 NOTE — Assessment & Plan Note (Signed)
Under good control on current regimen. Continue current regimen. Continue to monitor. Call with any concerns. Refills given for 6 months.   

## 2022-10-29 NOTE — Patient Instructions (Addendum)
Patient is scheduled at [facility] on May 8th at 1:00 PM. If patient has any questions in regards to her scheduled appointment. Patient can reach out to their facility directly at [phone].

## 2022-10-29 NOTE — Assessment & Plan Note (Signed)
Elevated with A1c of 7.3, up from 6.5. Will increase her metformin to 1000mg  BID and recheck 3 months. Call with any concerns.

## 2022-10-29 NOTE — Assessment & Plan Note (Signed)
Under good control on current regimen. Continue current regimen. Continue to monitor. Call with any concerns. Refills given.   

## 2022-10-29 NOTE — Progress Notes (Signed)
BP 132/87   Pulse (!) 101   Temp 98.8 F (37.1 C) (Oral)   Ht 5' 5.5" (1.664 m)   Wt 195 lb 9.6 oz (88.7 kg)   SpO2 96%   BMI 32.05 kg/m    Subjective:    Patient ID: Maria Little, female    DOB: 1975/08/16, 47 y.o.   MRN: [id number]  HPI: Maria Little is a 47 y.o. female presenting on 10/29/2022 for comprehensive medical examination. Current medical complaints include:  DIABETES Hypoglycemic episodes:no Polydipsia/polyuria: yes Visual disturbance: no Chest pain: no Paresthesias: no Glucose Monitoring: no  Accucheck frequency: Not Checking Taking Insulin?: no Blood Pressure Monitoring: not checking Retinal Examination: Up to Date Foot Exam: Up to Date Diabetic Education: Completed Pneumovax: Up to Date Influenza: Not up to Date Aspirin: yes  HYPERTENSION / HYPERLIPIDEMIA Satisfied with current treatment? yes Duration of hypertension: chronic BP monitoring frequency: not checking BP medication side effects: not on anything Past BP meds: none Duration of hyperlipidemia: chronic Cholesterol medication side effects: no Cholesterol supplements: none Past cholesterol medications: none Medication compliance: excellent compliance Aspirin: no Recent stressors: no Recurrent headaches: no Visual changes: no Palpitations: no Dyspnea: no Chest pain: no Lower extremity edema: no Dizzy/lightheaded: no  DEPRESSION Mood status: stable Satisfied with current treatment?: yes Symptom severity: mild  Duration of current treatment : chronic Side effects: no Medication compliance: excellent compliance Psychotherapy/counseling: no  Previous psychiatric medications: sertraline Depressed mood: no Anxious mood: no Anhedonia: no Significant weight loss or gain: no Insomnia: yes  Fatigue: no Feelings of worthlessness or guilt: no Impaired concentration/indecisiveness: no Suicidal ideations: no Hopelessness: no Crying spells: no    10/29/2022    2:23 PM  02/26/2022    4:55 PM 12/30/2021    8:37 AM 11/25/2021    8:34 AM 08/26/2021    8:20 AM  Depression screen PHQ 2/9  Decreased Interest 0 0 0 0 0  Down, Depressed, Hopeless 0 0 0 0 0  PHQ - 2 Score 0 0 0 0 0  Altered sleeping Tired, decreased energy 0 0 2 0 0  Change in appetite 0 0 1 0 2  Feeling bad or failure about yourself  0 0 0 0 0  Trouble concentrating 0 0 0 0 0  Moving slowly or fidgety/restless 0 0 0 0 0  Suicidal thoughts 0 0 0 0 0  PHQ-9 Score Difficult doing work/chores  Not difficult at all Not difficult at all      INSOMNIA Duration: chronic Satisfied with sleep quality: yes Difficulty falling asleep: no Difficulty staying asleep: no Waking a few hours after sleep onset: no Early morning awakenings: no Daytime hypersomnolence: no Wakes feeling refreshed: yes Good sleep hygiene: yes Apnea: no Snoring: no Depressed/anxious mood: yes Recent stress: no Restless legs/nocturnal leg cramps: no Chronic pain/arthritis: no History of sleep study: no Treatments attempted: melatonin, uinsom, benadryl, and ambien    Menopausal Symptoms: no  Depression Screen done today and results listed below:     10/29/2022    2:23 PM 02/26/2022    4:55 PM 12/30/2021    8:37 AM 11/25/2021    8:34 AM 08/26/2021    8:20 AM  Depression screen PHQ 2/9  Decreased Interest 0 0 0 0 0  Down, Depressed, Hopeless 0 0 0 0 0  PHQ - 2 Score 0 0 0 0 0  Altered sleeping Tired,  decreased energy 0 0 2 0 0  Change in appetite 0 0 1 0 2  Feeling bad or failure about yourself  0 0 0 0 0  Trouble concentrating 0 0 0 0 0  Moving slowly or fidgety/restless 0 0 0 0 0  Suicidal thoughts 0 0 0 0 0  PHQ-9 Score 2 1 5 2 5   Difficult doing work/chores  Not difficult at all Not difficult at all      Past Medical History:  Past Medical History:  Diagnosis Date   Diabetes mellitus without complication    Insomnia    Migraine     Surgical History:  Past Surgical  History:  Procedure Laterality Date   BREAST SURGERY     Lumpectomy    Medications:  Current Outpatient Medications on File Prior to Visit  Medication Sig   medroxyPROGESTERone (DEPO-PROVERA) 150 MG/ML injection Inject 150 mg into the muscle every 3 (three) months.   omeprazole (PRILOSEC) 20 MG capsule Take 20 mg by mouth daily.   No current facility-administered medications on file prior to visit.    Allergies:  No Known Allergies  Social History:  Social History   Socioeconomic History   Marital status: Single    Spouse name: Not on file   Number of children: Not on file   Years of education: Not on file   Highest education level: Not on file  Occupational History   Not on file  Tobacco Use   Smoking status: Never   Smokeless tobacco: Never  Vaping Use   Vaping Use: Never used  Substance and Sexual Activity   Alcohol use: Yes    Alcohol/week: 2.0 standard drinks of alcohol    Types: 2 Glasses of wine per week   Drug use: No   Sexual activity: Never  Other Topics Concern   Not on file  Social History Narrative   Not on file   Social Determinants of Health   Financial Resource Strain: Not on file  Food Insecurity: Not on file  Transportation Needs: Not on file  Physical Activity: Not on file  Stress: Not on file  Social Connections: Not on file  Intimate Partner Violence: Not on file   Social History   Tobacco Use  Smoking Status Never  Smokeless Tobacco Never   Social History   Substance and Sexual Activity  Alcohol Use Yes   Alcohol/week: 2.0 standard drinks of alcohol   Types: 2 Glasses of wine per week    Family History:  Family History  Problem Relation Age of Onset   Diabetes Mother    Hyperlipidemia Mother    Hypertension Mother    Hypertension Father    Hypertension Brother     Past medical history, surgical history, medications, allergies, family history and social history reviewed with patient today and changes made to  appropriate areas of the chart.   Review of Systems  Constitutional: Negative.   HENT: Negative.    Eyes: Negative.   Respiratory: Negative.    Cardiovascular: Negative.   Gastrointestinal: Negative.   Genitourinary: Negative.   Musculoskeletal: Negative.   Skin: Negative.   Neurological:  Positive for dizziness. Negative for tingling, tremors, sensory change, speech change, focal weakness, seizures, loss of consciousness, weakness and headaches.  Endo/Heme/Allergies:  Positive for polydipsia. Negative for environmental allergies. Does not bruise/bleed easily.  Psychiatric/Behavioral: Negative.     All other ROS negative except what is listed above and in the HPI.      Objective:  BP 132/87   Pulse (!) 101   Temp 98.8 F (37.1 C) (Oral)   Ht 5' 5.5" (1.664 m)   Wt 195 lb 9.6 oz (88.7 kg)   SpO2 96%   BMI 32.05 kg/m   Wt Readings from Last 3 Encounters:  10/29/22 195 lb 9.6 oz (88.7 kg)  02/26/22 193 lb 9.6 oz (87.8 kg)  02/11/22 192 lb (87.1 kg)    Physical Exam Vitals and nursing note reviewed. Exam conducted with a chaperone present.  Constitutional:      General: She is not in acute distress.    Appearance: Normal appearance. She is not ill-appearing, toxic-appearing or diaphoretic.  HENT:     Head: Normocephalic and atraumatic.     Right Ear: Tympanic membrane, ear canal and external ear normal. There is no impacted cerumen.     Left Ear: Tympanic membrane, ear canal and external ear normal. There is no impacted cerumen.     Nose: Nose normal. No congestion or rhinorrhea.     Mouth/Throat:     Mouth: Mucous membranes are moist.     Pharynx: Oropharynx is clear. No oropharyngeal exudate or posterior oropharyngeal erythema.  Eyes:     General: No scleral icterus.       Right eye: No discharge.        Left eye: No discharge.     Extraocular Movements: Extraocular movements intact.     Conjunctiva/sclera: Conjunctivae normal.     Pupils: Pupils are equal,  round, and reactive to light.  Neck:     Vascular: No carotid bruit.  Cardiovascular:     Rate and Rhythm: Normal rate and regular rhythm.     Pulses: Normal pulses.     Heart sounds: No murmur heard.    No friction rub. No gallop.  Pulmonary:     Effort: Pulmonary effort is normal. No respiratory distress.     Breath sounds: Normal breath sounds. No stridor. No wheezing, rhonchi or rales.  Chest:     Chest wall: No tenderness.  Abdominal:     General: Abdomen is flat. Bowel sounds are normal. There is no distension.     Palpations: Abdomen is soft. There is no mass.     Tenderness: There is no abdominal tenderness. There is no right CVA tenderness, left CVA tenderness, guarding or rebound.     Hernia: No hernia is present. There is no hernia in the left inguinal area or right inguinal area.  Genitourinary:    Labia:        Right: No rash, tenderness, lesion or injury.        Left: No rash, tenderness, lesion or injury.      Vagina: Normal.     Cervix: Normal.     Uterus: Normal.      Adnexa: Right adnexa normal and left adnexa normal.     Comments: Breast exams deferred with shared decision making Musculoskeletal:        General: No swelling, tenderness, deformity or signs of injury.     Cervical back: Normal range of motion and neck supple. No rigidity. No muscular tenderness.     Right lower leg: No edema.     Left lower leg: No edema.  Lymphadenopathy:     Cervical: No cervical adenopathy.     Lower Body: No right inguinal adenopathy. No left inguinal adenopathy.  Skin:    General: Skin is warm and dry.     Capillary Refill: Capillary refill takes less  than 2 seconds.     Coloration: Skin is not jaundiced or pale.     Findings: No bruising, erythema, lesion or rash.  Neurological:     General: No focal deficit present.     Mental Status: She is alert and oriented to person, place, and time. Mental status is at baseline.     Cranial Nerves: No cranial nerve deficit.      Sensory: No sensory deficit.     Motor: No weakness.     Coordination: Coordination normal.     Gait: Gait normal.     Deep Tendon Reflexes: Reflexes normal.  Psychiatric:        Mood and Affect: Mood normal.        Behavior: Behavior normal.        Thought Content: Thought content normal.        Judgment: Judgment normal.     Results for orders placed or performed in visit on 02/26/22  Bayer DCA Hb A1c Waived  Result Value Ref Range   HB A1C (BAYER DCA - WAIVED) 6.5 (H) 4.8 - 5.6 %      Assessment & Plan:   Problem List Items Addressed This Visit       Digestive   Gastroesophageal reflux disease    Under good control on current regimen. Continue current regimen. Continue to monitor. Call with any concerns. Refills given. Labs drawn today.        Relevant Medications   omeprazole (PRILOSEC) 20 MG capsule     Endocrine   Type 2 diabetes mellitus with hyperglycemia, without long-term current use of insulin    Elevated with A1c of 7.3, up from 6.5. Will increase her metformin to  BID and recheck 3 months. Call with any concerns.       Relevant Medications   metFORMIN (GLUCOPHAGE-XR) 500 MG 24 hr tablet   Other Relevant Orders   CBC with Differential/Platelet   Bayer DCA Hb A1c Waived   Microalbumin, Urine Waived     Other   Depression, major, recurrent, moderate    Under good control on current regimen. Continue current regimen. Continue to monitor. Call with any concerns. Refills given.        Relevant Medications   sertraline (ZOLOFT) 100 MG tablet   Other Relevant Orders   CBC with Differential/Platelet   Hyperlipidemia    Under good control on current regimen. Continue current regimen. Continue to monitor. Call with any concerns. Refills given. Labs drawn today.       Relevant Orders   CBC with Differential/Platelet   Comprehensive metabolic panel   Lipid Panel w/o Chol/HDL Ratio   Psychophysiological insomnia    Under good control on current  regimen. Continue current regimen. Continue to monitor. Call with any concerns. Refills given for 6 months.       Other Visit Diagnoses     Routine general medical examination at a health care facility    -  Primary   Vaccines up to date. Screening labs checked today. Pap done. Mammo and colonoscopy ordered today. Continue diet and exercise. Call with any concerns.   Relevant Orders   CBC with Differential/Platelet   Comprehensive metabolic panel   Lipid Panel w/o Chol/HDL Ratio   Cytology - PAP   Urinalysis, Routine w reflex microscopic   TSH   Bayer DCA Hb A1c Waived   Microalbumin, Urine Waived   Screening for colon cancer       Referral to GI placed today.  Relevant Orders   Ambulatory referral to Gastroenterology   Encounter for screening mammogram for malignant neoplasm of breast       Mammogram ordered today   Relevant Orders   MM 3D SCREENING MAMMOGRAM BILATERAL BREAST        Follow up plan: Return in about 3 months (around 01/28/2023).   LABORATORY TESTING:  - Pap smear: pap done  IMMUNIZATIONS:   - Tdap: Tetanus vaccination status reviewed: last tetanus booster within 10 years. - Influenza: Postponed to flu season - Pneumovax: Up to date - Prevnar: Not applicable - COVID: Up to date - HPV: Not applicable - Shingrix vaccine: Not applicable  SCREENING: -Mammogram: Ordered today  - Colonoscopy: Ordered today   PATIENT COUNSELING:   Advised to take 1 mg of folate supplement per day if capable of pregnancy.   Sexuality: Discussed sexually transmitted diseases, partner selection, use of condoms, avoidance of unintended pregnancy  and contraceptive alternatives.   Advised to avoid cigarette smoking.  I discussed with the patient that most people either abstain from alcohol or drink within safe limits (<=14/week and <=4 drinks/occasion for males, <=7/weeks and <= 3 drinks/occasion for females) and that the risk for alcohol disorders and other health effects  rises proportionally with the number of drinks per week and how often a drinker exceeds daily limits.  Discussed cessation/primary prevention of drug use and availability of treatment for abuse.   Diet: Encouraged to adjust caloric intake to maintain  or achieve ideal body weight, to reduce intake of dietary saturated fat and total fat, to limit sodium intake by avoiding high sodium foods and not adding table salt, and to maintain adequate dietary potassium and calcium preferably from fresh fruits, vegetables, and low-fat dairy products.    stressed the importance of regular exercise  Injury prevention: Discussed safety belts, safety helmets, smoke detector, smoking near bedding or upholstery.   Dental health: Discussed importance of regular tooth brushing, flossing, and dental visits.    NEXT PREVENTATIVE PHYSICAL DUE IN 1 YEAR. Return in about 3 months (around 01/28/2023).

## 2022-10-30 LAB — CBC WITH DIFFERENTIAL/PLATELET
Basophils Absolute: 0 10*3/uL (ref 0.0–0.2)
Basos: 1 %
EOS (ABSOLUTE): 0.1 10*3/uL (ref 0.0–0.4)
Eos: 1 %
Hematocrit: 38.1 % (ref 34.0–46.6)
Hemoglobin: 11.8 g/dL (ref 11.1–15.9)
Immature Grans (Abs): 0 10*3/uL (ref 0.0–0.1)
Immature Granulocytes: 0 %
Lymphocytes Absolute: 3.3 10*3/uL — ABNORMAL HIGH (ref 0.7–3.1)
Lymphs: 41 %
MCH: 25.6 pg — ABNORMAL LOW (ref 26.6–33.0)
MCHC: 31 g/dL — ABNORMAL LOW (ref 31.5–35.7)
MCV: 83 fL (ref 79–97)
Monocytes Absolute: 0.5 10*3/uL (ref 0.1–0.9)
Monocytes: 7 %
Neutrophils Absolute: 4.1 10*3/uL (ref 1.4–7.0)
Neutrophils: 50 %
Platelets: 335 10*3/uL (ref 150–450)
RBC: 4.61 x10E6/uL (ref 3.77–5.28)
RDW: 13.8 % (ref 11.7–15.4)
WBC: 8 10*3/uL (ref 3.4–10.8)

## 2022-10-30 LAB — COMPREHENSIVE METABOLIC PANEL
ALT: 13 IU/L (ref 0–32)
AST: 14 IU/L (ref 0–40)
Albumin/Globulin Ratio: 1.6 (ref 1.2–2.2)
Albumin: 3.8 g/dL — ABNORMAL LOW (ref 3.9–4.9)
Alkaline Phosphatase: 105 IU/L (ref 44–121)
BUN/Creatinine Ratio: 7 — ABNORMAL LOW (ref 9–23)
BUN: 6 mg/dL (ref 6–24)
Bilirubin Total: 0.3 mg/dL (ref 0.0–1.2)
CO2: 17 mmol/L — ABNORMAL LOW (ref 20–29)
Calcium: 8.5 mg/dL — ABNORMAL LOW (ref 8.7–10.2)
Chloride: 100 mmol/L (ref 96–106)
Creatinine, Ser: 0.82 mg/dL (ref 0.57–1.00)
Globulin, Total: 2.4 g/dL (ref 1.5–4.5)
Glucose: 221 mg/dL — ABNORMAL HIGH (ref 70–99)
Potassium: 3.7 mmol/L (ref 3.5–5.2)
Sodium: 136 mmol/L (ref 134–144)
Total Protein: 6.2 g/dL (ref 6.0–8.5)
eGFR: 90 mL/min/{1.73_m2} (ref >59)

## 2022-10-30 LAB — LIPID PANEL W/O CHOL/HDL RATIO
Cholesterol, Total: 228 mg/dL — ABNORMAL HIGH (ref 100–199)
HDL: 69 mg/dL (ref >39)
LDL Chol Calc (NIH): 133 mg/dL — ABNORMAL HIGH (ref 0–99)
Triglycerides: 147 mg/dL (ref 0–149)
VLDL Cholesterol Cal: 26 mg/dL (ref 5–40)

## 2022-10-30 LAB — TSH: TSH: 1.21 u[IU]/mL (ref 0.450–4.500)

## 2022-11-04 LAB — CYTOLOGY - PAP
Comment: NEGATIVE
Diagnosis: NEGATIVE
Diagnosis: REACTIVE
High risk HPV: NEGATIVE

## 2022-11-09 ENCOUNTER — Encounter: Payer: Self-pay | Admitting: *Deleted

## 2022-11-24 ENCOUNTER — Ambulatory Visit
Admission: RE | Admit: 2022-11-24 | Discharge: 2022-11-24 | Disposition: A | Discharge status: Home / Self Care | Payer: BC Managed Care – PPO | Source: Ambulatory Visit | Attending: Family Medicine | Admitting: Family Medicine

## 2022-11-24 DIAGNOSIS — Z1231 Encounter for screening mammogram for malignant neoplasm of breast: Secondary | ICD-10-CM | POA: Diagnosis not present

## 2022-11-25 ENCOUNTER — Inpatient Hospital Stay
Admission: RE | Admit: 2022-11-25 | Discharge: 2022-11-25 | Disposition: A | Discharge status: Home / Self Care | Payer: Self-pay | Source: Ambulatory Visit | Attending: Family Medicine | Admitting: Family Medicine

## 2022-11-25 ENCOUNTER — Other Ambulatory Visit: Payer: Self-pay | Admitting: *Deleted

## 2022-11-25 DIAGNOSIS — Z1231 Encounter for screening mammogram for malignant neoplasm of breast: Secondary | ICD-10-CM

## 2023-01-10 ENCOUNTER — Ambulatory Visit (INDEPENDENT_AMBULATORY_CARE_PROVIDER_SITE_OTHER): Payer: BC Managed Care – PPO

## 2023-01-10 DIAGNOSIS — Z3042 Encounter for surveillance of injectable contraceptive: Secondary | ICD-10-CM

## 2023-01-10 MED ORDER — MEDROXYPROGESTERONE ACETATE 150 MG/ML IM SUSY
PREFILLED_SYRINGE | INTRAMUSCULAR | Status: AC
Start: 2023-01-10 — End: 2023-06-13

## 2023-02-10 ENCOUNTER — Encounter: Payer: Self-pay | Admitting: Family Medicine

## 2023-02-10 NOTE — Telephone Encounter (Signed)
Needs to be seen. Does anyone have any availability?

## 2023-02-14 ENCOUNTER — Ambulatory Visit: Payer: BC Managed Care – PPO | Admitting: Family Medicine

## 2023-02-14 ENCOUNTER — Encounter: Payer: Self-pay | Admitting: Family Medicine

## 2023-02-14 VITALS — BP 131/87 | HR 88 | Temp 98.2°F | Wt 194.2 lb

## 2023-02-14 DIAGNOSIS — E1165 Type 2 diabetes mellitus with hyperglycemia: Secondary | ICD-10-CM | POA: Diagnosis not present

## 2023-02-14 DIAGNOSIS — R55 Syncope and collapse: Secondary | ICD-10-CM

## 2023-02-14 LAB — BAYER DCA HB A1C WAIVED: HB A1C (BAYER DCA - WAIVED): 8.6 % — ABNORMAL HIGH (ref 4.8–5.6)

## 2023-02-14 NOTE — Assessment & Plan Note (Signed)
Not doing well with A1c of 8.6. Not taking her metformin as prescribed. Will add ozempic and recheck as scheduled. Call with any concerns.

## 2023-02-14 NOTE — Progress Notes (Signed)
BP 131/87   Pulse 88   Temp 98.2 F (36.8 C) (Oral)   Wt 194 lb 3.2 oz (88.1 kg)   SpO2 100%   BMI 31.83 kg/m    Subjective:    Patient ID: Maria Little, female    DOB: 12-13-1975, 47 y.o.   MRN: [id number]  HPI: Maria Little is a 47 y.o. female  Chief Complaint  Patient presents with   Diabetes   [place]- had family reunion and was working. Blacked out while donating plasma. Has been very tired Duration: about a week Description of symptoms: very tired, blacked out x1 Duration of episode: couple of seconds Dizziness frequency: no dizziness Aggravating factors:  running around and donating plasma Triggered by bending over: no Aggravated by head movement: no Aggravated by exertion, coughing, loud noises: no Recent head injury: no Recent or current viral symptoms: no History of vasovagal episodes: yes Nausea: no Vomiting: no Tinnitus: no Hearing loss: no Aural fullness: no Headache: no Photophobia/phonophobia: no Unsteady gait: no Postural instability: no Diplopia, dysarthria, dysphagia or weakness: no Related to exertion: yes Pallor: no Diaphoresis: no Dyspnea: no Chest pain: no  DIABETES Hypoglycemic episodes:no Polydipsia/polyuria: no Visual disturbance: no Chest pain: no Paresthesias: no Glucose Monitoring: no  Accucheck frequency: Not Checking Taking Insulin?: no Blood Pressure Monitoring: not checking Retinal Examination: Not up to Date Foot Exam: Up to Date Diabetic Education: Completed Pneumovax: Up to Date Influenza: Up to Date Aspirin: no  Relevant past medical, surgical, family and social history reviewed and updated as indicated. Interim medical history since our last visit reviewed. Allergies and medications reviewed and updated.  Review of Systems  Constitutional:  Positive for fatigue. Negative for activity change, appetite change, chills, diaphoresis, fever and unexpected weight change.  Respiratory: Negative.     Cardiovascular: Negative.   Gastrointestinal: Negative.   Musculoskeletal: Negative.   Neurological:  Positive for syncope. Negative for dizziness, tremors, seizures, facial asymmetry, speech difficulty, weakness, light-headedness, numbness and headaches.  Psychiatric/Behavioral: Negative.      Per HPI unless specifically indicated above     Objective:    BP 131/87   Pulse 88   Temp 98.2 F (36.8 C) (Oral)   Wt 194 lb 3.2 oz (88.1 kg)   SpO2 100%   BMI 31.83 kg/m   Wt Readings from Last 3 Encounters:  02/14/23 194 lb 3.2 oz (88.1 kg)  10/29/22 195 lb 9.6 oz (88.7 kg)  02/26/22 193 lb 9.6 oz (87.8 kg)    Physical Exam Vitals and nursing note reviewed.  Constitutional:      General: She is not in acute distress.    Appearance: Normal appearance. She is not ill-appearing, toxic-appearing or diaphoretic.  HENT:     Head: Normocephalic and atraumatic.     Right Ear: External ear normal.     Left Ear: External ear normal.     Nose: Nose normal.     Mouth/Throat:     Mouth: Mucous membranes are moist.     Pharynx: Oropharynx is clear.  Eyes:     General: No scleral icterus.       Right eye: No discharge.        Left eye: No discharge.     Extraocular Movements: Extraocular movements intact.     Conjunctiva/sclera: Conjunctivae normal.     Pupils: Pupils are equal, round, and reactive to light.  Cardiovascular:     Rate and Rhythm: Normal rate and regular rhythm.     Pulses:  Normal pulses.     Heart sounds: Normal heart sounds. No murmur heard.    No friction rub. No gallop.  Pulmonary:     Effort: Pulmonary effort is normal. No respiratory distress.     Breath sounds: Normal breath sounds. No stridor. No wheezing, rhonchi or rales.  Chest:     Chest wall: No tenderness.  Musculoskeletal:        General: Normal range of motion.     Cervical back: Normal range of motion and neck supple.  Skin:    General: Skin is warm and dry.     Capillary Refill: Capillary  refill takes less than 2 seconds.     Coloration: Skin is not jaundiced or pale.     Findings: No bruising, erythema, lesion or rash.  Neurological:     General: No focal deficit present.     Mental Status: She is alert and oriented to person, place, and time. Mental status is at baseline.  Psychiatric:        Mood and Affect: Mood normal.        Behavior: Behavior normal.        Thought Content: Thought content normal.        Judgment: Judgment normal.     Results for orders placed or performed in visit on 10/29/22  CBC with Differential/Platelet  Result Value Ref Range   WBC 8.0 3.4 - 10.8 x10E3/uL   RBC 4.61 3.77 - 5.28 x10E6/uL   Hemoglobin 11.8 11.1 - 15.9 g/dL   Hematocrit 29.5 62.1 - 46.6 %   MCV 83 79 - 97 fL   MCH 25.6 (L) 26.6 - 33.0 pg   MCHC 31.0 (L) 31.5 - 35.7 g/dL   RDW 30.8 65.7 - 84.6 %   Platelets 335 150 - 450 x10E3/uL   Neutrophils 50 Not Estab. %   Lymphs 41 Not Estab. %   Monocytes 7 Not Estab. %   Eos 1 Not Estab. %   Basos 1 Not Estab. %   Neutrophils Absolute 4.1 1.4 - 7.0 x10E3/uL   Lymphocytes Absolute 3.3 (H) 0.7 - 3.1 x10E3/uL   Monocytes Absolute 0.5 0.1 - 0.9 x10E3/uL   EOS (ABSOLUTE) 0.1 0.0 - 0.4 x10E3/uL   Basophils Absolute 0.0 0.0 - 0.2 x10E3/uL   Immature Granulocytes 0 Not Estab. %   Immature Grans (Abs) 0.0 0.0 - 0.1 x10E3/uL  Comprehensive metabolic panel  Result Value Ref Range   Glucose 221 (H) 70 - 99 mg/dL   BUN 6 6 - 24 mg/dL   Creatinine, Ser 9.62 0.57 - 1.00 mg/dL   eGFR 90 >95 MW/UXL/2.44   BUN/Creatinine Ratio 7 (L) 9 - 23   Sodium 136 134 - 144 mmol/L   Potassium 3.7 3.5 - 5.2 mmol/L   Chloride 100 96 - 106 mmol/L   CO2 17 (L) 20 - 29 mmol/L   Calcium 8.5 (L) 8.7 - 10.2 mg/dL   Total Protein 6.2 6.0 - 8.5 g/dL   Albumin 3.8 (L) 3.9 - 4.9 g/dL   Globulin, Total 2.4 1.5 - 4.5 g/dL   Albumin/Globulin Ratio 1.6 1.2 - 2.2   Bilirubin Total 0.3 0.0 - 1.2 mg/dL   Alkaline Phosphatase 105 44 - 121 IU/L   AST 14 0 - 40  IU/L   ALT 13 0 - 32 IU/L  Lipid Panel w/o Chol/HDL Ratio  Result Value Ref Range   Cholesterol, Total 228 (H) 100 - 199 mg/dL   Triglycerides 010 0 - 149  mg/dL   HDL 69 >96 mg/dL   VLDL Cholesterol Cal 26 5 - 40 mg/dL   LDL Chol Calc (NIH) 045 (H) 0 - 99 mg/dL  Urinalysis, Routine w reflex microscopic  Result Value Ref Range   Specific Gravity, UA >1.030 (H) 1.005 - 1.030   pH, UA 5.5 5.0 - 7.5   Color, UA Yellow Yellow   Appearance Ur Clear Clear   Leukocytes,UA Negative Negative   Protein,UA Negative Negative/Trace   Glucose, UA Trace (A) Negative   Ketones, UA Negative Negative   RBC, UA Negative Negative   Bilirubin, UA Negative Negative   Urobilinogen, Ur 1.0 0.2 - 1.0 mg/dL   Nitrite, UA Negative Negative  TSH  Result Value Ref Range   TSH 1.210 0.450 - 4.500 uIU/mL  Bayer DCA Hb A1c Waived  Result Value Ref Range   HB A1C (BAYER DCA - WAIVED) 7.3 (H) 4.8 - 5.6 %  Microalbumin, Urine Waived  Result Value Ref Range   Microalb, Ur Waived 80 (H) 0 - 19 mg/L   Creatinine, Urine Waived 200 10 - 300 mg/dL   Microalb/Creat Ratio 30-300 (H) <30 mg/g  Cytology - PAP  Result Value Ref Range   High risk HPV Negative    Adequacy      Satisfactory for evaluation; transformation zone component PRESENT.   Diagnosis      - Negative for Intraepithelial Lesions or Malignancy (NILM)   Diagnosis - Benign reactive/reparative changes    Microorganisms      Fungal organisms present consistent with Candida spp.   Microorganisms Shift in flora suggestive of bacterial vaginosis    Comment Normal Reference Range HPV - Negative       Assessment & Plan:   Problem List Items Addressed This Visit       Endocrine   Type 2 diabetes mellitus with hyperglycemia, without long-term current use of insulin (HCC) - Primary    Not doing well with A1c of 8.6. Not taking her metformin as prescribed. Will add ozempic and recheck as scheduled. Call with any concerns.       Relevant Orders    Bayer DCA Hb A1c Waived   CBC with Differential/Platelet   Comprehensive metabolic panel   Other Visit Diagnoses     Vasovagal syncope       EKG reassuring. Will treat sugars. Call with any concerns. Continue to monitor.   Relevant Orders   EKG 12-Lead        Follow up plan: Return as scheduled.

## 2023-03-02 ENCOUNTER — Encounter: Payer: Self-pay | Admitting: Family Medicine

## 2023-03-28 ENCOUNTER — Ambulatory Visit (INDEPENDENT_AMBULATORY_CARE_PROVIDER_SITE_OTHER): Payer: BC Managed Care – PPO

## 2023-03-28 DIAGNOSIS — Z3042 Encounter for surveillance of injectable contraceptive: Secondary | ICD-10-CM | POA: Diagnosis not present

## 2023-04-05 ENCOUNTER — Ambulatory Visit: Payer: BC Managed Care – PPO | Admitting: Family Medicine

## 2023-05-04 ENCOUNTER — Ambulatory Visit: Payer: BC Managed Care – PPO | Admitting: Family Medicine

## 2023-05-15 ENCOUNTER — Other Ambulatory Visit: Payer: Self-pay | Admitting: Family Medicine

## 2023-05-17 NOTE — Telephone Encounter (Signed)
Requested medication (s) are due for refill today: yes  Requested medication (s) are on the active medication list: yes  Last refill:  10/29/22  Future visit scheduled: no  Notes to clinic:  Unable to refill per protocol, cannot delegate.      Requested Prescriptions  Pending Prescriptions Disp Refills   zolpidem (AMBIEN) 5 MG tablet [Pharmacy Med Name: Zolpidem Tartrate 5 MG Oral Tablet] 30 tablet 0    Sig: TAKE 1 TABLET BY MOUTH AT BEDTIME AS NEEDED FOR SLEEP     Not Delegated - Psychiatry:  Anxiolytics/Hypnotics Failed - 05/15/2023  6:38 PM      Failed - This refill cannot be delegated      Failed - Urine Drug Screen completed in last 360 days      Passed - Valid encounter within last 6 months    Recent Outpatient Visits           3 months ago Type 2 diabetes mellitus with hyperglycemia, without long-term current use of insulin (HCC)   [place] [place], Megan P, DO   6 months ago Routine general medical examination at a health care facility   [facility] [place], [place] P, DO   1 year ago Encounter for Depo-Provera contraception   [place] [facility] [place], [place], DO   1 year ago Routine screening for STI (sexually transmitted infection)   [facility] [place], Megan P, DO   1 year ago Type 2 diabetes mellitus with hyperglycemia, without long-term current use of insulin [facility])   [place] [facility] [place], [place], DO

## 2023-06-13 ENCOUNTER — Ambulatory Visit: Payer: BC Managed Care – PPO

## 2023-06-13 DIAGNOSIS — Z3042 Encounter for surveillance of injectable contraceptive: Secondary | ICD-10-CM

## 2023-06-13 NOTE — Progress Notes (Signed)
Patient presented to clinic for Depo-Provera injection. Last Depo administered on 03/27/2053 . Patient within  window. Administered 150 mg Depo-Provera in the right deltoid per patient request. Patient tolerated well, no complications encountered. Patient reviewed calendar and asked to return to clinic between 08/29/2023 and 09/12/2023 . Patient verbalized understanding. No HCG required at next visit as long as patient returns within specified time frame.

## 2023-06-15 ENCOUNTER — Other Ambulatory Visit: Payer: Self-pay | Admitting: Family Medicine

## 2023-06-15 NOTE — Telephone Encounter (Signed)
Requested medication (s) are due for refill today:   Provider to review  Requested medication (s) are on the active medication list:   Yes  Future visit scheduled:   Yes 08/31/2023   LOV 02/14/2023     Last ordered: 10/29/2022 #30, 5 refills  Non delegated refill    Requested Prescriptions  Pending Prescriptions Disp Refills   zolpidem (AMBIEN) 5 MG tablet [Pharmacy Med Name: Zolpidem Tartrate 5 MG Oral Tablet] 30 tablet 0    Sig: TAKE 1 TABLET BY MOUTH AT BEDTIME AS NEEDED FOR SLEEP     Not Delegated - Psychiatry:  Anxiolytics/Hypnotics Failed - 06/15/2023  2:27 PM      Failed - This refill cannot be delegated      Failed - Urine Drug Screen completed in last 360 days      Passed - Valid encounter within last 6 months    Recent Outpatient Visits           4 months ago Type 2 diabetes mellitus with hyperglycemia, without long-term current use of insulin (HCC)   [place] [facility] [place], Megan P, DO   7 months ago Routine general medical examination at a health care facility   [facility] [place], [place] P, DO   1 year ago Encounter for Depo-Provera contraception   [place] [facility] [place], [place], DO   1 year ago Routine screening for STI (sexually transmitted infection)   [place] [facility] [place], Megan P, DO   1 year ago Type 2 diabetes mellitus with hyperglycemia, without long-term current use of insulin [facility])   [place] [facility] [place], [place], DO

## 2023-06-15 NOTE — Telephone Encounter (Signed)
Over due for follow up

## 2023-06-20 NOTE — Telephone Encounter (Signed)
Called and scheduled appointment on 06/30/2023 @ 2:40 pm, patient stated that she was completely out of her medication.

## 2023-06-27 ENCOUNTER — Encounter: Payer: Self-pay | Admitting: Family Medicine

## 2023-06-30 ENCOUNTER — Other Ambulatory Visit: Payer: Self-pay | Admitting: Family Medicine

## 2023-06-30 ENCOUNTER — Encounter: Payer: Self-pay | Admitting: Family Medicine

## 2023-06-30 ENCOUNTER — Ambulatory Visit: Payer: BC Managed Care – PPO | Admitting: Family Medicine

## 2023-06-30 VITALS — BP 133/86 | HR 106 | Temp 99.2°F | Wt 195.0 lb

## 2023-06-30 DIAGNOSIS — F331 Major depressive disorder, recurrent, moderate: Secondary | ICD-10-CM | POA: Diagnosis not present

## 2023-06-30 DIAGNOSIS — E1165 Type 2 diabetes mellitus with hyperglycemia: Secondary | ICD-10-CM | POA: Diagnosis not present

## 2023-06-30 DIAGNOSIS — J069 Acute upper respiratory infection, unspecified: Secondary | ICD-10-CM | POA: Diagnosis not present

## 2023-06-30 DIAGNOSIS — E785 Hyperlipidemia, unspecified: Secondary | ICD-10-CM | POA: Diagnosis not present

## 2023-06-30 DIAGNOSIS — E782 Mixed hyperlipidemia: Secondary | ICD-10-CM

## 2023-06-30 DIAGNOSIS — F5104 Psychophysiologic insomnia: Secondary | ICD-10-CM

## 2023-06-30 DIAGNOSIS — Z7984 Long term (current) use of oral hypoglycemic drugs: Secondary | ICD-10-CM

## 2023-06-30 LAB — BAYER DCA HB A1C WAIVED: HB A1C (BAYER DCA - WAIVED): 7.7 % — ABNORMAL HIGH (ref 4.8–5.6)

## 2023-06-30 MED ORDER — ZOLPIDEM TARTRATE 5 MG PO TABS: 5.0000 mg | ORAL_TABLET | Freq: Every evening | ORAL | 2 refills | Status: DC | PRN

## 2023-06-30 MED ORDER — SERTRALINE HCL 100 MG PO TABS: 100.0000 mg | ORAL_TABLET | Freq: Every day | ORAL | 1 refills | Status: DC

## 2023-06-30 MED ORDER — EMPAGLIFLOZIN 25 MG PO TABS: 25.0000 mg | ORAL_TABLET | Freq: Every day | ORAL | 3 refills | Status: DC

## 2023-06-30 MED ORDER — METFORMIN HCL ER 500 MG PO TB24: 1000.0000 mg | ORAL_TABLET | Freq: Every day | ORAL | 1 refills | Status: DC

## 2023-06-30 MED ORDER — PREDNISONE 50 MG PO TABS: 50.0000 mg | ORAL_TABLET | Freq: Every day | ORAL | 0 refills | Status: DC

## 2023-06-30 MED ORDER — ARIPIPRAZOLE 5 MG PO TABS
5.0000 mg | ORAL_TABLET | Freq: Every day | ORAL | 3 refills | Status: DC
Start: 2023-06-30 — End: 2023-09-05

## 2023-06-30 MED ORDER — TRIAMCINOLONE ACETONIDE 40 MG/ML IJ SUSP
40.0000 mg | Freq: Once | INTRAMUSCULAR | Status: AC
  Administered 2023-06-30: 40 mg via INTRAMUSCULAR

## 2023-06-30 NOTE — Assessment & Plan Note (Signed)
Not doing well. Will add abilify and continue ambien. Recheck in about a month.

## 2023-06-30 NOTE — Assessment & Plan Note (Signed)
Improved today with A1c of 7.7. Still not taking medicine regularly. Diarrhea on higher doses of metformin. Will cut her metformin to 1000mg  daily and start jardiance and recheck in about a month. Call with any concerns.

## 2023-06-30 NOTE — Telephone Encounter (Signed)
Requested medication (s) are due for refill today:   Provider to review  Requested medication (s) are on the active medication list:   Yes  Future visit scheduled:   Yes 07/28/2023 with Dr. Laural Benes   Last ordered: 10/29/2022 #30, 5 refills  Non delegated refill    Requested Prescriptions  Pending Prescriptions Disp Refills   zolpidem (AMBIEN) 5 MG tablet [Pharmacy Med Name: Zolpidem Tartrate 5 MG Oral Tablet] 30 tablet 0    Sig: TAKE 1 TABLET BY MOUTH AT BEDTIME AS NEEDED FOR SLEEP     Not Delegated - Psychiatry:  Anxiolytics/Hypnotics Failed - 06/30/2023  3:43 PM      Failed - This refill cannot be delegated      Failed - Urine Drug Screen completed in last 360 days      Passed - Valid encounter within last 6 months    Recent Outpatient Visits           Today Type 2 diabetes mellitus with hyperglycemia, without long-term current use of insulin (HCC)   [facility] [place], Megan P, DO   4 months ago Type 2 diabetes mellitus with hyperglycemia, without long-term current use of insulin (HCC)   [place] [facility] [place], Megan P, DO   8 months ago Routine general medical examination at a health care facility   [facility] [place], [place] P, DO   1 year ago Encounter for Depo-Provera contraception   [place] [facility] [place], [place], DO   1 year ago Routine screening for STI (sexually transmitted infection)   [place] [facility] [place], DO       Future Appointments             In 4 weeks Laural Benes, Oralia Rud, DO [place] [facility]

## 2023-06-30 NOTE — Telephone Encounter (Signed)
Seen today. 

## 2023-06-30 NOTE — Assessment & Plan Note (Signed)
Rechecking labs today. Await results. Treat as needed.  °

## 2023-06-30 NOTE — Progress Notes (Signed)
BP 133/86   Pulse (!) 106   Temp 99.2 F (37.3 C) (Oral)   Wt 195 lb (88.5 kg)   SpO2 100%   BMI 31.96 kg/m    Subjective:    Patient ID: Maria Little, female    DOB: October 31, 1975, 47 y.o.   MRN: [id number]  HPI: Maria Little is a 47 y.o. female  Chief Complaint  Patient presents with   Diabetes    Patient declines having a recent Diabetic Eye Exam at today's visit.    Nasal Congestion   Sore Throat    Patient says she became symptomatic since Monday evening. Patient has tried over the counter medication Nyquil. Patient says feels the medication isn't helping.    Headache   Chills   Fever   DIABETES- has been missing her medicine about 1 day a weeks Hypoglycemic episodes:no Polydipsia/polyuria: no Visual disturbance: no Chest pain: no Paresthesias: no Glucose Monitoring: yes  Accucheck frequency: Daily  Fasting glucose: 240 Taking Insulin?: no Blood Pressure Monitoring: not checking Retinal Examination: Not up to Date Foot Exam: Not up to Date Diabetic Education: Completed Pneumovax: Up to Date Influenza: Not up to Date Aspirin: no  DEPRESSION Mood status: stable Satisfied with current treatment?: yes Symptom severity: moderate  Duration of current treatment : chronic Side effects: no Medication compliance: fair compliance Psychotherapy/counseling: yes current Previous psychiatric medications: sertraline Depressed mood: yes Anxious mood: yes Anhedonia: no Significant weight loss or gain: no Insomnia: yes  falling asleep and staying asleep Fatigue: no Feelings of worthlessness or guilt: no Impaired concentration/indecisiveness: no Suicidal ideations: no Hopelessness: no Crying spells: no    02/14/2023    2:34 PM 10/29/2022    2:23 PM 02/26/2022    4:55 PM 12/30/2021    8:37 AM 11/25/2021    8:34 AM  Depression screen PHQ 2/9  Decreased Interest 0 0 0 0 0  Down, Depressed, Hopeless 3 0 0 0 0  PHQ - 2 Score 3 0 0 0 0  Altered sleeping 3 2  1 2 2   Tired, decreased energy  0 0 2 0  Change in appetite 1 0 0 1 0  Feeling bad or failure about yourself   0 0 0 0  Trouble concentrating  0 0 0 0  Moving slowly or fidgety/restless  0 0 0 0  Suicidal thoughts  0 0 0 0  PHQ-9 Score 7 2 1 5 2   Difficult doing work/chores Somewhat difficult  Not difficult at all Not difficult at all    UPPER RESPIRATORY TRACT INFECTION Duration: 4 days Worst symptom: sore throat Fever: yes Cough: no Shortness of breath: no Wheezing: no Chest pain: no Chest tightness: no Chest congestion: no Nasal congestion: yes Runny nose: yes Post nasal drip: yes Sneezing: no Sore throat: yes Swollen glands: no Sinus pressure: no Headache: no Face pain: no Toothache: no Ear pain: no  Ear pressure: no  Eyes red/itching:no Eye drainage/crusting: yes  Vomiting: no Rash: no Fatigue: no Sick contacts: no Strep contacts: no  Context: stable Recurrent sinusitis: no Relief with OTC cold/cough medications: no  Treatments attempted: theraflu   Relevant past medical, surgical, family and social history reviewed and updated as indicated. Interim medical history since our last visit reviewed. Allergies and medications reviewed and updated.  Review of Systems  Constitutional:  Positive for chills, diaphoresis, fatigue and fever. Negative for activity change, appetite change and unexpected weight change.  HENT:  Positive for sore throat. Negative for congestion,  dental problem, drooling, ear discharge, ear pain, facial swelling, hearing loss, mouth sores, nosebleeds, postnasal drip, rhinorrhea, sinus pressure, sinus pain, sneezing, tinnitus, trouble swallowing and voice change.   Eyes: Negative.   Respiratory: Negative.    Cardiovascular: Negative.   Gastrointestinal: Negative.   Musculoskeletal: Negative.   Skin: Negative.   Psychiatric/Behavioral:  Positive for dysphoric mood and sleep disturbance. Negative for agitation, behavioral problems, confusion,  decreased concentration, hallucinations, self-injury and suicidal ideas. The patient is nervous/anxious. The patient is not hyperactive.     Per HPI unless specifically indicated above     Objective:    BP 133/86   Pulse (!) 106   Temp 99.2 F (37.3 C) (Oral)   Wt 195 lb (88.5 kg)   SpO2 100%   BMI 31.96 kg/m   Wt Readings from Last 3 Encounters:  06/30/23 195 lb (88.5 kg)  02/14/23 194 lb 3.2 oz (88.1 kg)  10/29/22 195 lb 9.6 oz (88.7 kg)    Physical Exam Vitals and nursing note reviewed.  Constitutional:      General: She is not in acute distress.    Appearance: Normal appearance. She is not ill-appearing, toxic-appearing or diaphoretic.  HENT:     Head: Normocephalic and atraumatic.     Right Ear: External ear normal.     Left Ear: External ear normal.     Nose: Nose normal.     Mouth/Throat:     Mouth: Mucous membranes are moist.     Pharynx: Oropharynx is clear.  Eyes:     General: No scleral icterus.       Right eye: No discharge.        Left eye: No discharge.     Extraocular Movements: Extraocular movements intact.     Conjunctiva/sclera: Conjunctivae normal.     Pupils: Pupils are equal, round, and reactive to light.  Cardiovascular:     Rate and Rhythm: Normal rate and regular rhythm.     Pulses: Normal pulses.     Heart sounds: Normal heart sounds. No murmur heard.    No friction rub. No gallop.  Pulmonary:     Effort: Pulmonary effort is normal. No respiratory distress.     Breath sounds: Normal breath sounds. No stridor. No wheezing, rhonchi or rales.  Chest:     Chest wall: No tenderness.  Musculoskeletal:        General: Normal range of motion.     Cervical back: Normal range of motion and neck supple.  Skin:    General: Skin is warm and dry.     Capillary Refill: Capillary refill takes less than 2 seconds.     Coloration: Skin is not jaundiced or pale.     Findings: No bruising, erythema, lesion or rash.  Neurological:     General: No focal  deficit present.     Mental Status: She is alert and oriented to person, place, and time. Mental status is at baseline.  Psychiatric:        Mood and Affect: Mood normal.        Behavior: Behavior normal.        Thought Content: Thought content normal.        Judgment: Judgment normal.     Results for orders placed or performed in visit on 02/14/23  Bayer DCA Hb A1c Waived   Collection Time: 02/14/23  2:34 PM  Result Value Ref Range   HB A1C (BAYER DCA - WAIVED) 8.6 (H) 4.8 - 5.6 %  CBC with Differential/Platelet   Collection Time: 02/14/23  2:36 PM  Result Value Ref Range   WBC 8.1 3.4 - 10.8 x10E3/uL   RBC 4.43 3.77 - 5.28 x10E6/uL   Hemoglobin 11.3 11.1 - 15.9 g/dL   Hematocrit 40.3 47.4 - 46.6 %   MCV 82 79 - 97 fL   MCH 25.5 (L) 26.6 - 33.0 pg   MCHC 31.3 (L) 31.5 - 35.7 g/dL   RDW 25.9 56.3 - 87.5 %   Platelets 409 150 - 450 x10E3/uL   Neutrophils 54 Not Estab. %   Lymphs 38 Not Estab. %   Monocytes 7 Not Estab. %   Eos 1 Not Estab. %   Basos 0 Not Estab. %   Neutrophils Absolute 4.4 1.4 - 7.0 x10E3/uL   Lymphocytes Absolute 3.0 0.7 - 3.1 x10E3/uL   Monocytes Absolute 0.5 0.1 - 0.9 x10E3/uL   EOS (ABSOLUTE) 0.1 0.0 - 0.4 x10E3/uL   Basophils Absolute 0.0 0.0 - 0.2 x10E3/uL   Immature Granulocytes 0 Not Estab. %   Immature Grans (Abs) 0.0 0.0 - 0.1 x10E3/uL  Comprehensive metabolic panel   Collection Time: 02/14/23  2:36 PM  Result Value Ref Range   Glucose 226 (H) 70 - 99 mg/dL   BUN 4 (L) 6 - 24 mg/dL   Creatinine, Ser 6.43 0.57 - 1.00 mg/dL   eGFR 329 >51 OA/CZY/6.06   BUN/Creatinine Ratio 5 (L) 9 - 23   Sodium 137 134 - 144 mmol/L   Potassium 3.3 (L) 3.5 - 5.2 mmol/L   Chloride 98 96 - 106 mmol/L   CO2 25 20 - 29 mmol/L   Calcium 8.7 8.7 - 10.2 mg/dL   Total Protein 6.3 6.0 - 8.5 g/dL   Albumin 3.7 (L) 3.9 - 4.9 g/dL   Globulin, Total 2.6 1.5 - 4.5 g/dL   Bilirubin Total 0.3 0.0 - 1.2 mg/dL   Alkaline Phosphatase 122 (H) 44 - 121 IU/L   AST 18 0 - 40  IU/L   ALT 12 0 - 32 IU/L      Assessment & Plan:   Problem List Items Addressed This Visit       Endocrine   Type 2 diabetes mellitus with hyperglycemia, without long-term current use of insulin (HCC) - Primary   Improved today with A1c of 7.7. Still not taking medicine regularly. Diarrhea on higher doses of metformin. Will cut her metformin to 1000mg  daily and start jardiance and recheck in about a month. Call with any concerns.       Relevant Medications   metFORMIN (GLUCOPHAGE-XR) 500 MG 24 hr tablet   empagliflozin (JARDIANCE) 25 MG TABS tablet   Other Relevant Orders   Bayer DCA Hb A1c Waived   CBC with Differential/Platelet   Comprehensive metabolic panel   Ambulatory referral to Ophthalmology     Other   Depression, major, recurrent, moderate (HCC)   Doing OK with depression, but increased anxiety. Continue sertraline and start abilify at nighttime. Call with any concerns.       Relevant Medications   sertraline (ZOLOFT) 100 MG tablet   Hyperlipidemia   Rechecking labs today. Await results. Treat as needed.       Relevant Orders   Lipid Panel w/o Chol/HDL Ratio   CBC with Differential/Platelet   Comprehensive metabolic panel   Psychophysiological insomnia   Not doing well. Will add abilify and continue ambien. Recheck in about a month.       Other Visit Diagnoses  Upper respiratory tract infection, unspecified type       Will treat with prednisone. Call if not getting better or getting worse.   Relevant Medications   triamcinolone acetonide (KENALOG-40) injection 40 mg (Completed)   Other Relevant Orders   Rapid Strep Screen ([facility] ONLY)   Veritor Flu A/B Waived   Novel Coronavirus, NAA ([facility])        Follow up plan: Return in about 4 weeks (around 07/28/2023) for mood.

## 2023-06-30 NOTE — Assessment & Plan Note (Signed)
Doing OK with depression, but increased anxiety. Continue sertraline and start abilify at nighttime. Call with any concerns.

## 2023-07-01 ENCOUNTER — Encounter: Payer: Self-pay | Admitting: Family Medicine

## 2023-07-01 DIAGNOSIS — E1165 Type 2 diabetes mellitus with hyperglycemia: Secondary | ICD-10-CM

## 2023-07-01 LAB — CBC WITH DIFFERENTIAL/PLATELET
Basophils Absolute: 0 10*3/uL (ref 0.0–0.2)
Basos: 0 %
EOS (ABSOLUTE): 0.2 10*3/uL (ref 0.0–0.4)
Eos: 2 %
Hematocrit: 41.1 % (ref 34.0–46.6)
Hemoglobin: 12.8 g/dL (ref 11.1–15.9)
Immature Grans (Abs): 0 10*3/uL (ref 0.0–0.1)
Immature Granulocytes: 0 %
Lymphocytes Absolute: 3.2 10*3/uL — ABNORMAL HIGH (ref 0.7–3.1)
Lymphs: 34 %
MCH: 25.7 pg — ABNORMAL LOW (ref 26.6–33.0)
MCHC: 31.1 g/dL — ABNORMAL LOW (ref 31.5–35.7)
MCV: 83 fL (ref 79–97)
Monocytes Absolute: 0.5 10*3/uL (ref 0.1–0.9)
Monocytes: 6 %
Neutrophils Absolute: 5.5 10*3/uL (ref 1.4–7.0)
Neutrophils: 58 %
Platelets: 346 10*3/uL (ref 150–450)
RBC: 4.98 x10E6/uL (ref 3.77–5.28)
RDW: 15.4 % (ref 11.7–15.4)
WBC: 9.5 10*3/uL (ref 3.4–10.8)

## 2023-07-01 LAB — COMPREHENSIVE METABOLIC PANEL
ALT: 12 [IU]/L (ref 0–32)
AST: 19 [IU]/L (ref 0–40)
Albumin: 3.8 g/dL — ABNORMAL LOW (ref 3.9–4.9)
Alkaline Phosphatase: 110 [IU]/L (ref 44–121)
BUN/Creatinine Ratio: 16 (ref 9–23)
BUN: 12 mg/dL (ref 6–24)
Bilirubin Total: 0.2 mg/dL (ref 0.0–1.2)
CO2: 19 mmol/L — ABNORMAL LOW (ref 20–29)
Calcium: 8.7 mg/dL (ref 8.7–10.2)
Chloride: 102 mmol/L (ref 96–106)
Creatinine, Ser: 0.73 mg/dL (ref 0.57–1.00)
Globulin, Total: 3.2 g/dL (ref 1.5–4.5)
Glucose: 227 mg/dL — ABNORMAL HIGH (ref 70–99)
Potassium: 3.8 mmol/L (ref 3.5–5.2)
Sodium: 139 mmol/L (ref 134–144)
Total Protein: 7 g/dL (ref 6.0–8.5)
eGFR: 103 mL/min/{1.73_m2} (ref >59)

## 2023-07-01 LAB — LIPID PANEL W/O CHOL/HDL RATIO
Cholesterol, Total: 288 mg/dL — ABNORMAL HIGH (ref 100–199)
HDL: 53 mg/dL (ref >39)
LDL Chol Calc (NIH): 159 mg/dL — ABNORMAL HIGH (ref 0–99)
Triglycerides: 400 mg/dL — ABNORMAL HIGH (ref 0–149)
VLDL Cholesterol Cal: 76 mg/dL — ABNORMAL HIGH (ref 5–40)

## 2023-07-02 LAB — NOVEL CORONAVIRUS, NAA: SARS-CoV-2, NAA: NOT DETECTED

## 2023-07-04 ENCOUNTER — Other Ambulatory Visit: Payer: Self-pay | Admitting: Family Medicine

## 2023-07-04 ENCOUNTER — Telehealth: Payer: Self-pay

## 2023-07-04 ENCOUNTER — Encounter: Payer: Self-pay | Admitting: Family Medicine

## 2023-07-04 LAB — VERITOR FLU A/B WAIVED
Influenza A: NEGATIVE
Influenza B: NEGATIVE

## 2023-07-04 LAB — RAPID STREP SCREEN (MED CTR MEBANE ONLY): Strep Gp A Ag, IA W/Reflex: NEGATIVE

## 2023-07-04 LAB — CULTURE, GROUP A STREP: Strep A Culture: NEGATIVE

## 2023-07-04 MED ORDER — ROSUVASTATIN CALCIUM 5 MG PO TABS: 5.0000 mg | ORAL_TABLET | Freq: Every day | ORAL | 0 refills | Status: DC

## 2023-07-04 NOTE — Progress Notes (Signed)
   Care Guide Note  07/04/2023 Name: Maria Little MRN: [id number] DOB: 13-Jan-1976  Referred by: Dorcas Carrow, DO Reason for referral : Care Coordination (Outreach to schedule with Pharm d )   Maria Little is a 47 y.o. year old female who is a primary care patient of Dorcas Carrow, DO. Maria Little was referred to the pharmacist for assistance related to DM.    Successful contact was made with the patient to discuss pharmacy services including being ready for the pharmacist to call at least 5 minutes before the scheduled appointment time, to have medication bottles and any blood sugar or blood pressure readings ready for review. The patient agreed to meet with the pharmacist via with the pharmacist via telephone visit on (date/time).  07/11/2023  Penne Lash , RMA     [place]  [facility], [facility] Guide  Direct Dial: [phone]  Website: [email or web address]

## 2023-07-07 ENCOUNTER — Encounter: Payer: Self-pay | Admitting: Family Medicine

## 2023-07-07 NOTE — Telephone Encounter (Signed)
Called patient left detailed message for patient to call back and schedule appointment

## 2023-07-11 ENCOUNTER — Other Ambulatory Visit: Payer: Self-pay

## 2023-07-11 NOTE — Progress Notes (Unsigned)
07/11/2023 Name: Maria Little MRN: [id number] DOB: Oct 09, 1975  Chief Complaint  Patient presents with   Medication Assistance   Maria Little is a 47 y.o. year old female who presented for a telephone visit.   They were referred to the pharmacist by their PCP for assistance in managing medication access.   Subjective:  Care Team: Primary Care Provider: Dorcas Carrow, DO ; Next Scheduled Visit: 07/28/23  Medication Access/Adherence  Current Pharmacy:  [facility] [address]), [place] - [address] [place] ([place]) [place] [address] Phone: [phone] Fax: [phone]  -Patient reports affordability concerns with their medications: Yes  -Patient reports access/transportation concerns to their pharmacy: No  -Patient reports adherence concerns with their medications:  Yes    Diabetes: Current medications: metformin XR 1000mg  daily, Jardiance 25mg  daily -Patient was recently started on Jardiance 25mg  daily and metformin was decreased from 2000mg  TDD to 1000mg ; however, patient has not been able to pick up or start Jariance due to affordability -Patient states copay for Jardiance for 1 month was going to be $150, which she cannot afford -Patient is checking home BG on a regular basis and states this has been >200 at times since decreasing metformin-did not specify if this was a fasting reading or not when asked -A1c 7.7% 12/12 -Patient states she is buying test strips and lancets OTC, because insurance does not cover these  Objective: Lab Results  Component Value Date   HGBA1C 7.7 (H) 06/30/2023   Lab Results  Component Value Date   CREATININE 0.73 06/30/2023   BUN 12 06/30/2023   NA 139 06/30/2023   K 3.8 06/30/2023   CL 102 06/30/2023   CO2 19 (L) 06/30/2023   Medications Reviewed Today     Reviewed by Lenna Gilford, RPH (Pharmacist) on 07/11/23 at 1501  Med List Status: <None>   Medication Order Taking?  Sig Documenting Provider Last Dose Status Informant  ARIPiprazole (ABILIFY) 5 MG tablet [id number] Yes Take 1 tablet (5 mg total) by mouth at bedtime. Laural Benes, Megan P, DO Taking Active   empagliflozin (JARDIANCE) 25 MG TABS tablet [id number] No Take 1 tablet (25 mg total) by mouth daily before breakfast.  Patient not taking: Reported on 07/11/2023   Dorcas Carrow, DO Not Taking Active   medroxyPROGESTERone (DEPO-PROVERA) 150 MG/ML injection [id number]  Inject 150 mg into the muscle every 3 (three) months. [provider]  Active   metFORMIN (GLUCOPHAGE-XR) 500 MG 24 hr tablet [id number] Yes Take 2 tablets (1,000 mg total) by mouth daily with breakfast. Olevia Perches P, DO Taking Active   rosuvastatin (CRESTOR) 5 MG tablet [id number] Yes Take 1 tablet (5 mg total) by mouth daily. Johnson, Megan P, DO Taking Active   sertraline (ZOLOFT) 100 MG tablet [id number] Yes Take 1 tablet (100 mg total) by mouth daily. Johnson, Megan P, DO Taking Active   zolpidem (AMBIEN) 5 MG tablet [id number] Yes Take 1 tablet (5 mg total) by mouth at bedtime as needed. for sleep Olevia Perches P, DO Taking Active            Assessment/Plan:   Diabetes: - Currently uncontrolled - Obtained Jardiance copay assistance card for patient and provided processing information to [facility]; 1 month supply is now going through for $10 - Inquired about plan coverage of testing supplies, and pharmacy states [employer] is preferred on the [employer] prescription plan.  Pending prescriptions for Dr. Laural Benes to sign if in agreement  and will call pharmacy to check copay once signed.  Plan does have a high deductible, so it may still be more expensive than what patient is currently paying for generic supplies OTC.  Follow-up:  Will contact pharmacy to check copay for testing supplies and notify patient of all above information  Lenna Gilford, PharmD, DPLA

## 2023-07-14 ENCOUNTER — Telehealth: Payer: Self-pay | Admitting: Family Medicine

## 2023-07-14 MED ORDER — ACCU-CHEK GUIDE TEST VI STRP: ORAL_STRIP | 12 refills | Status: DC

## 2023-07-14 MED ORDER — ACCU-CHEK SOFTCLIX LANCETS MISC: 12 refills | Status: DC

## 2023-07-14 MED ORDER — ACCU-CHEK GUIDE W/DEVICE KIT: PACK | 0 refills | Status: DC

## 2023-07-14 NOTE — Telephone Encounter (Signed)
Copied from CRM [id number]. Topic: General - Other >> Jul 14, 2023  2:03 PM Turkey B wrote: Reason for CRM: pt called in to speak with Sabino Niemann about her meds. Please cb

## 2023-07-14 NOTE — Telephone Encounter (Signed)
Routing to [employer]....Marland KitchenMarland Kitchen

## 2023-07-15 ENCOUNTER — Telehealth: Payer: Self-pay

## 2023-07-15 NOTE — Progress Notes (Signed)
   07/15/2023  Patient ID: Maria Little, female   DOB: 1975/08/27, 47 y.o.   MRN: [id number]  Message received yesterday that patient had called [facility] to speak with me.  Prior to returning call, I contacted [facility] and verified patient was able to pick up Jardiance.  I also inquired about copays for the testing supplies sent over yesterday.  Glucomter is $10, #100 lancets $12.24, #100 test strips $24.99.  Attempted to return patient call and to discuss testing supply costs; but I was not able to reach the patient.  I did leave a HIPAA compliant voicemail with my direct phone number and will try to reach the patient again next week if I do not hear back.  Lenna Gilford, PharmD, DPLA

## 2023-07-19 ENCOUNTER — Encounter: Payer: Self-pay | Admitting: Family Medicine

## 2023-07-22 NOTE — Progress Notes (Signed)
   07/22/2023  Patient ID: Maria Little, female   DOB: 09/17/1975, 49 y.o.   MRN: [id number]  Patient sent message via MyChart to inform me that she will continue to purchase testing supplies OTC based on affordability.  She sees Dr. Vicci again 1/9, and I will schedule f/u with her according to notes/lab results from this visit.  Maria Little, PharmD, DPLA

## 2023-07-28 ENCOUNTER — Ambulatory Visit: Payer: BC Managed Care – PPO | Admitting: Family Medicine

## 2023-08-11 ENCOUNTER — Telehealth: Payer: Self-pay

## 2023-08-12 MED ORDER — STEGLATRO 15 MG PO TABS: 15.0000 mg | ORAL_TABLET | Freq: Every day | ORAL | 3 refills | Status: DC

## 2023-08-12 NOTE — Telephone Encounter (Signed)
Refill request was received from [facility] requesting a refill on prescription STEGLATRO (tablet) for the patient. Fax states they are discontinuing Jardiance 25 MG tablet. Please advise?

## 2023-08-17 ENCOUNTER — Encounter: Payer: Self-pay | Admitting: Family Medicine

## 2023-08-19 ENCOUNTER — Ambulatory Visit: Payer: BC Managed Care – PPO | Admitting: Family Medicine

## 2023-08-19 ENCOUNTER — Encounter: Payer: Self-pay | Admitting: Family Medicine

## 2023-08-19 NOTE — Telephone Encounter (Signed)
This is likely because of her deductible at the beginning of the year. I'm not sure if there's a coupon card for steglatro- but she can look on [email or web address], but I know her insurance said this was going to be cheaper for her than jardiance.

## 2023-08-31 ENCOUNTER — Ambulatory Visit: Payer: BC Managed Care – PPO

## 2023-08-31 DIAGNOSIS — Z3042 Encounter for surveillance of injectable contraceptive: Secondary | ICD-10-CM

## 2023-08-31 MED ORDER — MEDROXYPROGESTERONE ACETATE 150 MG/ML IM SUSP
150.0000 mg | INTRAMUSCULAR | Status: AC
  Administered 2023-08-31 – 2024-02-08 (×3): 150 mg via INTRAMUSCULAR

## 2023-09-02 ENCOUNTER — Ambulatory Visit: Payer: Self-pay

## 2023-09-02 NOTE — Telephone Encounter (Signed)
  Chief Complaint:  urinary urgency. Vaginal itching, pain, cuts on labia form shaving Symptoms: above Frequency: 2 weeks Pertinent Negatives: Patient denies discharge Disposition: [] ED /[] Urgent Care (no appt availability in office) / [] Appointment(In office/virtual)/ []  [facility]/ [] Home Care/ [] Refused Recommended Disposition /[]  Mobile Bus/ [x]  Follow-up with PCP Additional Notes: Pt is requesting an appt. For today. Please call pt back.     Summary: vaginal itching   Calling states she's experiencing vaginal itching, burning for awhile. Caller states she has cuts on her vagina due to shaving. Seeking medication attention. Practice has no appointments.     Reason for Disposition  MODERATE-SEVERE itching (i.e., interferes with school, work, or sleep)  Answer Assessment - Initial Assessment Questions 1. SYMPTOM: "What's the main symptom you're concerned about?" (e.g., pain, itching, dryness)     Pain itching 2. LOCATION: "Where is the  s/s located?" (e.g., inside/outside, left/right)     Vaginal area 3. ONSET: "When did the  s/s  start?"     2 weeks 4. PAIN: "Is there any pain?" If Yes, ask: "How bad is it?" (Scale: 1-10; mild, moderate, severe)   -  MILD (1-3): Doesn't interfere with normal activities.    -  MODERATE (4-7): Interferes with normal activities (e.g., work or school) or awakens from sleep.     -  SEVERE (8-10): Excruciating pain, unable to do any normal activities.     mild 5. ITCHING: "Is there any itching?" If Yes, ask: "How bad is it?" (Scale: 1-10; mild, moderate, severe)     *No Answer* 6. CAUSE: "What do you think is causing the discharge?" "Have you had the same problem before? What happened then?"     no 7. OTHER SYMPTOMS: "Do you have any other symptoms?" (e.g., fever, itching, vaginal bleeding, pain with urination, injury to genital area, vaginal foreign body)     Urinary urgency 8. PREGNANCY: "Is there any chance you are  pregnant?" "When was your last menstrual period?"     no  Protocols used: Vaginal Symptoms-A-AH

## 2023-09-02 NOTE — Telephone Encounter (Signed)
Patient has appt 09/07/2023 at 9:00 AM

## 2023-09-05 ENCOUNTER — Encounter: Payer: Self-pay | Admitting: Family Medicine

## 2023-09-05 ENCOUNTER — Ambulatory Visit: Payer: BC Managed Care – PPO | Admitting: Family Medicine

## 2023-09-05 VITALS — BP 107/76 | HR 120 | Resp 16 | Ht 65.51 in | Wt 196.8 lb

## 2023-09-05 DIAGNOSIS — R300 Dysuria: Secondary | ICD-10-CM

## 2023-09-05 DIAGNOSIS — F331 Major depressive disorder, recurrent, moderate: Secondary | ICD-10-CM | POA: Diagnosis not present

## 2023-09-05 DIAGNOSIS — Z7984 Long term (current) use of oral hypoglycemic drugs: Secondary | ICD-10-CM

## 2023-09-05 DIAGNOSIS — E1165 Type 2 diabetes mellitus with hyperglycemia: Secondary | ICD-10-CM | POA: Diagnosis not present

## 2023-09-05 DIAGNOSIS — B9689 Other specified bacterial agents as the cause of diseases classified elsewhere: Secondary | ICD-10-CM

## 2023-09-05 DIAGNOSIS — N760 Acute vaginitis: Secondary | ICD-10-CM | POA: Diagnosis not present

## 2023-09-05 LAB — URINALYSIS, ROUTINE W REFLEX MICROSCOPIC
Bilirubin, UA: NEGATIVE
Ketones, UA: NEGATIVE
Leukocytes,UA: NEGATIVE
Nitrite, UA: NEGATIVE
Protein,UA: NEGATIVE
Specific Gravity, UA: 1.015 (ref 1.005–1.030)
Urobilinogen, Ur: 0.2 mg/dL (ref 0.2–1.0)
pH, UA: 5.5 (ref 5.0–7.5)

## 2023-09-05 LAB — MICROSCOPIC EXAMINATION

## 2023-09-05 LAB — WET PREP FOR TRICH, YEAST, CLUE
Clue Cell Exam: POSITIVE — AB
Trichomonas Exam: NEGATIVE
Yeast Exam: NEGATIVE

## 2023-09-05 MED ORDER — ARIPIPRAZOLE 5 MG PO TABS: 5.0000 mg | ORAL_TABLET | Freq: Every day | ORAL | 0 refills | Status: DC

## 2023-09-05 MED ORDER — METRONIDAZOLE 500 MG PO TABS
500.0000 mg | ORAL_TABLET | Freq: Three times a day (TID) | ORAL | 0 refills | Status: AC
Start: 2023-09-05 — End: 2023-09-12

## 2023-09-05 NOTE — Progress Notes (Signed)
error 

## 2023-09-05 NOTE — Progress Notes (Signed)
 BP 107/76 (BP Location: Right Arm, Patient Position: Sitting, Cuff Size: Normal)   Pulse (!) 120   Resp 16   Ht 5' 5.51" (1.664 m)   Wt 196 lb 12.8 oz (89.3 kg)   LMP 08/29/2023 (Exact Date)   SpO2 99%   BMI 32.24 kg/m    Subjective:    Patient ID: Maria Little, female    DOB: 1975/08/25, 48 y.o.   MRN: [id number]  HPI: Maria Little is a 48 y.o. female  Chief Complaint  Patient presents with   Urinary Tract Infection   Vaginal Itching    Burning, pain, no discharge, itching, no odor. Ongoing for around a month. Using monistat care for itching. Lotion for cuts.    VAGINAL ITCHING- was having issues, but currently has no symptoms  Duration: 3-4 weeks Pruritus: yes Dysuria: yes Malodorous: no Urinary frequency: yes Fevers: no Abdominal pain: no  Sexual activity: practicing safe sex History of sexually transmitted diseases: no Recent antibiotic use: no Context: worse  Treatments attempted: antifungal and vagisil  URINARY SYMPTOMS Duration: about 3-4 weeks off and on Dysuria: yes Urinary frequency: yes Urgency: yes Small volume voids: yes Symptom severity: no Urinary incontinence: no Foul odor: no Hematuria: no Abdominal pain: no Back pain: no Suprapubic pain/pressure: yes Flank pain: no Fever:  no Vomiting: no Relief with cranberry juice: no Relief with pyridium: no Status: better Previous urinary tract infection: yes Recurrent urinary tract infection: no Sexual activity: practicing safe sex History of sexually transmitted disease: no Vaginal discharge: yes Treatments attempted: increasing fluids   ANXIETY/DEPRESSION Duration: chronic Status:better Anxious mood: no  Excessive worrying: no Irritability: no  Sweating: no Nausea: no Palpitations:no Hyperventilation: no Panic attacks: no Agoraphobia: no  Obscessions/compulsions: no Depressed mood: yes    09/05/2023   10:39 AM 02/14/2023    2:34 PM 10/29/2022    2:23 PM 02/26/2022     4:55 PM 12/30/2021    8:37 AM  Depression screen PHQ 2/9  Decreased Interest 0 0 0 0 0  Down, Depressed, Hopeless 1 3 0 0 0  PHQ - 2 Score 1 3 0 0 0  Altered sleeping 3 3 2 1 2   Tired, decreased energy 1  0 0 2  Change in appetite 1 1 0 0 1  Feeling bad or failure about yourself  0  0 0 0  Trouble concentrating 0  0 0 0  Moving slowly or fidgety/restless 0  0 0 0  Suicidal thoughts 0  0 0 0  PHQ-9 Score 6 7 2 1 5   Difficult doing work/chores Not difficult at all Somewhat difficult  Not difficult at all Not difficult at all      09/05/2023   10:40 AM 02/14/2023    2:34 PM 10/29/2022    2:24 PM 02/26/2022    4:56 PM  GAD 7 : Generalized Anxiety Score  Nervous, Anxious, on Edge 0  0 0  Control/stop worrying 1 3 0 0  Worry too much - different things 1 3 0 0  Trouble relaxing 0 3 0 1  Restless 0  0 0  Easily annoyed or irritable 0 3 0 0  Afraid - awful might happen 0  0 1  Total GAD 7 Score 2  0 2  Anxiety Difficulty Not difficult at all Not difficult at all  Not difficult at all   Anhedonia: no Weight changes: no Insomnia: yes hard to fall asleep  Hypersomnia: no Fatigue/loss of energy: yes Feelings  of worthlessness: no Feelings of guilt: no Impaired concentration/indecisiveness: no Suicidal ideations: no  Crying spells: no Recent Stressors/Life Changes: no   Relationship problems: no   Family stress: no     Financial stress: no    Job stress: no    Recent death/loss: no  Relevant past medical, surgical, family and social history reviewed and updated as indicated. Interim medical history since our last visit reviewed. Allergies and medications reviewed and updated.  Review of Systems  Constitutional: Negative.   Respiratory: Negative.    Cardiovascular: Negative.   Gastrointestinal: Negative.   Genitourinary:  Positive for dysuria, frequency, urgency, vaginal discharge and vaginal pain. Negative for decreased urine volume, difficulty urinating, dyspareunia, enuresis,  flank pain, genital sores, hematuria, menstrual problem, pelvic pain and vaginal bleeding.  Musculoskeletal: Negative.   Skin: Negative.   Neurological: Negative.   Psychiatric/Behavioral:  Positive for sleep disturbance. Negative for agitation, behavioral problems, confusion, decreased concentration, dysphoric mood, hallucinations, self-injury and suicidal ideas. The patient is nervous/anxious. The patient is not hyperactive.     Per HPI unless specifically indicated above     Objective:    BP 107/76 (BP Location: Right Arm, Patient Position: Sitting, Cuff Size: Normal)   Pulse (!) 120   Resp 16   Ht 5' 5.51" (1.664 m)   Wt 196 lb 12.8 oz (89.3 kg)   LMP 08/29/2023 (Exact Date)   SpO2 99%   BMI 32.24 kg/m   Wt Readings from Last 3 Encounters:  09/05/23 196 lb 12.8 oz (89.3 kg)  06/30/23 195 lb (88.5 kg)  02/14/23 194 lb 3.2 oz (88.1 kg)    Physical Exam Vitals and nursing note reviewed.  Constitutional:      General: She is not in acute distress.    Appearance: Normal appearance. She is not ill-appearing, toxic-appearing or diaphoretic.  HENT:     Head: Normocephalic and atraumatic.     Right Ear: External ear normal.     Left Ear: External ear normal.     Nose: Nose normal.     Mouth/Throat:     Mouth: Mucous membranes are moist.     Pharynx: Oropharynx is clear.  Eyes:     General: No scleral icterus.       Right eye: No discharge.        Left eye: No discharge.     Extraocular Movements: Extraocular movements intact.     Conjunctiva/sclera: Conjunctivae normal.     Pupils: Pupils are equal, round, and reactive to light.  Cardiovascular:     Rate and Rhythm: Normal rate and regular rhythm.     Pulses: Normal pulses.     Heart sounds: Normal heart sounds. No murmur heard.    No friction rub. No gallop.  Pulmonary:     Effort: Pulmonary effort is normal. No respiratory distress.     Breath sounds: Normal breath sounds. No stridor. No wheezing, rhonchi or rales.   Chest:     Chest wall: No tenderness.  Musculoskeletal:        General: Normal range of motion.     Cervical back: Normal range of motion and neck supple.  Skin:    General: Skin is warm and dry.     Capillary Refill: Capillary refill takes less than 2 seconds.     Coloration: Skin is not jaundiced or pale.     Findings: No bruising, erythema, lesion or rash.  Neurological:     General: No focal deficit present.  Mental Status: She is alert and oriented to person, place, and time. Mental status is at baseline.  Psychiatric:        Mood and Affect: Mood normal.        Behavior: Behavior normal.        Thought Content: Thought content normal.        Judgment: Judgment normal.     Results for orders placed or performed in visit on 09/05/23  WET PREP FOR TRICH, YEAST, CLUE   Collection Time: 09/05/23 10:38 AM   Specimen: Urine   Urine  Result Value Ref Range   Trichomonas Exam Negative Negative   Yeast Exam Negative Negative   Clue Cell Exam Positive (A) Negative  Microscopic Examination   Collection Time: 09/05/23 10:38 AM   Urine  Result Value Ref Range   WBC, UA 6-10 (A) 0 - 5 /hpf   RBC, Urine 0-2 0 - 2 /hpf   Epithelial Cells (non renal) 0-10 0 - 10 /hpf   Bacteria, UA Moderate (A) None seen/Few   Yeast, UA Present (A) None seen  Urinalysis, Routine w reflex microscopic   Collection Time: 09/05/23 10:38 AM  Result Value Ref Range   Specific Gravity, UA 1.015 1.005 - 1.030   pH, UA 5.5 5.0 - 7.5   Color, UA Yellow Yellow   Appearance Ur Hazy (A) Clear   Leukocytes,UA Negative Negative   Protein,UA Negative Negative/Trace   Glucose, UA 3+ (A) Negative   Ketones, UA Negative Negative   RBC, UA Trace (A) Negative   Bilirubin, UA Negative Negative   Urobilinogen, Ur 0.2 0.2 - 1.0 mg/dL   Nitrite, UA Negative Negative   Microscopic Examination See below:       Assessment & Plan:   Problem List Items Addressed This Visit       Endocrine   Type 2 diabetes  mellitus with hyperglycemia, without long-term current use of insulin (HCC)   Relevant Orders   AMB Referral [facility] Care Management     Other   Depression, major, recurrent, moderate (HCC)   Doing better on her abilify. Doesn't want to change medicine at this time. Will continue current regimen. Call with any concerns. Continue to monitor.       Other Visit Diagnoses       BV (bacterial vaginosis)    -  Primary   Will treat with flagyl. Call if not getting better or getting worse. Continue to monitor.   Relevant Medications   metroNIDAZOLE (FLAGYL) 500 MG tablet     Dysuria       + clue cells, otherwise normal.   Relevant Orders   Urinalysis, Routine w reflex microscopic (Completed)   WET PREP FOR TRICH, YEAST, CLUE (Completed)        Follow up plan: Return for As scheduled.

## 2023-09-05 NOTE — Assessment & Plan Note (Signed)
 Doing better on her abilify. Doesn't want to change medicine at this time. Will continue current regimen. Call with any concerns. Continue to monitor.

## 2023-09-06 ENCOUNTER — Telehealth: Payer: Self-pay

## 2023-09-06 NOTE — Progress Notes (Signed)
 Care Guide Pharmacy Note  09/06/2023 Name: Maria Little MRN: [id number] DOB: 04-15-1976  Referred By: Dorcas Carrow, DO Reason for referral: Care Coordination (Outreach to schedule with Pharm d )   Maria Little is a 48 y.o. year old female who is a primary care patient of Dorcas Carrow, DO.  Maria Little was referred to the pharmacist for assistance related to: DMII  Successful contact was made with the patient to discuss pharmacy services.  Patient declines engagement at this time. Contact information was provided to the patient should they wish to reach out for assistance at a later time.  Penne Lash , RMA     [facility]  [facility], [facility] Guide  Direct Dial: [phone]  Website: [email or web address]

## 2023-09-07 ENCOUNTER — Ambulatory Visit: Payer: BC Managed Care – PPO | Admitting: Nurse Practitioner

## 2023-09-30 ENCOUNTER — Ambulatory Visit: Payer: BC Managed Care – PPO | Admitting: Family Medicine

## 2023-10-03 ENCOUNTER — Encounter: Payer: Self-pay | Admitting: Family Medicine

## 2023-10-03 NOTE — Telephone Encounter (Signed)
 appt

## 2023-10-04 ENCOUNTER — Encounter: Payer: Self-pay | Admitting: Family Medicine

## 2023-10-06 ENCOUNTER — Encounter: Payer: Self-pay | Admitting: Family Medicine

## 2023-10-06 ENCOUNTER — Ambulatory Visit: Admitting: Family Medicine

## 2023-10-06 VITALS — BP 122/82 | HR 116 | Temp 98.6°F | Resp 17 | Ht 65.51 in | Wt 198.0 lb

## 2023-10-06 DIAGNOSIS — M25562 Pain in left knee: Secondary | ICD-10-CM

## 2023-10-06 DIAGNOSIS — Z7984 Long term (current) use of oral hypoglycemic drugs: Secondary | ICD-10-CM | POA: Diagnosis not present

## 2023-10-06 DIAGNOSIS — E1165 Type 2 diabetes mellitus with hyperglycemia: Secondary | ICD-10-CM

## 2023-10-06 DIAGNOSIS — F5104 Psychophysiologic insomnia: Secondary | ICD-10-CM | POA: Diagnosis not present

## 2023-10-06 LAB — BAYER DCA HB A1C WAIVED: HB A1C (BAYER DCA - WAIVED): 9.5 % — ABNORMAL HIGH (ref 4.8–5.6)

## 2023-10-06 MED ORDER — ROSUVASTATIN CALCIUM 5 MG PO TABS: 5.0000 mg | ORAL_TABLET | Freq: Every day | ORAL | 0 refills | Status: DC

## 2023-10-06 MED ORDER — METFORMIN HCL ER 500 MG PO TB24: 1000.0000 mg | ORAL_TABLET | Freq: Two times a day (BID) | ORAL | 1 refills | Status: DC

## 2023-10-06 MED ORDER — ARIPIPRAZOLE 5 MG PO TABS: 5.0000 mg | ORAL_TABLET | Freq: Every day | ORAL | 0 refills | Status: DC

## 2023-10-06 NOTE — Assessment & Plan Note (Signed)
 Under good control on current regimen. Continue current regimen. Continue to monitor. Call with any concerns. Refills given.

## 2023-10-06 NOTE — Assessment & Plan Note (Signed)
 Not doing well. A1c 9.5. Will increase her metformin to 1000mg  BID. Discussed the importance of really watching her diet. Call with any concerns. Recheck in 3 months.

## 2023-10-06 NOTE — Progress Notes (Signed)
 BP 122/82 (BP Location: Right Arm, Patient Position: Sitting, Cuff Size: Large)   Pulse (!) 116   Temp 98.6 F (37 C) (Oral)   Resp 17   Ht 5' 5.51" (1.664 m)   Wt 198 lb (89.8 kg)   LMP  (LMP Unknown)   SpO2 98%   BMI 32.44 kg/m    Subjective:    Patient ID: Maria Little, female    DOB: [birth year], 48 y.o.   MRN: [id number]  HPI: Maria Little is a 48 y.o. female  Chief Complaint  Patient presents with   Diabetes    Knows she is eating all the wrong things.    ER FOLLOW UP Time since discharge: 1 week Hospital/facility: [facility] Diagnosis: L knee pain Procedures/tests:  No acute osseous abnormality. Medial femorotibial predominant osteoarthrosis. Narrative  EXAM: XR KNEE 4 OR MORE VIEWS LEFT DATE: 09/29/2023 12:48 PM ACCESSION: [id number] UN DICTATED: 09/29/2023 12:48 PM INTERPRETATION LOCATION: Main Campus  CLINICAL INDICATION: 48 years old Female with knee pain      COMPARISON: None.  TECHNIQUE: AP, lateral, and bilateral oblique views of the left knee.     FINDINGS: No acute fracture. Joint alignment is normal. Medial femorotibial predominant narrowing with small osteophytes. No knee effusion or focal soft tissue swelling. Consultants: None New medications: None Discharge instructions: Follow up here  Status: stable  DIABETES Hypoglycemic episodes:no Polydipsia/polyuria: no Visual disturbance: no Chest pain: no Paresthesias: no Glucose Monitoring: no Taking Insulin?: no Blood Pressure Monitoring: not checking Retinal Examination: Not up to Date Foot Exam: Up to Date Diabetic Education: Completed Pneumovax: Up to Date Influenza: Not up to Date Aspirin: no  INSOMNIA Duration: chronic Satisfied with sleep quality: yes Difficulty falling asleep: no Difficulty staying asleep: no Waking a few hours after sleep onset: no Early morning awakenings: no Daytime hypersomnolence: no Wakes feeling refreshed: no Good sleep hygiene:  no Apnea: no Snoring: no Depressed/anxious mood: yes Recent stress: yes Restless legs/nocturnal leg cramps: no Chronic pain/arthritis: no History of sleep study: yes Treatments attempted: melatonin, uinsom, benadryl, and ambien   Relevant past medical, surgical, family and social history reviewed and updated as indicated. Interim medical history since our last visit reviewed. Allergies and medications reviewed and updated.  Review of Systems  Constitutional: Negative.   Respiratory: Negative.    Cardiovascular: Negative.   Gastrointestinal: Negative.   Musculoskeletal:  Positive for arthralgias and joint swelling. Negative for back pain, gait problem, myalgias, neck pain and neck stiffness.  Skin: Negative.   Neurological: Negative.   Psychiatric/Behavioral: Negative.      Per HPI unless specifically indicated above     Objective:    BP 122/82 (BP Location: Right Arm, Patient Position: Sitting, Cuff Size: Large)   Pulse (!) 116   Temp 98.6 F (37 C) (Oral)   Resp 17   Ht 5' 5.51" (1.664 m)   Wt 198 lb (89.8 kg)   LMP  (LMP Unknown)   SpO2 98%   BMI 32.44 kg/m   Wt Readings from Last 3 Encounters:  10/06/23 198 lb (89.8 kg)  09/05/23 196 lb 12.8 oz (89.3 kg)  06/30/23 195 lb (88.5 kg)    Physical Exam Vitals and nursing note reviewed.  Constitutional:      General: She is not in acute distress.    Appearance: Normal appearance. She is obese. She is not ill-appearing, toxic-appearing or diaphoretic.  HENT:     Head: Normocephalic and atraumatic.     Right Ear: External  ear normal.     Left Ear: External ear normal.     Nose: Nose normal.     Mouth/Throat:     Mouth: Mucous membranes are moist.     Pharynx: Oropharynx is clear.  Eyes:     General: No scleral icterus.       Right eye: No discharge.        Left eye: No discharge.     Extraocular Movements: Extraocular movements intact.     Conjunctiva/sclera: Conjunctivae normal.     Pupils: Pupils are  equal, round, and reactive to light.  Cardiovascular:     Rate and Rhythm: Normal rate and regular rhythm.     Pulses: Normal pulses.     Heart sounds: Normal heart sounds. No murmur heard.    No friction rub. No gallop.  Pulmonary:     Effort: Pulmonary effort is normal. No respiratory distress.     Breath sounds: Normal breath sounds. No stridor. No wheezing, rhonchi or rales.  Chest:     Chest wall: No tenderness.  Musculoskeletal:        General: Swelling (L knee) and tenderness (L knee) present.     Cervical back: Normal range of motion and neck supple.  Skin:    General: Skin is warm and dry.     Capillary Refill: Capillary refill takes less than 2 seconds.     Coloration: Skin is not jaundiced or pale.     Findings: No bruising, erythema, lesion or rash.  Neurological:     General: No focal deficit present.     Mental Status: She is alert and oriented to person, place, and time. Mental status is at baseline.  Psychiatric:        Mood and Affect: Mood normal.        Behavior: Behavior normal.        Thought Content: Thought content normal.        Judgment: Judgment normal.     Results for orders placed or performed in visit on 09/05/23  WET PREP FOR TRICH, YEAST, CLUE   Collection Time: 09/05/23 10:38 AM   Specimen: Urine   Urine  Result Value Ref Range   Trichomonas Exam Negative Negative   Yeast Exam Negative Negative   Clue Cell Exam Positive (A) Negative  Microscopic Examination   Collection Time: 09/05/23 10:38 AM   Urine  Result Value Ref Range   WBC, UA 6-10 (A) 0 - 5 /hpf   RBC, Urine 0-2 0 - 2 /hpf   Epithelial Cells (non renal) 0-10 0 - 10 /hpf   Bacteria, UA Moderate (A) None seen/Few   Yeast, UA Present (A) None seen  Urinalysis, Routine w reflex microscopic   Collection Time: 09/05/23 10:38 AM  Result Value Ref Range   Specific Gravity, UA 1.015 1.005 - 1.030   pH, UA 5.5 5.0 - 7.5   Color, UA Yellow Yellow   Appearance Ur Hazy (A) Clear    Leukocytes,UA Negative Negative   Protein,UA Negative Negative/Trace   Glucose, UA 3+ (A) Negative   Ketones, UA Negative Negative   RBC, UA Trace (A) Negative   Bilirubin, UA Negative Negative   Urobilinogen, Ur 0.2 0.2 - 1.0 mg/dL   Nitrite, UA Negative Negative   Microscopic Examination See below:       Assessment & Plan:   Problem List Items Addressed This Visit       Endocrine   Type 2 diabetes mellitus with hyperglycemia, without  long-term current use of insulin (HCC) - Primary   Not doing well. A1c 9.5. Will increase her metformin to 1000mg  BID. Discussed the importance of really watching her diet. Call with any concerns. Recheck in 3 months.       Relevant Medications   JARDIANCE 25 MG TABS tablet   metFORMIN (GLUCOPHAGE-XR) 500 MG 24 hr tablet   rosuvastatin (CRESTOR) 5 MG tablet   Other Relevant Orders   Bayer DCA Hb A1c Waived (STAT)     Other   Psychophysiological insomnia   Under good control on current regimen. Continue current regimen. Continue to monitor. Call with any concerns. Refills given.        Other Visit Diagnoses       Acute pain of left knee       Improving, but still barely able to bear weight. Out of work this week. Will get her into ortho. Await their input.   Relevant Orders   Ambulatory referral to Orthopedic Surgery        Follow up plan: Return in about 3 months (around 01/06/2024) for physical.

## 2023-10-11 ENCOUNTER — Telehealth: Payer: Self-pay

## 2023-10-11 NOTE — Telephone Encounter (Signed)
 Incoming fax received for patients knee injury from [facility]. Provider had already approved completing paperwork for the remainder of the week at which time any additional time off needed to be approved and completed by [facility].

## 2023-10-30 ENCOUNTER — Other Ambulatory Visit: Payer: Self-pay | Admitting: Family Medicine

## 2023-11-01 NOTE — Telephone Encounter (Signed)
 Requested medication (s) are due for refill today: Yes  Requested medication (s) are on the active medication list: Yes  Last refill:    Future visit scheduled: Yes  Notes to clinic:  Valtrex - historical provider   Ambien - not delegated    Requested Prescriptions  Pending Prescriptions Disp Refills   valACYclovir (VALTREX) 1000 MG tablet [Pharmacy Med Name: valACYclovir HCl 1 GM Oral Tablet] 20 tablet 0    Sig: TAKE 1 TABLET BY MOUTH TWICE DAILY FOR 10 DAYS AT  ONSET  OF  LESIONS     Antimicrobials:  Antiviral Agents - Anti-Herpetic Passed - 11/01/2023  9:09 AM      Passed - Valid encounter within last 12 months    Recent Outpatient Visits           3 weeks ago Type 2 diabetes mellitus with hyperglycemia, without long-term current use of insulin (HCC)   [place] [facility] [place], Megan P, DO   1 month ago BV (bacterial vaginosis)   [place] [facility], Megan P, DO               zolpidem (AMBIEN) 5 MG tablet [Pharmacy Med Name: Zolpidem Tartrate 5 MG Oral Tablet] 30 tablet 0    Sig: TAKE 1 TABLET BY MOUTH AT BEDTIME AS NEEDED FOR SLEEP     Not Delegated - Psychiatry:  Anxiolytics/Hypnotics Failed - 11/01/2023  9:09 AM      Failed - This refill cannot be delegated      Failed - Urine Drug Screen completed in last 360 days      Passed - Valid encounter within last 6 months    Recent Outpatient Visits           3 weeks ago Type 2 diabetes mellitus with hyperglycemia, without long-term current use of insulin (HCC)   [place] [facility], Megan P, DO   1 month ago BV (bacterial vaginosis)   [place] [facility] [place], [place], DO

## 2023-11-02 ENCOUNTER — Ambulatory Visit: Admitting: Family Medicine

## 2023-11-02 ENCOUNTER — Encounter: Payer: Self-pay | Admitting: Family Medicine

## 2023-11-02 VITALS — BP 137/81 | HR 99 | Temp 99.4°F | Ht 65.1 in | Wt 200.4 lb

## 2023-11-02 DIAGNOSIS — N904 Leukoplakia of vulva: Secondary | ICD-10-CM | POA: Insufficient documentation

## 2023-11-02 MED ORDER — CLOBETASOL PROPIONATE 0.05 % EX OINT: 1.0000 | TOPICAL_OINTMENT | Freq: Two times a day (BID) | CUTANEOUS | 0 refills | Status: DC

## 2023-11-02 MED ORDER — ZOLPIDEM TARTRATE 5 MG PO TABS: 5.0000 mg | ORAL_TABLET | Freq: Every evening | ORAL | 2 refills | Status: DC | PRN

## 2023-11-02 MED ORDER — VALACYCLOVIR HCL 1 G PO TABS: 1000.0000 mg | ORAL_TABLET | Freq: Two times a day (BID) | ORAL | 3 refills | Status: DC

## 2023-11-02 NOTE — Assessment & Plan Note (Signed)
 Will treat with clobetasol. Call with any concerns. Recheck 2 months.

## 2023-11-02 NOTE — Progress Notes (Signed)
 BP 137/81 (BP Location: Left Arm, Patient Position: Sitting, Cuff Size: Normal)   Pulse 99   Temp 99.4 F (37.4 C) (Oral)   Ht 5' 5.1" (1.654 m)   Wt 200 lb 6.4 oz (90.9 kg)   LMP  (LMP Unknown)   SpO2 99%   BMI 33.25 kg/m    Subjective:    Patient ID: Maria Little, female    DOB: 08-25-1975, 48 y.o.   MRN: [id number]  HPI: Maria Little is a 48 y.o. female  Chief Complaint  Patient presents with   Vaginal Itching    Pt complains of vaginal burning also. Pt states the onset has been about a few months ago. No relief.    Vaginal Pain   VAGINAL ITCHING Duration: months Discharge description: slight  Pruritus: yes Dysuria: yes Malodorous: yes Urinary frequency: yes Fevers: no Abdominal pain: no  Sexual activity: practicing safe sex History of sexually transmitted diseases: yes Recent antibiotic use: no Context: worse  Treatments attempted: vagisil   Relevant past medical, surgical, family and social history reviewed and updated as indicated. Interim medical history since our last visit reviewed. Allergies and medications reviewed and updated.  Review of Systems  Constitutional: Negative.   Respiratory: Negative.    Cardiovascular: Negative.   Gastrointestinal: Negative.   Genitourinary:  Positive for genital sores, vaginal discharge and vaginal pain. Negative for decreased urine volume, difficulty urinating, dyspareunia, dysuria, enuresis, flank pain, frequency, hematuria, menstrual problem, pelvic pain, urgency and vaginal bleeding.  Musculoskeletal: Negative.   Psychiatric/Behavioral: Negative.      Per HPI unless specifically indicated above     Objective:    BP 137/81 (BP Location: Left Arm, Patient Position: Sitting, Cuff Size: Normal)   Pulse 99   Temp 99.4 F (37.4 C) (Oral)   Ht 5' 5.1" (1.654 m)   Wt 200 lb 6.4 oz (90.9 kg)   LMP  (LMP Unknown)   SpO2 99%   BMI 33.25 kg/m   Wt Readings from Last 3 Encounters:  11/02/23 200 lb 6.4  oz (90.9 kg)  10/06/23 198 lb (89.8 kg)  09/05/23 196 lb 12.8 oz (89.3 kg)    Physical Exam Vitals and nursing note reviewed.  Constitutional:      General: She is not in acute distress.    Appearance: Normal appearance. She is not ill-appearing, toxic-appearing or diaphoretic.  HENT:     Head: Normocephalic and atraumatic.     Right Ear: External ear normal.     Left Ear: External ear normal.     Nose: Nose normal.     Mouth/Throat:     Mouth: Mucous membranes are moist.     Pharynx: Oropharynx is clear.  Eyes:     General: No scleral icterus.       Right eye: No discharge.        Left eye: No discharge.     Extraocular Movements: Extraocular movements intact.     Conjunctiva/sclera: Conjunctivae normal.     Pupils: Pupils are equal, round, and reactive to light.  Cardiovascular:     Rate and Rhythm: Normal rate and regular rhythm.     Pulses: Normal pulses.     Heart sounds: Normal heart sounds. No murmur heard.    No friction rub. No gallop.  Pulmonary:     Effort: Pulmonary effort is normal. No respiratory distress.     Breath sounds: Normal breath sounds. No stridor. No wheezing, rhonchi or rales.  Chest:  Chest wall: No tenderness.  Genitourinary:    Comments: White patches on vulva and perineum on picture Musculoskeletal:        General: Normal range of motion.     Cervical back: Normal range of motion and neck supple.  Skin:    General: Skin is warm and dry.     Capillary Refill: Capillary refill takes less than 2 seconds.     Coloration: Skin is not jaundiced or pale.     Findings: No bruising, erythema, lesion or rash.  Neurological:     General: No focal deficit present.     Mental Status: She is alert and oriented to person, place, and time. Mental status is at baseline.  Psychiatric:        Mood and Affect: Mood normal.        Behavior: Behavior normal.        Thought Content: Thought content normal.        Judgment: Judgment normal.     Results  for orders placed or performed in visit on 10/06/23  Bayer DCA Hb A1c Waived (STAT)   Collection Time: 10/06/23  2:50 PM  Result Value Ref Range   HB A1C (BAYER DCA - WAIVED) 9.5 (H) 4.8 - 5.6 %      Assessment & Plan:   Problem List Items Addressed This Visit       Genitourinary   Lichen sclerosus of vulva - Primary   Will treat with clobetasol. Call with any concerns. Recheck 2 months.         Follow up plan: Return for As scheduled.

## 2023-11-04 ENCOUNTER — Other Ambulatory Visit: Payer: Self-pay | Admitting: Family Medicine

## 2023-11-04 NOTE — Telephone Encounter (Signed)
 Requested medications are due for refill today.  unsure  Requested medications are on the active medications list.  yes  Last refill. 09/05/2023   Future visit scheduled.   yes  Notes to clinic.  Medication is historical.    Requested Prescriptions  Pending Prescriptions Disp Refills   JARDIANCE  25 MG TABS tablet [Pharmacy Med Name: Jardiance  25 MG Oral Tablet] 30 tablet 0    Sig: TAKE 1 TABLET BY MOUTH ONCE DAILY BEFORE BREAKFAST     Endocrinology:  Diabetes - SGLT2 Inhibitors Failed - 11/04/2023  2:03 PM      Failed - HBA1C is between 0 and 7.9 and within 180 days    HB A1C (BAYER DCA - WAIVED)  Date Value Ref Range Status  10/06/2023 9.5 (H) 4.8 - 5.6 % Final    Comment:             Prediabetes: 5.7 - 6.4          Diabetes: >6.4          Glycemic control for adults with diabetes: <7.0          Passed - Cr in normal range and within 360 days    Creatinine  Date Value Ref Range Status  09/13/2011 0.79 0.60 - 1.30 mg/dL Final   Creatinine, Ser  Date Value Ref Range Status  06/30/2023 0.73 0.57 - 1.00 mg/dL Final         Passed - eGFR in normal range and within 360 days    EGFR (African American)  Date Value Ref Range Status  09/13/2011 >60 >70mL/min Final   GFR calc Af Amer  Date Value Ref Range Status  09/03/2020 123 >59 mL/min/1.73 Final    Comment:    **In accordance with recommendations from the NKF-ASN Task force,**   [facility] is in the process of updating its eGFR calculation to the   2021 CKD-EPI creatinine equation that estimates kidney function   without a race variable.    EGFR (Non-African Amer.)  Date Value Ref Range Status  09/13/2011 >60 >16mL/min Final    Comment:    eGFR values <34mL/min/1.73 m2 may be an indication of chronic kidney disease (CKD). Calculated eGFR, using the MRDR Study equation, is useful in  patients with stable renal function. The eGFR calculation will not be reliable in acutely ill patients when serum creatinine is  changing rapidly. It is not useful in patients on dialysis. The eGFR calculation may not be applicable to patients at the low and high extremes of body sizes, pregnant women, and vegetarians.    GFR, Estimated  Date Value Ref Range Status  10/31/2020 >60 >60 mL/min Final    Comment:    (NOTE) Calculated using the CKD-EPI Creatinine Equation (2021)    eGFR  Date Value Ref Range Status  06/30/2023 103 >59 mL/min/1.73 Final         Passed - Valid encounter within last 6 months    Recent Outpatient Visits           2 days ago Lichen sclerosus of vulva   [place] [facility] [place], Megan P, DO   4 weeks ago Type 2 diabetes mellitus with hyperglycemia, without long-term current use of insulin [facility])   [place] [facility] [place], Megan P, DO   2 months ago BV (bacterial vaginosis)   [place] [facility], Megan P, DO

## 2023-11-07 ENCOUNTER — Encounter: Payer: Self-pay | Admitting: Family Medicine

## 2023-11-10 ENCOUNTER — Other Ambulatory Visit: Payer: Self-pay | Admitting: Family Medicine

## 2023-11-10 DIAGNOSIS — N904 Leukoplakia of vulva: Secondary | ICD-10-CM

## 2023-11-16 ENCOUNTER — Ambulatory Visit: Payer: BC Managed Care – PPO

## 2023-11-17 ENCOUNTER — Ambulatory Visit

## 2023-11-17 DIAGNOSIS — Z3042 Encounter for surveillance of injectable contraceptive: Secondary | ICD-10-CM | POA: Diagnosis not present

## 2023-11-17 MED ORDER — CLOBETASOL PROPIONATE 0.05 % EX OINT: 1.0000 | TOPICAL_OINTMENT | Freq: Two times a day (BID) | CUTANEOUS | 3 refills | Status: DC

## 2023-11-17 NOTE — Progress Notes (Signed)
 Patient is in office today for a nurse visit for Birth Control Injection. Patient Injection was given in the  Right deltoid. Patient tolerated injection well.  Next injection due 02/02/2024-02/16/2024. If window is missed then patient must have negative pregnancy test to move forward.

## 2024-01-09 ENCOUNTER — Encounter: Admitting: Family Medicine

## 2024-01-26 IMAGING — CT CT HEAD W/O CM
4 series · 17 of 47 positions shown, 19 images · non-contrast
Comparison: None Available.

CLINICAL DATA: Vertigo, peripheral DM, HTN, R/o stroke



[Series 2: head wo · axial · 0.42mm/px · z∈[-106,-1]mm · 7 of 29 slices shown, 9 images]
[im 4/29  brain]
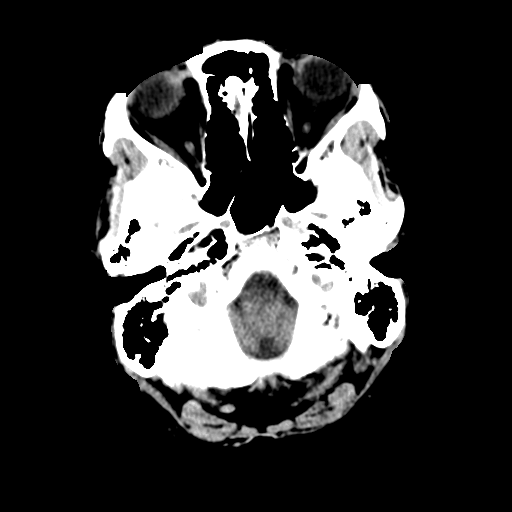
[im 4/29  bone]
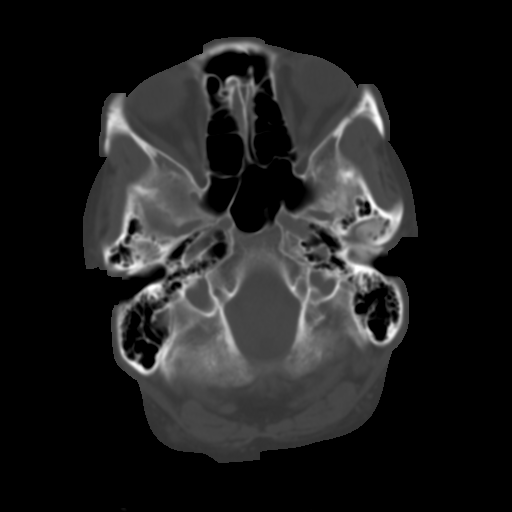
[im 8/29  brain]
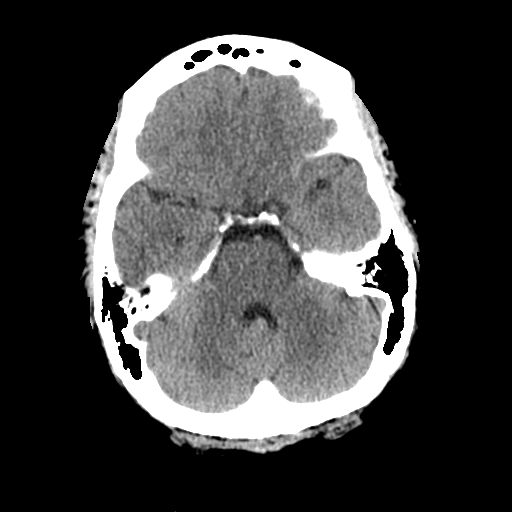
[im 11/29  brain]
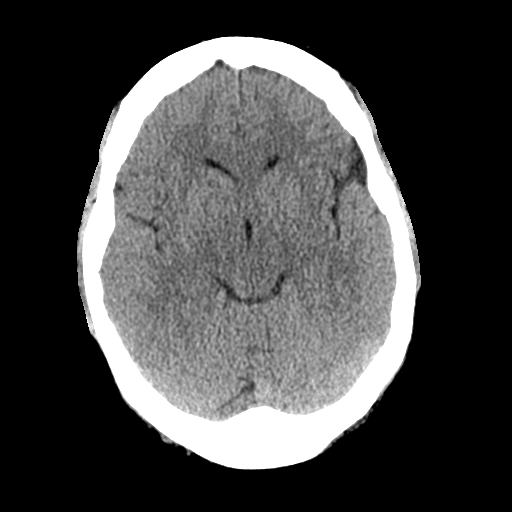
[im 15/29  brain]
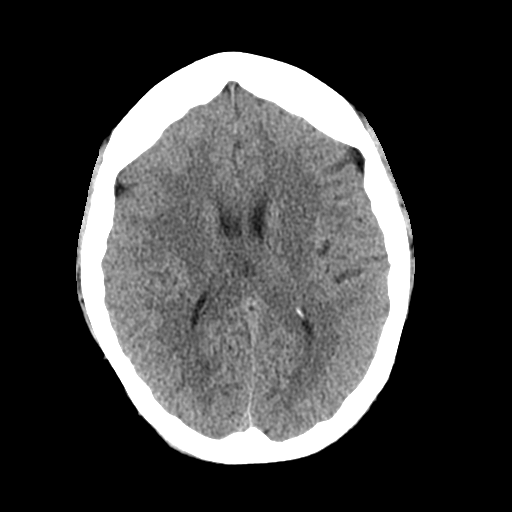
[im 18/29  brain]
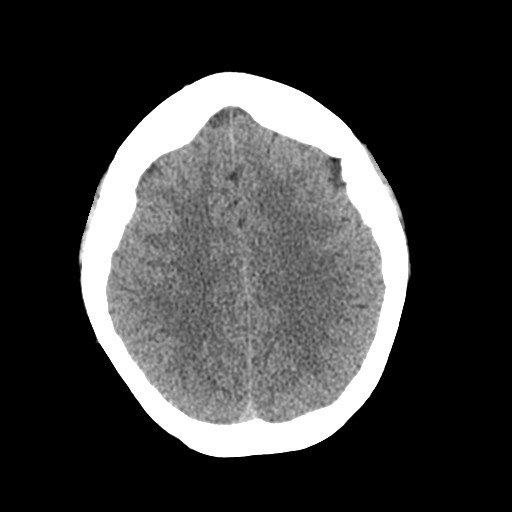
[im 18/29  bone]
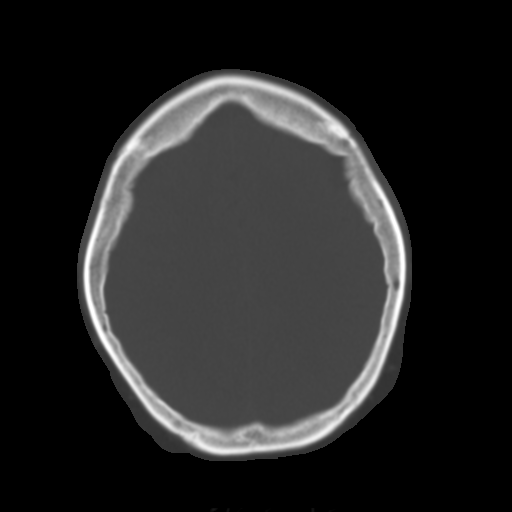
[im 22/29  brain]
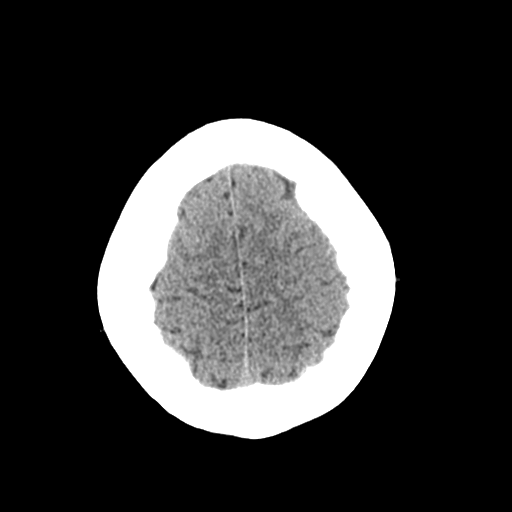
[im 25/29  brain]
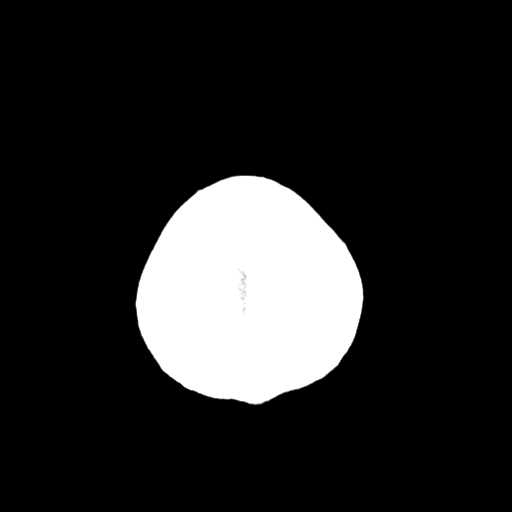

[Series 3: head bone · axial · 0.42mm/px · z∈[-107,-57]mm · 4 of 73 slices shown]
[im 8/73  bone]
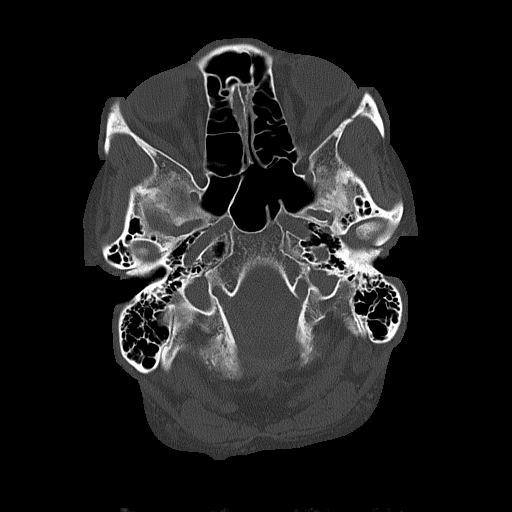
[im 15/73  bone]
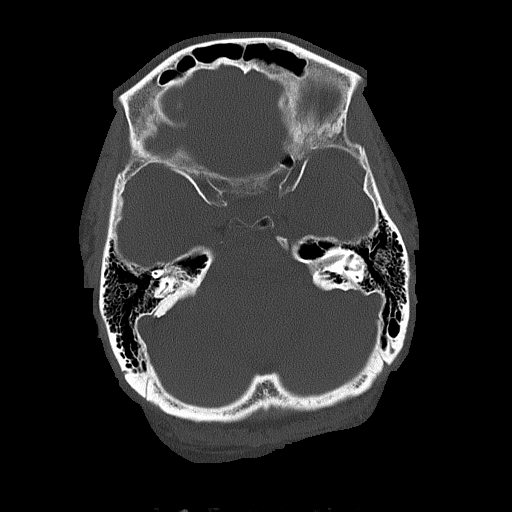
[im 22/73  bone]
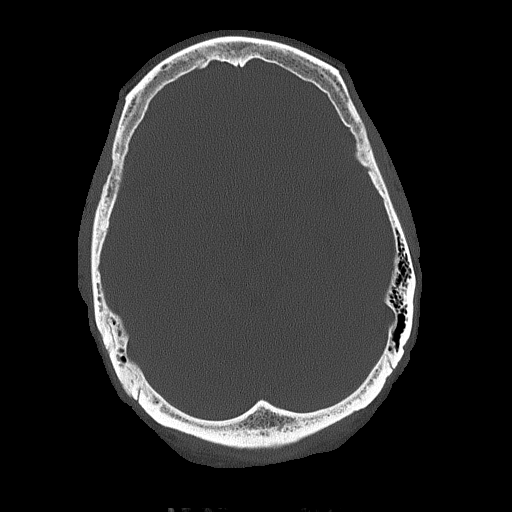
[im 33/73  bone]
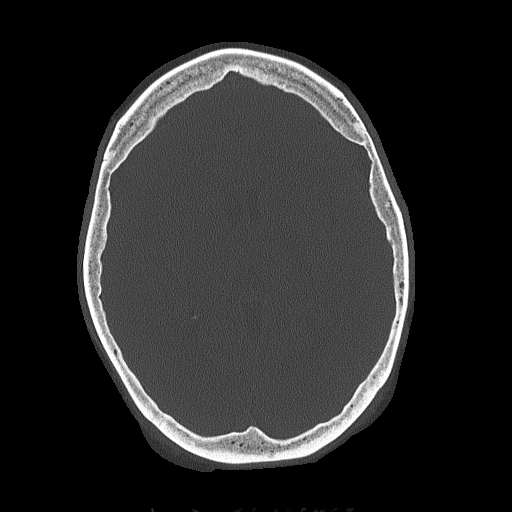

[Series 4: coronal soft tissue · coronal · 0.31mm/px · 3 of 66 slices shown]
[im 22/66  brain]
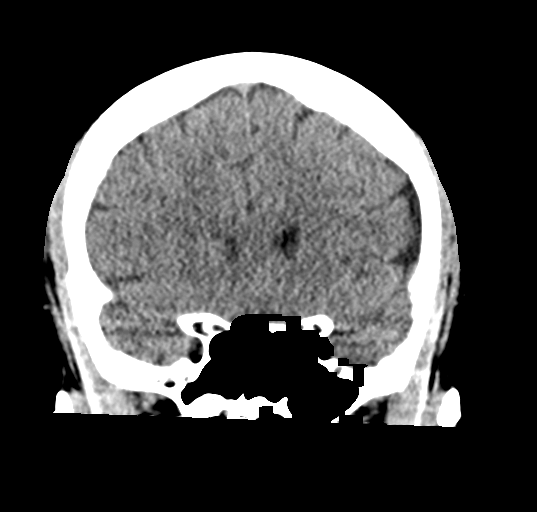
[im 29/66  brain]
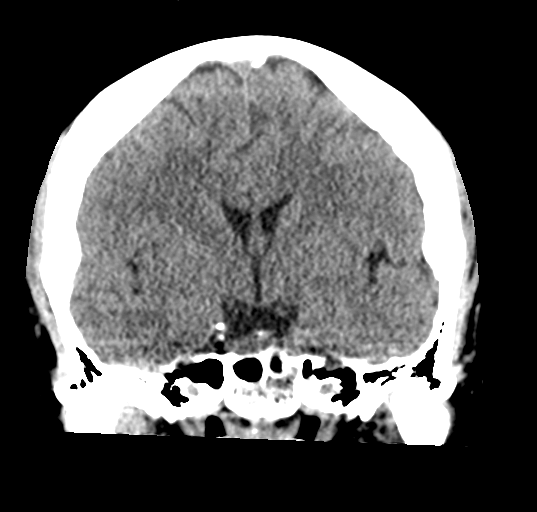
[im 37/66  brain]
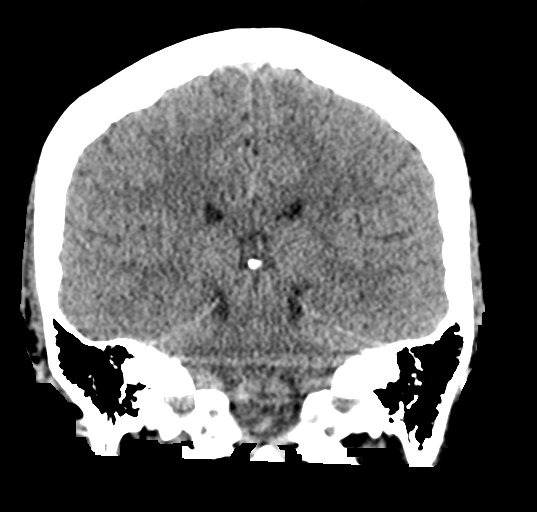

[Series 5: sagittal soft tissue · sagittal · 0.31mm/px · 3 of 57 slices shown]
[im 19/57  brain]
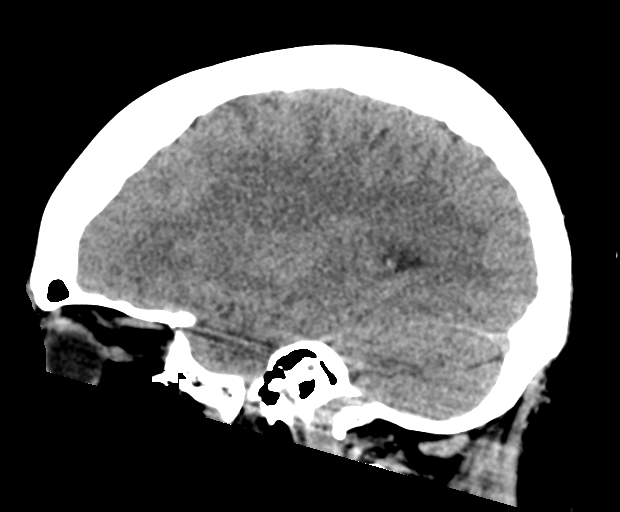
[im 29/57  brain]
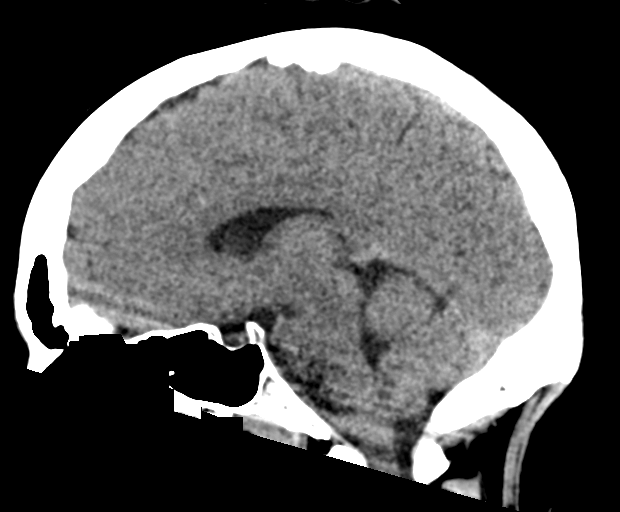
[im 38/57  brain]
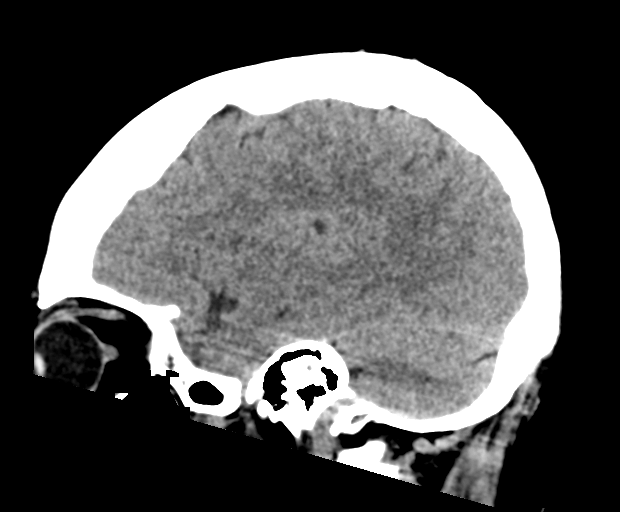

[17 of 47 positions shown; findings below may reference images not displayed]

FINDINGS: Brain: No evidence of acute infarction, hemorrhage, hydrocephalus,
extra-axial collection or mass lesion/mass effect.

Vascular: No hyperdense vessel identified.

Skull: No acute fracture.

Sinuses/Orbits: Clear sinuses.  No acute orbital findings.

Other: No mastoid effusions.
IMPRESSION: No evidence of acute intracranial abnormality.

## 2024-02-08 ENCOUNTER — Ambulatory Visit

## 2024-02-08 DIAGNOSIS — Z3042 Encounter for surveillance of injectable contraceptive: Secondary | ICD-10-CM

## 2024-02-08 NOTE — Progress Notes (Signed)
 Patient is in office today for a nurse visit for Birth Control Injection. Patient Injection was given in the  Right deltoid. Patient tolerated injection well.

## 2024-03-03 ENCOUNTER — Other Ambulatory Visit: Payer: Self-pay | Admitting: Family Medicine

## 2024-03-06 NOTE — Telephone Encounter (Signed)
 Requested medication (s) are due for refill today:   Yes for rosuvastatin .   Provider to review Ambien   Requested medication (s) are on the active medication list:   Yes for both  Future visit scheduled:   No      Last ordered: Ambien  11/02/2023 #30, 2 refills;   rosuvastatin  10/06/2023 #90, 0 refills  Non delegated refill     Requested Prescriptions  Pending Prescriptions Disp Refills   rosuvastatin  (CRESTOR ) 5 MG tablet [Pharmacy Med Name: Rosuvastatin  Calcium  5 MG Oral Tablet] 90 tablet 0    Sig: Take 1 tablet by mouth once daily     Cardiovascular:  Antilipid - Statins 2 Failed - 03/06/2024 12:09 PM      Failed - Lipid Panel in normal range within the last 12 months    Cholesterol, Total  Date Value Ref Range Status  06/30/2023 288 (H) 100 - 199 mg/dL Final   LDL Chol Calc (NIH)  Date Value Ref Range Status  06/30/2023 159 (H) 0 - 99 mg/dL Final   HDL  Date Value Ref Range Status  06/30/2023 53 >39 mg/dL Final   Triglycerides  Date Value Ref Range Status  06/30/2023 400 (H) 0 - 149 mg/dL Final         Passed - Cr in normal range and within 360 days    Creatinine  Date Value Ref Range Status  09/13/2011 0.79 0.60 - 1.30 mg/dL Final   Creatinine, Ser  Date Value Ref Range Status  06/30/2023 0.73 0.57 - 1.00 mg/dL Final         Passed - Patient is not pregnant      Passed - Valid encounter within last 12 months    Recent Outpatient Visits           4 months ago Lichen sclerosus of vulva   [place] [facility] [place], Megan P, DO   5 months ago Type 2 diabetes mellitus with hyperglycemia, without long-term current use of insulin (HCC)   [place] [facility] [place], Megan P, DO   6 months ago BV (bacterial vaginosis)   [place] [facility], Megan P, DO               zolpidem  (AMBIEN ) 5 MG tablet [Pharmacy Med Name: Zolpidem  Tartrate 5 MG Oral Tablet] 30 tablet 0    Sig: TAKE 1 TABLET BY MOUTH AT  BEDTIME AS NEEDED FOR SLEEP     Not Delegated - Psychiatry:  Anxiolytics/Hypnotics Failed - 03/06/2024 12:09 PM      Failed - This refill cannot be delegated      Failed - Urine Drug Screen completed in last 360 days      Passed - Valid encounter within last 6 months    Recent Outpatient Visits           4 months ago Lichen sclerosus of vulva   [place] [facility] [place], Megan P, DO   5 months ago Type 2 diabetes mellitus with hyperglycemia, without long-term current use of insulin (HCC)   [place] [facility] [place], Megan P, DO   6 months ago BV (bacterial vaginosis)   [place] [facility] [place], [place], DO

## 2024-03-15 ENCOUNTER — Other Ambulatory Visit: Payer: Self-pay

## 2024-03-16 NOTE — Telephone Encounter (Signed)
 Over due for follow up

## 2024-03-20 MED ORDER — ROSUVASTATIN CALCIUM 5 MG PO TABS: 5.0000 mg | ORAL_TABLET | Freq: Every day | ORAL | 0 refills | Status: DC

## 2024-03-20 NOTE — Addendum Note (Signed)
 Addended by: VICCI DUWAINE SQUIBB on: 03/20/2024 09:27 AM   Modules accepted: Orders

## 2024-03-28 ENCOUNTER — Other Ambulatory Visit: Payer: Self-pay | Admitting: Family Medicine

## 2024-03-29 NOTE — Telephone Encounter (Signed)
 Requested medications are due for refill today.  Too soon  Requested medications are on the active medications list.  yes  Last refill. 11/02/2023 #30 2 rf  Future visit scheduled.   yes  Notes to clinic.  Refill/refusal not delegated.    Requested Prescriptions  Pending Prescriptions Disp Refills   zolpidem  (AMBIEN ) 5 MG tablet [Pharmacy Med Name: Zolpidem  Tartrate 5 MG Oral Tablet] 30 tablet 0    Sig: TAKE 1 TABLET BY MOUTH AT BEDTIME AS NEEDED FOR SLEEP     Not Delegated - Psychiatry:  Anxiolytics/Hypnotics Failed - 03/29/2024  3:54 PM      Failed - This refill cannot be delegated      Failed - Urine Drug Screen completed in last 360 days      Passed - Valid encounter within last 6 months    Recent Outpatient Visits           4 months ago Lichen sclerosus of vulva   [place] [facility] [place], Megan P, DO   5 months ago Type 2 diabetes mellitus with hyperglycemia, without long-term current use of insulin (HCC)   [place] [facility] [place], Megan P, DO   6 months ago BV (bacterial vaginosis)   [place] [facility] [place], [place], DO

## 2024-04-01 ENCOUNTER — Other Ambulatory Visit: Payer: Self-pay | Admitting: Family Medicine

## 2024-04-03 NOTE — Telephone Encounter (Signed)
 Requested medication (s) are due for refill today: yes  Requested medication (s) are on the active medication list: yes  Last refill:  11/02/23 #30 2 RF  Future visit scheduled: yes  Notes to clinic:  med not delegated to NT to RF   Requested Prescriptions  Pending Prescriptions Disp Refills   zolpidem  (AMBIEN ) 5 MG tablet [Pharmacy Med Name: Zolpidem  Tartrate 5 MG Oral Tablet] 30 tablet 0    Sig: TAKE 1 TABLET BY MOUTH AT BEDTIME AS NEEDED FOR SLEEP     Not Delegated - Psychiatry:  Anxiolytics/Hypnotics Failed - 04/03/2024 11:55 AM      Failed - This refill cannot be delegated      Failed - Urine Drug Screen completed in last 360 days      Passed - Valid encounter within last 6 months    Recent Outpatient Visits           5 months ago Lichen sclerosus of vulva   [place] [facility] [place], Megan P, DO   6 months ago Type 2 diabetes mellitus with hyperglycemia, without long-term current use of insulin (HCC)   [place] [facility] [place], Megan P, DO   7 months ago BV (bacterial vaginosis)   [place] [facility] [place], [place], DO

## 2024-05-02 ENCOUNTER — Telehealth: Payer: Self-pay

## 2024-05-02 ENCOUNTER — Encounter: Payer: Self-pay | Admitting: Family Medicine

## 2024-05-02 ENCOUNTER — Other Ambulatory Visit (HOSPITAL_COMMUNITY): Payer: Self-pay

## 2024-05-02 ENCOUNTER — Ambulatory Visit: Admitting: Family Medicine

## 2024-05-02 VITALS — BP 134/91 | HR 109 | Temp 98.0°F | Ht 65.0 in | Wt 190.8 lb

## 2024-05-02 DIAGNOSIS — Z3009 Encounter for other general counseling and advice on contraception: Secondary | ICD-10-CM

## 2024-05-02 DIAGNOSIS — Z1231 Encounter for screening mammogram for malignant neoplasm of breast: Secondary | ICD-10-CM

## 2024-05-02 DIAGNOSIS — E1165 Type 2 diabetes mellitus with hyperglycemia: Secondary | ICD-10-CM

## 2024-05-02 DIAGNOSIS — E782 Mixed hyperlipidemia: Secondary | ICD-10-CM | POA: Diagnosis not present

## 2024-05-02 DIAGNOSIS — Z23 Encounter for immunization: Secondary | ICD-10-CM

## 2024-05-02 DIAGNOSIS — Z3042 Encounter for surveillance of injectable contraceptive: Secondary | ICD-10-CM

## 2024-05-02 DIAGNOSIS — F331 Major depressive disorder, recurrent, moderate: Secondary | ICD-10-CM | POA: Diagnosis not present

## 2024-05-02 DIAGNOSIS — F5104 Psychophysiologic insomnia: Secondary | ICD-10-CM

## 2024-05-02 DIAGNOSIS — Z0000 Encounter for general adult medical examination without abnormal findings: Secondary | ICD-10-CM | POA: Diagnosis not present

## 2024-05-02 DIAGNOSIS — Z1211 Encounter for screening for malignant neoplasm of colon: Secondary | ICD-10-CM

## 2024-05-02 LAB — MICROALBUMIN, URINE WAIVED
Creatinine, Urine Waived: 200 mg/dL (ref 10–300)
Microalb, Ur Waived: 150 mg/L — ABNORMAL HIGH (ref 0–19)

## 2024-05-02 LAB — BAYER DCA HB A1C WAIVED: HB A1C (BAYER DCA - WAIVED): 9.8 % — ABNORMAL HIGH (ref 4.8–5.6)

## 2024-05-02 MED ORDER — OZEMPIC (0.25 OR 0.5 MG/DOSE) 2 MG/3ML ~~LOC~~ SOPN: PEN_INJECTOR | SUBCUTANEOUS | Status: AC

## 2024-05-02 MED ORDER — SERTRALINE HCL 100 MG PO TABS: 100.0000 mg | ORAL_TABLET | Freq: Every day | ORAL | 1 refills | Status: AC

## 2024-05-02 MED ORDER — SEMAGLUTIDE (1 MG/DOSE) 4 MG/3ML ~~LOC~~ SOPN: 1.0000 mg | PEN_INJECTOR | SUBCUTANEOUS | 0 refills | Status: DC

## 2024-05-02 MED ORDER — ROSUVASTATIN CALCIUM 5 MG PO TABS: 5.0000 mg | ORAL_TABLET | Freq: Every day | ORAL | 0 refills | Status: DC

## 2024-05-02 MED ORDER — METFORMIN HCL ER 500 MG PO TB24: 1000.0000 mg | ORAL_TABLET | Freq: Two times a day (BID) | ORAL | 1 refills | Status: AC

## 2024-05-02 MED ORDER — VALACYCLOVIR HCL 1 G PO TABS: 1000.0000 mg | ORAL_TABLET | Freq: Two times a day (BID) | ORAL | 3 refills | Status: DC

## 2024-05-02 MED ORDER — ARIPIPRAZOLE 5 MG PO TABS: 5.0000 mg | ORAL_TABLET | Freq: Every day | ORAL | 0 refills | Status: DC

## 2024-05-02 MED ORDER — OZEMPIC (0.25 OR 0.5 MG/DOSE) 2 MG/3ML ~~LOC~~ SOPN: PEN_INJECTOR | SUBCUTANEOUS | 0 refills | Status: AC

## 2024-05-02 MED ORDER — MEDROXYPROGESTERONE ACETATE 150 MG/ML IM SUSP
150.0000 mg | Freq: Once | INTRAMUSCULAR | Status: AC
  Administered 2024-05-02: 150 mg via INTRAMUSCULAR

## 2024-05-02 NOTE — Progress Notes (Signed)
   05/02/2024  Patient ID: Maria Little, female   DOB: 05-26-1976, 48 y.o.   MRN: [id number]  Contacted [facility] to check on coverage for Ozempic  0.25/0.5mg  sent in today by Dr. Vicci.  The pharmacy is unable to process a claim right now, because insurance is rejecting based on a test claim ran by PA team earlier today.  Patient was provided 1 pen sample today that will last 6 weeks, so I will follow-up with [facility] to check copay again 11/5 (when insurance states claim will go through -and test claim should be reversed by this time).  Maria Little, PharmD, DPLA

## 2024-05-02 NOTE — Progress Notes (Signed)
 BP (!) 134/91   Pulse (!) 109   Temp 98 F (36.7 C) (Oral)   Ht 5' 5 (1.651 m)   Wt 190 lb 12.8 oz (86.5 kg)   SpO2 99%   BMI 31.75 kg/m    Subjective:    Patient ID: Maria Little, female    DOB: 12-15-1975, 48 y.o.   MRN: [id number]  HPI: Maria Little is a 48 y.o. female presenting on 05/02/2024 for comprehensive medical examination. Current medical complaints include:  DIABETES Hypoglycemic episodes:no Polydipsia/polyuria: no Visual disturbance: no Chest pain: no Paresthesias: no Glucose Monitoring: no  Accucheck frequency: Not Checking Taking Insulin?: no Blood Pressure Monitoring: not checking Retinal Examination: Not up to Date Foot Exam: Up to Date Diabetic Education: Completed Pneumovax: Up to Date Influenza: Up to Date Aspirin: no  HYPERLIPIDEMIA Hyperlipidemia status: fair compliance Satisfied with current treatment?  yes Side effects:  no Medication compliance: fair compliance Past cholesterol meds: crestor  Supplements: none Aspirin:  no The 10-year ASCVD risk score (Arnett DK, et al., 2019) is: 3.1%   Values used to calculate the score:     Age: 63 years     Clincally relevant sex: Female     Is Non-Hispanic African American: Yes     Diabetic: Yes     Tobacco smoker: No     Systolic Blood Pressure: 134 mmHg     Is BP treated: No     HDL Cholesterol: 66 mg/dL     Total Cholesterol: 213 mg/dL Chest pain:  no Coronary artery disease:  no Family history CAD:  yes  ANXIETY/DEPRESSION Duration: chronic Status:uncontrolled Anxious mood: yes  Excessive worrying: yes Irritability: yes  Sweating: no Nausea: no Palpitations:no Hyperventilation: no Panic attacks: no Agoraphobia: no  Obscessions/compulsions: no Depressed mood: no    05/02/2024    9:00 AM 11/02/2023    9:18 AM 10/06/2023    3:01 PM 09/05/2023   10:39 AM 02/14/2023    2:34 PM  Depression screen PHQ 2/9  Decreased Interest 0 0 0 0 0  Down, Depressed, Hopeless 0 0 0 1  3  PHQ - 2 Score 0 0 0 1 3  Altered sleeping 3 1 0 3 3  Tired, decreased energy 0 0 0 1   Change in appetite 1 0 0 1 1  Feeling bad or failure about yourself  0 0 0 0   Trouble concentrating 0 0 0 0   Moving slowly or fidgety/restless 0 0 0 0   Suicidal thoughts 0 0 0 0   PHQ-9 Score 4 1 0 6 7  Difficult doing work/chores Not difficult at all Not difficult at all  Not difficult at all Somewhat difficult   Anhedonia: no Weight changes: no Insomnia: no   Hypersomnia: no Fatigue/loss of energy: yes Feelings of worthlessness: no Feelings of guilt: no Impaired concentration/indecisiveness: no Suicidal ideations: no  Crying spells: no Recent Stressors/Life Changes: no   Relationship problems: no   Family stress: no     Financial stress: no    Job stress: no    Recent death/loss: no  INSOMNIA Duration: chronic Satisfied with sleep quality: yes Difficulty falling asleep: yes Difficulty staying asleep: no Waking a few hours after sleep onset: no Early morning awakenings: no Daytime hypersomnolence: no Wakes feeling refreshed: no Good sleep hygiene: no Apnea: no Snoring: no Depressed/anxious mood: yes Recent stress: yes Restless legs/nocturnal leg cramps: no Chronic pain/arthritis: no History of sleep study: no Treatments attempted: melatonin, uinsom, benadryl ,  and ambien    Menopausal Symptoms: no  Depression Screen done today and results listed below:     05/02/2024    9:00 AM 11/02/2023    9:18 AM 10/06/2023    3:01 PM 09/05/2023   10:39 AM 02/14/2023    2:34 PM  Depression screen PHQ 2/9  Decreased Interest 0 0 0 0 0  Down, Depressed, Hopeless 0 0 0 1 3  PHQ - 2 Score 0 0 0 1 3  Altered sleeping 3 1 0 3 3  Tired, decreased energy 0 0 0 1   Change in appetite 1 0 0 1 1  Feeling bad or failure about yourself  0 0 0 0   Trouble concentrating 0 0 0 0   Moving slowly or fidgety/restless 0 0 0 0   Suicidal thoughts 0 0 0 0   PHQ-9 Score 4 1 0 6 7  Difficult doing  work/chores Not difficult at all Not difficult at all  Not difficult at all Somewhat difficult    Past Medical History:  Past Medical History:  Diagnosis Date   Anxiety March 2018   Depression March 2018   Diabetes mellitus without complication (HCC)    GERD (gastroesophageal reflux disease) March 2018   Hyperlipidemia    Insomnia    Migraine    Seizures (HCC)     Surgical History:  Past Surgical History:  Procedure Laterality Date   BREAST BIOPSY Right    neg   BREAST SURGERY     Lumpectomy neg    Medications:  Current Outpatient Medications on File Prior to Visit  Medication Sig   clobetasol  ointment (TEMOVATE ) 0.05 % Apply 1 Application topically 2 (two) times daily.   clobetasol  ointment (TEMOVATE ) 0.05 % Apply 1 Application topically 2 (two) times daily.   No current facility-administered medications on file prior to visit.    Allergies:  No Known Allergies  Social History:  Social History   Socioeconomic History   Marital status: Single    Spouse name: Not on file   Number of children: Not on file   Years of education: Not on file   Highest education level: Associate degree: academic program  Occupational History   Not on file  Tobacco Use   Smoking status: Never   Smokeless tobacco: Never  Vaping Use   Vaping status: Never Used  Substance and Sexual Activity   Alcohol use: Yes    Alcohol/week: 2.0 standard drinks of alcohol    Types: 2 Glasses of wine per week   Drug use: No   Sexual activity: Never  Other Topics Concern   Not on file  Social History Narrative   Not on file   Social Drivers of Health   Financial Resource Strain: Low Risk  (08/12/2023)   Overall Financial Resource Strain (CARDIA)    Difficulty of Paying Living Expenses: Not very hard  Recent Concern: Financial Resource Strain - Medium Risk (06/27/2023)   Overall Financial Resource Strain (CARDIA)    Difficulty of Paying Living Expenses: Somewhat hard  Food Insecurity: No  Food Insecurity (08/12/2023)   Hunger Vital Sign    Worried About Running Out of Food in the Last Year: Never true    Ran Out of Food in the Last Year: Never true  Transportation Needs: No Transportation Needs (08/12/2023)   PRAPARE - Administrator, Civil Service (Medical): No    Lack of Transportation (Non-Medical): No  Physical Activity: Unknown (08/12/2023)   Exercise Vital  Sign    Days of Exercise per Week: 0 days    Minutes of Exercise per Session: Not on file  Stress: Stress Concern Present (08/12/2023)   [employer] of Occupational Health - Occupational Stress Questionnaire    Feeling of Stress : Very much  Social Connections: Socially Isolated (08/12/2023)   Social Connection and Isolation Panel    Frequency of Communication with Friends and Family: More than three times a week    Frequency of Social Gatherings with Friends and Family: Once a week    Attends Religious Services: Never    Database administrator or Organizations: No    Attends Engineer, structural: Not on file    Marital Status: Never married  Catering manager Violence: Not on file   Social History   Tobacco Use  Smoking Status Never  Smokeless Tobacco Never   Social History   Substance and Sexual Activity  Alcohol Use Yes   Alcohol/week: 2.0 standard drinks of alcohol   Types: 2 Glasses of wine per week    Family History:  Family History  Problem Relation Age of Onset   Diabetes Mother    Hyperlipidemia Mother    Hypertension Mother    Hypertension Father    Alcohol abuse Father    Drug abuse Father    Hypertension Brother    COPD Brother    Breast cancer Maternal Aunt 87   Kidney disease Maternal Uncle     Past medical history, surgical history, medications, allergies, family history and social history reviewed with patient today and changes made to appropriate areas of the chart.   Review of Systems  Constitutional: Negative.   HENT: Negative.    Eyes: Negative.    Respiratory:  Positive for shortness of breath. Negative for cough, hemoptysis, sputum production and wheezing.   Cardiovascular: Negative.   Gastrointestinal:  Positive for heartburn. Negative for abdominal pain, blood in stool, constipation, diarrhea, melena, nausea and vomiting.  Genitourinary: Negative.   Musculoskeletal: Negative.   Skin: Negative.   Neurological:  Positive for tingling. Negative for dizziness, tremors, sensory change, speech change, focal weakness, seizures, loss of consciousness, weakness and headaches.  Psychiatric/Behavioral: Negative.     All other ROS negative except what is listed above and in the HPI.      Objective:    BP (!) 134/91   Pulse (!) 109   Temp 98 F (36.7 C) (Oral)   Ht 5' 5 (1.651 m)   Wt 190 lb 12.8 oz (86.5 kg)   SpO2 99%   BMI 31.75 kg/m   Wt Readings from Last 3 Encounters:  05/02/24 190 lb 12.8 oz (86.5 kg)  11/02/23 200 lb 6.4 oz (90.9 kg)  10/06/23 198 lb (89.8 kg)    Physical Exam Vitals and nursing note reviewed.  Constitutional:      General: She is not in acute distress.    Appearance: Normal appearance. She is obese. She is not ill-appearing, toxic-appearing or diaphoretic.  HENT:     Head: Normocephalic and atraumatic.     Right Ear: Tympanic membrane, ear canal and external ear normal. There is no impacted cerumen.     Left Ear: Tympanic membrane, ear canal and external ear normal. There is no impacted cerumen.     Nose: Nose normal. No congestion or rhinorrhea.     Mouth/Throat:     Mouth: Mucous membranes are moist.     Pharynx: Oropharynx is clear. No oropharyngeal exudate or posterior oropharyngeal erythema.  Eyes:     General: No scleral icterus.       Right eye: No discharge.        Left eye: No discharge.     Extraocular Movements: Extraocular movements intact.     Conjunctiva/sclera: Conjunctivae normal.     Pupils: Pupils are equal, round, and reactive to light.  Neck:     Vascular: No carotid  bruit.  Cardiovascular:     Rate and Rhythm: Normal rate and regular rhythm.     Pulses: Normal pulses.     Heart sounds: No murmur heard.    No friction rub. No gallop.  Pulmonary:     Effort: Pulmonary effort is normal. No respiratory distress.     Breath sounds: Normal breath sounds. No stridor. No wheezing, rhonchi or rales.  Chest:     Chest wall: No tenderness.  Abdominal:     General: Abdomen is flat. Bowel sounds are normal. There is no distension.     Palpations: Abdomen is soft. There is no mass.     Tenderness: There is no abdominal tenderness. There is no right CVA tenderness, left CVA tenderness, guarding or rebound.     Hernia: No hernia is present.  Genitourinary:    Comments: Breast and pelvic exams deferred with shared decision making Musculoskeletal:        General: No swelling, tenderness, deformity or signs of injury.     Cervical back: Normal range of motion and neck supple. No rigidity. No muscular tenderness.     Right lower leg: No edema.     Left lower leg: No edema.  Lymphadenopathy:     Cervical: No cervical adenopathy.  Skin:    General: Skin is warm and dry.     Capillary Refill: Capillary refill takes less than 2 seconds.     Coloration: Skin is not jaundiced or pale.     Findings: No bruising, erythema, lesion or rash.  Neurological:     General: No focal deficit present.     Mental Status: She is alert and oriented to person, place, and time. Mental status is at baseline.     Cranial Nerves: No cranial nerve deficit.     Sensory: No sensory deficit.     Motor: No weakness.     Coordination: Coordination normal.     Gait: Gait normal.     Deep Tendon Reflexes: Reflexes normal.  Psychiatric:        Mood and Affect: Mood normal.        Behavior: Behavior normal.        Thought Content: Thought content normal.        Judgment: Judgment normal.     Results for orders placed or performed in visit on 05/02/24  Bayer DCA Hb A1c Waived    Collection Time: 05/02/24  9:03 AM  Result Value Ref Range   HB A1C (BAYER DCA - WAIVED) 9.8 (H) 4.8 - 5.6 %  Microalbumin, Urine Waived   Collection Time: 05/02/24  9:03 AM  Result Value Ref Range   Microalb, Ur Waived 150 (H) 0 - 19 mg/L   Creatinine, Urine Waived 200 10 - 300 mg/dL   Microalb/Creat Ratio 30-300 (H) <30 mg/g  CBC with Differential/Platelet   Collection Time: 05/02/24  9:04 AM  Result Value Ref Range   WBC 6.5 3.4 - 10.8 x10E3/uL   RBC 4.80 3.77 - 5.28 x10E6/uL   Hemoglobin 11.8 11.1 - 15.9 g/dL   Hematocrit 58.9 65.9 -  46.6 %   MCV 85 79 - 97 fL   MCH 24.6 (L) 26.6 - 33.0 pg   MCHC 28.8 (L) 31.5 - 35.7 g/dL   RDW 86.3 88.2 - 84.5 %   Platelets 440 150 - 450 x10E3/uL   Neutrophils 53 Not Estab. %   Lymphs 37 Not Estab. %   Monocytes 8 Not Estab. %   Eos 1 Not Estab. %   Basos 1 Not Estab. %   Neutrophils Absolute 3.4 1.4 - 7.0 x10E3/uL   Lymphocytes Absolute 2.4 0.7 - 3.1 x10E3/uL   Monocytes Absolute 0.5 0.1 - 0.9 x10E3/uL   EOS (ABSOLUTE) 0.1 0.0 - 0.4 x10E3/uL   Basophils Absolute 0.0 0.0 - 0.2 x10E3/uL   Immature Granulocytes 0 Not Estab. %   Immature Grans (Abs) 0.0 0.0 - 0.1 x10E3/uL  Comprehensive metabolic panel with GFR   Collection Time: 05/02/24  9:04 AM  Result Value Ref Range   Glucose 265 (H) 70 - 99 mg/dL   BUN 4 (L) 6 - 24 mg/dL   Creatinine, Ser 9.29 0.57 - 1.00 mg/dL   eGFR 892 >40 fO/fpw/8.26   BUN/Creatinine Ratio 6 (L) 9 - 23   Sodium 137 134 - 144 mmol/L   Potassium 3.7 3.5 - 5.2 mmol/L   Chloride 98 96 - 106 mmol/L   CO2 22 20 - 29 mmol/L   Calcium  9.2 8.7 - 10.2 mg/dL   Total Protein 7.0 6.0 - 8.5 g/dL   Albumin 3.8 (L) 3.9 - 4.9 g/dL   Globulin, Total 3.2 1.5 - 4.5 g/dL   Bilirubin Total 0.3 0.0 - 1.2 mg/dL   Alkaline Phosphatase 128 (H) 41 - 116 IU/L   AST 25 0 - 40 IU/L   ALT 15 0 - 32 IU/L  Lipid Panel w/o Chol/HDL Ratio   Collection Time: 05/02/24  9:04 AM  Result Value Ref Range   Cholesterol, Total 213 (H) 100 -  199 mg/dL   Triglycerides 848 (H) 0 - 149 mg/dL   HDL 66 >60 mg/dL   VLDL Cholesterol Cal 26 5 - 40 mg/dL   LDL Chol Calc (NIH) 878 (H) 0 - 99 mg/dL  TSH   Collection Time: 05/02/24  9:04 AM  Result Value Ref Range   TSH 0.561 0.450 - 4.500 uIU/mL      Assessment & Plan:   Problem List Items Addressed This Visit       Endocrine   Type 2 diabetes mellitus with hyperglycemia, without long-term current use of insulin (HCC)   Not doing well with A1c of 9.8. Will start her on ozempic  and recheck in about 6 weeks. Will get pharmacy involved. Call with any concerns.       Relevant Medications   metFORMIN  (GLUCOPHAGE -XR) 500 MG 24 hr tablet   rosuvastatin  (CRESTOR ) 5 MG tablet   Semaglutide ,0.25 or 0.5MG /DOS, (OZEMPIC , 0.25 OR 0.5 MG/DOSE,) 2 MG/3ML SOPN   Semaglutide ,0.25 or 0.5MG /DOS, (OZEMPIC , 0.25 OR 0.5 MG/DOSE,) 2 MG/3ML SOPN   Semaglutide , 1 MG/DOSE, 4 MG/3ML SOPN (Start on 05/30/2024)   Other Relevant Orders   Bayer DCA Hb A1c Waived (Completed)   CBC with Differential/Platelet (Completed)   Comprehensive metabolic panel with GFR (Completed)   Lipid Panel w/o Chol/HDL Ratio (Completed)   TSH (Completed)   Microalbumin, Urine Waived (Completed)   AMB Referral [facility] Care Management     Other   Depression, major, recurrent, moderate (HCC)   Stable. Continue to monitor. Refills given today. Call with any concerns.  Relevant Medications   sertraline  (ZOLOFT ) 100 MG tablet   Hyperlipidemia   Under good control on current regimen. Continue current regimen. Continue to monitor. Call with any concerns. Refills given. Labs drawn today.       Relevant Medications   rosuvastatin  (CRESTOR ) 5 MG tablet   Other Relevant Orders   Comprehensive metabolic panel with GFR (Completed)   Lipid Panel w/o Chol/HDL Ratio (Completed)   Psychophysiological insomnia   Stable. Continue to monitor. Refills given today. Call with any concerns.       Other Visit Diagnoses        Routine general medical examination at a health care facility    -  Primary   Vaccines updated. Screening labs checked today. Pap up to date. Mammogram and colonoscopy ordered. Continue diet and exercise. Call with any concerns.     Encounter for management and injection of depo-Provera        Depo given today.   Relevant Medications   medroxyPROGESTERone  (DEPO-PROVERA ) injection 150 mg (Completed)     Encounter for vaccination       Flu and prevnar given today.   Relevant Orders   Flu vaccine trivalent PF, 6mos and older(Flulaval,Afluria,Fluarix,Fluzone) (Completed)   Pneumococcal conjugate vaccine 20-valent (Prevnar 20) (Completed)     Encounter for screening mammogram for malignant neoplasm of breast       Mammogram ordered today.   Relevant Orders   MM 3D SCREENING MAMMOGRAM BILATERAL BREAST     Screening for colon cancer       Referral to GI placed today.   Relevant Orders   Ambulatory referral to Gastroenterology        Follow up plan: Return in about 6 weeks (around 06/13/2024).   LABORATORY TESTING:  - Pap smear: pap done  IMMUNIZATIONS:   - Tdap: Tetanus vaccination status reviewed: last tetanus booster within 10 years. - Influenza: Administered today - Prevnar: Administered today - COVID: Refused - HPV: Not applicable - Shingrix vaccine: Not applicable  SCREENING: -Mammogram: Ordered today  - Colonoscopy: Ordered today   PATIENT COUNSELING:   Advised to take 1 mg of folate supplement per day if capable of pregnancy.   Sexuality: Discussed sexually transmitted diseases, partner selection, use of condoms, avoidance of unintended pregnancy  and contraceptive alternatives.   Advised to avoid cigarette smoking.  I discussed with the patient that most people either abstain from alcohol or drink within safe limits (<=14/week and <=4 drinks/occasion for males, <=7/weeks and <= 3 drinks/occasion for females) and that the risk for alcohol disorders and other health  effects rises proportionally with the number of drinks per week and how often a drinker exceeds daily limits.  Discussed cessation/primary prevention of drug use and availability of treatment for abuse.   Diet: Encouraged to adjust caloric intake to maintain  or achieve ideal body weight, to reduce intake of dietary saturated fat and total fat, to limit sodium intake by avoiding high sodium foods and not adding table salt, and to maintain adequate dietary potassium and calcium  preferably from fresh fruits, vegetables, and low-fat dairy products.    stressed the importance of regular exercise  Injury prevention: Discussed safety belts, safety helmets, smoke detector, smoking near bedding or upholstery.   Dental health: Discussed importance of regular tooth brushing, flossing, and dental visits.    NEXT PREVENTATIVE PHYSICAL DUE IN 1 YEAR. Return in about 6 weeks (around 06/13/2024).

## 2024-05-03 ENCOUNTER — Other Ambulatory Visit: Payer: Self-pay | Admitting: Family Medicine

## 2024-05-03 ENCOUNTER — Telehealth: Payer: Self-pay

## 2024-05-03 LAB — CBC WITH DIFFERENTIAL/PLATELET
Basophils Absolute: 0 x10E3/uL (ref 0.0–0.2)
Basos: 1 %
EOS (ABSOLUTE): 0.1 x10E3/uL (ref 0.0–0.4)
Eos: 1 %
Hematocrit: 41 % (ref 34.0–46.6)
Hemoglobin: 11.8 g/dL (ref 11.1–15.9)
Immature Grans (Abs): 0 x10E3/uL (ref 0.0–0.1)
Immature Granulocytes: 0 %
Lymphocytes Absolute: 2.4 x10E3/uL (ref 0.7–3.1)
Lymphs: 37 %
MCH: 24.6 pg — ABNORMAL LOW (ref 26.6–33.0)
MCHC: 28.8 g/dL — ABNORMAL LOW (ref 31.5–35.7)
MCV: 85 fL (ref 79–97)
Monocytes Absolute: 0.5 x10E3/uL (ref 0.1–0.9)
Monocytes: 8 %
Neutrophils Absolute: 3.4 x10E3/uL (ref 1.4–7.0)
Neutrophils: 53 %
Platelets: 440 x10E3/uL (ref 150–450)
RBC: 4.8 x10E6/uL (ref 3.77–5.28)
RDW: 13.6 % (ref 11.7–15.4)
WBC: 6.5 x10E3/uL (ref 3.4–10.8)

## 2024-05-03 LAB — COMPREHENSIVE METABOLIC PANEL WITH GFR
ALT: 15 IU/L (ref 0–32)
AST: 25 IU/L (ref 0–40)
Albumin: 3.8 g/dL — ABNORMAL LOW (ref 3.9–4.9)
Alkaline Phosphatase: 128 IU/L — ABNORMAL HIGH (ref 41–116)
BUN/Creatinine Ratio: 6 — ABNORMAL LOW (ref 9–23)
BUN: 4 mg/dL — ABNORMAL LOW (ref 6–24)
Bilirubin Total: 0.3 mg/dL (ref 0.0–1.2)
CO2: 22 mmol/L (ref 20–29)
Calcium: 9.2 mg/dL (ref 8.7–10.2)
Chloride: 98 mmol/L (ref 96–106)
Creatinine, Ser: 0.7 mg/dL (ref 0.57–1.00)
Globulin, Total: 3.2 g/dL (ref 1.5–4.5)
Glucose: 265 mg/dL — ABNORMAL HIGH (ref 70–99)
Potassium: 3.7 mmol/L (ref 3.5–5.2)
Sodium: 137 mmol/L (ref 134–144)
Total Protein: 7 g/dL (ref 6.0–8.5)
eGFR: 107 mL/min/1.73 (ref >59)

## 2024-05-03 LAB — LIPID PANEL W/O CHOL/HDL RATIO
Cholesterol, Total: 213 mg/dL — ABNORMAL HIGH (ref 100–199)
HDL: 66 mg/dL (ref >39)
LDL Chol Calc (NIH): 121 mg/dL — ABNORMAL HIGH (ref 0–99)
Triglycerides: 151 mg/dL — ABNORMAL HIGH (ref 0–149)
VLDL Cholesterol Cal: 26 mg/dL (ref 5–40)

## 2024-05-03 LAB — TSH: TSH: 0.561 u[IU]/mL (ref 0.450–4.500)

## 2024-05-03 NOTE — Progress Notes (Signed)
 Care Guide Pharmacy Note  05/03/2024 Name: AMMIE WARRICK MRN: [id number] DOB: 1975/12/19  Referred By: Vicci Duwaine SQUIBB, DO Reason for referral: Complex Care Management (Outreach to schedule with Pharm d)   KELBY LOTSPEICH is a 48 y.o. year old female who is a primary care patient of Vicci Duwaine SQUIBB, DO.  Celene L Forbess was referred to the pharmacist for assistance related to: DMII  Successful contact was made with the patient to discuss pharmacy services including being ready for the pharmacist to call at least 5 minutes before the scheduled appointment time and to have medication bottles and any blood pressure readings ready for review. The patient agreed to meet with the pharmacist via telephone visit on (date/time).05/18/2024  Jeoffrey Buffalo , RMA     [place]  [facility], [facility] Guide  Direct Dial: [phone]  Website: [email or web address]

## 2024-05-04 ENCOUNTER — Ambulatory Visit: Payer: Self-pay | Admitting: Family Medicine

## 2024-05-04 NOTE — Telephone Encounter (Signed)
 Requested medications are due for refill today.  yes  Requested medications are on the active medications list.  yes  Last refill. 11/02/2023 #30 2 rf  Future visit scheduled.   yes  Notes to clinic.  Refill not delegated.    Requested Prescriptions  Pending Prescriptions Disp Refills   zolpidem  (AMBIEN ) 5 MG tablet [Pharmacy Med Name: Zolpidem  Tartrate 5 MG Oral Tablet] 30 tablet 0    Sig: TAKE 1 TABLET BY MOUTH AT BEDTIME AS NEEDED FOR SLEEP     Not Delegated - Psychiatry:  Anxiolytics/Hypnotics Failed - 05/04/2024  4:11 PM      Failed - This refill cannot be delegated      Failed - Urine Drug Screen completed in last 360 days      Passed - Valid encounter within last 6 months    Recent Outpatient Visits           2 days ago Type 2 diabetes mellitus with hyperglycemia, without long-term current use of insulin (HCC)   [place] [facility] [place], Megan P, DO   6 months ago Lichen sclerosus of vulva   [place] [facility] [place], Megan P, DO   7 months ago Type 2 diabetes mellitus with hyperglycemia, without long-term current use of insulin [facility])   [place] [facility], Megan P, DO   8 months ago BV (bacterial vaginosis)   [facility] [place], [place], DO

## 2024-05-06 ENCOUNTER — Encounter: Payer: Self-pay | Admitting: Family Medicine

## 2024-05-06 MED ORDER — ZOLPIDEM TARTRATE 5 MG PO TABS: 5.0000 mg | ORAL_TABLET | Freq: Every evening | ORAL | 2 refills | Status: DC | PRN

## 2024-05-06 NOTE — Assessment & Plan Note (Signed)
 Under good control on current regimen. Continue current regimen. Continue to monitor. Call with any concerns. Refills given. Labs drawn today.

## 2024-05-06 NOTE — Assessment & Plan Note (Signed)
Stable. Continue to monitor. Refills given today. Call with any concerns.

## 2024-05-06 NOTE — Assessment & Plan Note (Signed)
 Not doing well with A1c of 9.8. Will start her on ozempic  and recheck in about 6 weeks. Will get pharmacy involved. Call with any concerns.

## 2024-05-14 ENCOUNTER — Encounter: Payer: Self-pay | Admitting: Family Medicine

## 2024-05-18 ENCOUNTER — Other Ambulatory Visit: Payer: Self-pay

## 2024-05-18 DIAGNOSIS — E782 Mixed hyperlipidemia: Secondary | ICD-10-CM

## 2024-05-18 DIAGNOSIS — E1165 Type 2 diabetes mellitus with hyperglycemia: Secondary | ICD-10-CM

## 2024-05-18 MED ORDER — OMRON 3 SERIES BP MONITOR DEVI: 0 refills | Status: AC

## 2024-05-18 NOTE — Progress Notes (Signed)
 05/18/2024 Name: Maria Little MRN: [id number] DOB: 02-Jul-1976   Subjective: Telephone call initial visit for medication assistance.  Care Team: Primary Care Provider: Vicci Duwaine SQUIBB, DO ; Next Scheduled Visit: 06/20/2024  Medication Access/Adherence  Current Pharmacy:  [facility] [address] ([address]), [place] - [address] [place]) [place] [address] Phone: [phone] Fax: [phone]  -Patient reports affordability concerns with their medications: Yes  -Patient reports access/transportation concerns to their pharmacy: No  -Patient reports adherence concerns with their medications:  Yes    Diabetes Management -Current Medications: metformin  500 mg 2 tablets once daily, Ozempic  0.25mg  weekly -Patient got 0.25/0.5mg  sample Ozempic  pen from Dr. Ferdie office on 10/15 and she gave her first dose in office. She was told to take 4 doses of 0.25 mg and then take 2 0.5 mg doses. She states she takes the doses on Wednesdays.  -Patient is tolerating medication well. Patient has not taken blood sugar at home lately but says she has testing supplies and can begin monitoring levels to see how Ozempic  is working.  -Ozempic  prescription sent to [facility] but it is delayed because it is too early. Insurance will let it go through on November 5th. Plan to call them on November 5th to remind them to run prescription through so we can check on copay.  -Manufacturer copay card for Ozempic  can be given to them if it isn't automatically applied to help with affordability.  -Patient says she was taking metformin  2 tablets once a day but on 10/15 new prescription says twice a day. Counseled patient to take medication as prescribed.   Hyperlipidemia Assessment:  -Current Medications: rosuvastatin  5 mg daily -Doesn't take rosuvastatin  regularly. Encouraged patient to take daily to lower cholesterol.  -Lipid panel on 05/02/24 shows elevated cholesterol  (213), elevated triglycerides (151), and elevated LDL (121).   Hypertension Assessment:  -Patient does not take blood pressure at home but says she will get a monitor. If insurance does not cover it then we will provide one if not affordable OTC.  -Patient is not currently on antihypertensive treatment.  -Last BP at office visit on 05/02/24 was elevated at 134/91.   Objective: Lab Results  Component Value Date   HGBA1C 9.8 (H) 05/02/2024   Lab Results  Component Value Date   CREATININE 0.70 05/02/2024   BUN 4 (L) 05/02/2024   NA 137 05/02/2024   K 3.7 05/02/2024   CL 98 05/02/2024   CO2 22 05/02/2024   Lab Results  Component Value Date   CHOL 213 (H) 05/02/2024   HDL 66 05/02/2024   LDLCALC 121 (H) 05/02/2024   TRIG 151 (H) 05/02/2024   Assessment/Plan:   Diabetes Management Plan:  -Follow up with [facility] on 11/5 to check on copay and give manufacturer copay card information to them if it is not already applied.  -Encouraged patient to take home blood sugar levels.  -Patient will complete 1 more week at Ozempic  0.25mg , then increase to 0.5mg  weekly -Increase metformin  XR 500mg  to 2 tablets BID  Hyperlipidemia Plan:  -Encouraged patient to take rosuvastatin  daily -Recommend follow-up lipid and CMP at next PCP visit  Hypertension Plan:  -Sending prescription for BP monitor to pharmacy; if not covered, and patient cannot afford OTC, I can provide one -Recommend to check home BP regularly; if >130/80, consider addition of ACEi/ARB for BP control and cardiorenal protection  Follow Up Plan: 05/23/24  [name] [facility] PharmD Candidate  Class of 2026

## 2024-05-18 NOTE — Progress Notes (Deleted)
   05/18/2024 Name: Maria Little MRN: [id number] DOB: 01-07-1976  No chief complaint on file.   {Visit Type:26650}   Subjective:  Care Team: Primary Care Provider: Vicci Duwaine SQUIBB, DO ; Next Scheduled Visit: *** {careteamprovider:27366}  Medication Access/Adherence  Current Pharmacy:  [facility] [address] ([address]), [place] - [address] [place]) [place] [address] Phone: [phone] Fax: [phone]   Patient reports affordability concerns with their medications: {YES/NO:21197} Patient reports access/transportation concerns to their pharmacy: {YES/NO:21197} Patient reports adherence concerns with their medications:  {YES/NO:21197} ***   {Pharmacy S/O Choices:26420}   Objective:  Lab Results  Component Value Date   HGBA1C 9.8 (H) 05/02/2024    Lab Results  Component Value Date   CREATININE 0.70 05/02/2024   BUN 4 (L) 05/02/2024   NA 137 05/02/2024   K 3.7 05/02/2024   CL 98 05/02/2024   CO2 22 05/02/2024    Lab Results  Component Value Date   CHOL 213 (H) 05/02/2024   HDL 66 05/02/2024   LDLCALC 121 (H) 05/02/2024   TRIG 151 (H) 05/02/2024    Medications Reviewed Today   Medications were not reviewed in this encounter       Assessment/Plan:   {Pharmacy A/P Choices:26421}  Follow Up Plan: ***  ***

## 2024-05-22 NOTE — Progress Notes (Unsigned)
 05/24/19/25 Name: Maria Little MRN: [id number] DOB: [birth year]   Subjective: Telephone call initial visit for medication assistance.  Care Team: Primary Care Provider: Vicci Duwaine SQUIBB, DO ; Next Scheduled Visit: 06/20/2024  Medication Access/Adherence  Current Pharmacy:  [facility] [address] ([address]), [place] - [address] [place] ([address]) [place] [address] Phone: [phone] Fax: [phone]  -Patient reports affordability concerns with their medications: Yes  -Patient reports access/transportation concerns to their pharmacy: No  -Patient reports adherence concerns with their medications:  Yes    Diabetes Management -Current Medications: metformin  100 mg 2 tablets once daily, Ozempic  0.25mg  weekly -Patient got 0.25/0.5mg  sample Ozempic  pen from Dr. Ferdie office on 10/15 and she will take her 4th dose of 0.25mg  today.  She endorses tolerating the medication well. -She also endorses recently increasing metformin  to prescribed dose of 1000mg  BID -Patient has been monitoring home BG on occasion now and states numbers are <200- she states this is after work, and she typically has not eaten anything a few hours prior to checking -A1c 9.8% 1/15 -Does not endorse any hypoglycemia -Contacted [facility], and Ozempic  0.5mg  weekly would be $254/month on patient's insurance.  Per insurance, this is due to a total out of pocket amount of [phone] that must be met.  I gave the pharmacy a copay card that took the medication down to $154/month.  Hyperlipidemia Assessment:  -Current Medications: rosuvastatin  5 mg daily -Patient states she is now taking rosuvastatin  5mg  daily as prescribed -Lipid panel on 05/02/24 shows elevated cholesterol (213), elevated triglycerides (151), and elevated LDL (121).   Hypertension Assessment:  -Patient does not take blood pressure at home due to not having a monitor -Prescription sent to [facility] reflects that  insurance will not cover a home BP monitor, and patient is not able to afford one over the counter at this time -Patient is not currently on antihypertensive treatment.  -Last BP at office visit on 05/02/24 was elevated at 134/91.   Objective: Lab Results  Component Value Date   HGBA1C 9.8 (H) 05/02/2024   Lab Results  Component Value Date   CREATININE 0.70 05/02/2024   BUN 4 (L) 05/02/2024   NA 137 05/02/2024   K 3.7 05/02/2024   CL 98 05/02/2024   CO2 22 05/02/2024   Lab Results  Component Value Date   CHOL 213 (H) 05/02/2024   HDL 66 05/02/2024   LDLCALC 121 (H) 05/02/2024   TRIG 151 (H) 05/02/2024   Assessment/Plan:   Diabetes Management Plan:  -Patient will increase Ozempic  to 0.5mg  weekly next Wednesday, and the Ozempic  sample she currently have will last until 11/19 -She states the copay for Ozempic  of $154 is not affordable for her at this time, but she will try to budget to be able to afford starting in January.  I will see if the office can provide her with another 1-2 sample pens to get through until then, so she can at least continue 0.5mg  weekly until January -Patient sees Dr. Vicci again 12/3- if not able to get additional Ozempic , could consider Januvia (copay card would likely make this affordable), pioglitazone, or basal insulin -Continue metformin  1000mg  BID -Continue to monitor home BG daily  -Due for follow-up A1c 1/15; consider addition of SGLT2 if <9%, no ketones present in urine, and no PMH of frequent genitourinary infections (I can check on affordability/provide copay card)  Hyperlipidemia Plan:  -Continue rosuvastatin  5mg  daily at this time -Recommend follow-up lipid panel  and CMP at time of next A1c; if LDL still >70, consider increasing rosuvastatin  to 10mg  daily   Hypertension Plan:  -Leaving Omron 3 automated, upper arm BP monitor at [facility] for patient to pick up when she comes in to see Dr. Herold this afternoon since once is not affordable to  purchase OTC at this time -Recommend to check home BP regularly; if >130/80, consider addition of ACEi/ARB for BP control and cardiorenal protection  Follow Up Plan: 1/7  Channing DELENA Mealing, PharmD, DPLA

## 2024-05-23 ENCOUNTER — Ambulatory Visit: Admitting: Pediatrics

## 2024-05-23 ENCOUNTER — Other Ambulatory Visit: Payer: Self-pay

## 2024-05-23 ENCOUNTER — Encounter: Payer: Self-pay | Admitting: Pediatrics

## 2024-05-23 VITALS — BP 103/70 | HR 112 | Temp 98.9°F | Resp 17 | Ht 65.0 in | Wt 197.6 lb

## 2024-05-23 DIAGNOSIS — Z7985 Long-term (current) use of injectable non-insulin antidiabetic drugs: Secondary | ICD-10-CM

## 2024-05-23 DIAGNOSIS — E1165 Type 2 diabetes mellitus with hyperglycemia: Secondary | ICD-10-CM

## 2024-05-23 DIAGNOSIS — E782 Mixed hyperlipidemia: Secondary | ICD-10-CM

## 2024-05-23 DIAGNOSIS — G8929 Other chronic pain: Secondary | ICD-10-CM | POA: Diagnosis not present

## 2024-05-23 DIAGNOSIS — M25562 Pain in left knee: Secondary | ICD-10-CM

## 2024-05-23 NOTE — Patient Instructions (Signed)
 FMLA paperwork is being completed by Dr. Herold and is expected to be done within the next 5-7 business days following today's appointment. Will be faxed directly to [facility] once completed and it is your responsibility to follow up with them to ensure it has been received.

## 2024-05-23 NOTE — Progress Notes (Signed)
 Office Visit  BP 103/70 (BP Location: Left Arm, Patient Position: Sitting, Cuff Size: Large)   Pulse (!) 112   Temp 98.9 F (37.2 C) (Oral)   Resp 17   Ht 5' 5 (1.651 m)   Wt 197 lb 9.6 oz (89.6 kg)   LMP  (LMP Unknown)   SpO2 98%   BMI 32.88 kg/m    Subjective:    Patient ID: Maria Little, female    DOB: [birth year], 48 y.o.   MRN: [id number]  HPI: Maria Little is a 48 y.o. female  Chief Complaint  Patient presents with   Knee Pain    Originally hurt March 2025 but has continued to cause problems during that time. Needs FMLA in place for days she may miss due to pain.     Discussed the use of AI scribe software for clinical note transcription with the patient, who gave verbal consent to proceed.  History of Present Illness   Maria Little is a 48 year old female who presents for [facility] documentation and management of left knee pain.  She has been experiencing left knee pain since March 2025, which she manages with a knee brace, lidocaine , glucosamine, Tylenol, Aleve , and Advil . The pain flares up at least once a month, lasting two to three days, and has been persistent since Sunday. Despite the discomfort, she continues to work.  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 Her work schedule is from 4 AM to 1 PM, and she frequently works alone, handling multiple tasks.  She has not undergone physical therapy. Her knee pain began with an injury at work, but her worker's compensation claim was denied due to delayed reporting and documentation. She has kept records of her absences and medical visits related to the knee injury.  She is currently using Ozempic , with today being her last dose of 0.25 mg, and she is scheduled to start 0.5 mg on May 30, 2024.      Relevant past medical, surgical, family and social history reviewed and updated as indicated. Interim medical history since our  last visit reviewed. Allergies and medications reviewed and updated.  ROS per HPI unless specifically indicated above     Objective:    BP 103/70 (BP Location: Left Arm, Patient Position: Sitting, Cuff Size: Large)   Pulse (!) 112   Temp 98.9 F (37.2 C) (Oral)   Resp 17   Ht 5' 5 (1.651 m)   Wt 197 lb 9.6 oz (89.6 kg)   LMP  (LMP Unknown)   SpO2 98%   BMI 32.88 kg/m   Wt Readings from Last 3 Encounters:  05/23/24 197 lb 9.6 oz (89.6 kg)  05/02/24 190 lb 12.8 oz (86.5 kg)  11/02/23 200 lb 6.4 oz (90.9 kg)     Physical Exam Constitutional:      Appearance: Normal appearance.  Pulmonary:     Effort: Pulmonary effort is normal.  Musculoskeletal:        General: Normal range of motion.  Skin:    Comments: Normal skin color  Neurological:     General: No focal deficit present.     Mental Status: She is alert. Mental status is at baseline.  Psychiatric:        Mood and Affect: Mood normal.        Behavior: Behavior normal.        Thought Content: Thought content  normal.         05/23/2024    2:33 PM 05/02/2024    9:00 AM 11/02/2023    9:18 AM 10/06/2023    3:01 PM 09/05/2023   10:39 AM  Depression screen PHQ 2/9  Decreased Interest 0 0 0 0 0  Down, Depressed, Hopeless 0 0 0 0 1  PHQ - 2 Score 0 0 0 0 1  Altered sleeping 0 3 1 0 3  Tired, decreased energy 0 0 0 0 1  Change in appetite 0 1 0 0 1  Feeling bad or failure about yourself  0 0 0 0 0  Trouble concentrating 0 0 0 0 0  Moving slowly or fidgety/restless 0 0 0 0 0  Suicidal thoughts 0 0 0 0 0  PHQ-9 Score 0  4  1  0  6   Difficult doing work/chores  Not difficult at all Not difficult at all  Not difficult at all     Data saved with a previous flowsheet row definition       05/23/2024    2:33 PM 05/02/2024    9:00 AM 11/02/2023    9:18 AM 10/06/2023    3:01 PM  GAD 7 : Generalized Anxiety Score  Nervous, Anxious, on Edge 0 0 0 0  Control/stop worrying 0 0 0 0  Worry too much - different things 0 0  0 0  Trouble relaxing 0 0 0 0  Restless 0 0 0 0  Easily annoyed or irritable 0 0 0 0  Afraid - awful might happen 0 0 0 0  Total GAD 7 Score 0 0 0 0  Anxiety Difficulty   Not difficult at all        Assessment & Plan:  Assessment & Plan   Type 2 diabetes mellitus with hyperglycemia, without long-term current use of insulin (HCC) Assessment & Plan: Currently on Ozempic , transitioning from 0.25 mg to 0.5 mg dose. Blood pressure cuff provided for home monitoring. - Provide two samples of Ozempic  0.5 mg to cover upcoming doses. - Ensure she is aware of the transition to 0.5 mg dose starting November 12th.    Chronic pain of left knee Assessment & Plan: Chronic pain exacerbated by work, managed with brace and medications. No physical therapy pursued. Work-related injury with denied worker's compensation claim. - Complete FMLA paperwork indicating knee pain flares up once or twice a month, lasting two to three days. - Discuss potential use of diclofenac gel for additional pain relief. - Advise rest and elevation of the knee on days off to manage pain.     FMLA completed.  Follow up plan: Return if symptoms worsen or fail to improve.  Hadassah SHAUNNA Nett, MD

## 2024-05-29 ENCOUNTER — Encounter: Payer: Self-pay | Admitting: Pediatrics

## 2024-05-29 DIAGNOSIS — G8929 Other chronic pain: Secondary | ICD-10-CM | POA: Insufficient documentation

## 2024-05-29 NOTE — Assessment & Plan Note (Signed)
 Chronic pain exacerbated by work, managed with brace and medications. No physical therapy pursued. Work-related injury with denied worker's compensation claim. - Complete FMLA paperwork indicating knee pain flares up once or twice a month, lasting two to three days. - Discuss potential use of diclofenac gel for additional pain relief. - Advise rest and elevation of the knee on days off to manage pain.

## 2024-05-29 NOTE — Assessment & Plan Note (Signed)
 Currently on Ozempic , transitioning from 0.25 mg to 0.5 mg dose. Blood pressure cuff provided for home monitoring. - Provide two samples of Ozempic  0.5 mg to cover upcoming doses. - Ensure she is aware of the transition to 0.5 mg dose starting November 12th.

## 2024-06-20 ENCOUNTER — Ambulatory Visit: Admitting: Family Medicine

## 2024-06-20 ENCOUNTER — Encounter: Payer: Self-pay | Admitting: Family Medicine

## 2024-06-20 VITALS — BP 124/78 | HR 106 | Temp 98.0°F | Ht 65.0 in | Wt 195.6 lb

## 2024-06-20 DIAGNOSIS — R809 Proteinuria, unspecified: Secondary | ICD-10-CM | POA: Diagnosis not present

## 2024-06-20 DIAGNOSIS — Z7985 Long-term (current) use of injectable non-insulin antidiabetic drugs: Secondary | ICD-10-CM

## 2024-06-20 DIAGNOSIS — E1129 Type 2 diabetes mellitus with other diabetic kidney complication: Secondary | ICD-10-CM

## 2024-06-20 DIAGNOSIS — E1165 Type 2 diabetes mellitus with hyperglycemia: Secondary | ICD-10-CM | POA: Diagnosis not present

## 2024-06-20 LAB — BAYER DCA HB A1C WAIVED: HB A1C (BAYER DCA - WAIVED): 8.4 % — ABNORMAL HIGH (ref 4.8–5.6)

## 2024-06-20 MED ORDER — MELOXICAM 7.5 MG PO TABS: 7.5000 mg | ORAL_TABLET | Freq: Every day | ORAL | 0 refills | Status: AC

## 2024-06-20 MED ORDER — SEMAGLUTIDE (1 MG/DOSE) 4 MG/3ML ~~LOC~~ SOPN: 1.0000 mg | PEN_INJECTOR | SUBCUTANEOUS | 0 refills | Status: AC

## 2024-06-20 NOTE — Assessment & Plan Note (Signed)
 A1c significantly better at 8.4 down from 9.8. Encouraged patient to take her medicine as prescribed. Will continue titration of ozempic . Continue to work with pharmacy. Call with any concerns. Continue to monitor. Call with any concerns.

## 2024-06-20 NOTE — Progress Notes (Signed)
 BP 124/78   Pulse (!) 106   Temp 98 F (36.7 C)   Ht 5' 5 (1.651 m)   Wt 195 lb 9.6 oz (88.7 kg)   LMP  (LMP Unknown)   BMI 32.55 kg/m    Subjective:    Patient ID: Maria Little, female    DOB: 1976/02/25, 48 y.o.   MRN: [id number]  HPI: Maria Little is a 48 y.o. female  Chief Complaint  Patient presents with   Diabetes   DIABETES- has not been taking her metformin  in the PM. Has restarted her ozempic  at 0.25 and just went up to 0.5mg  2 weeks ago Hypoglycemic episodes:no Polydipsia/polyuria: no Visual disturbance: no Chest pain: no Paresthesias: no Glucose Monitoring: no Taking Insulin?: no Blood Pressure Monitoring: not checking Retinal Examination: Not up to Date Foot Exam: Up to Date Diabetic Education: Completed Pneumovax: Up to Date Influenza: Up to Date Aspirin: no  Relevant past medical, surgical, family and social history reviewed and updated as indicated. Interim medical history since our last visit reviewed. Allergies and medications reviewed and updated.  Review of Systems  Respiratory: Negative.    Cardiovascular: Negative.   Musculoskeletal: Negative.   Neurological: Negative.   Psychiatric/Behavioral: Negative.      Per HPI unless specifically indicated above     Objective:    BP 124/78   Pulse (!) 106   Temp 98 F (36.7 C)   Ht 5' 5 (1.651 m)   Wt 195 lb 9.6 oz (88.7 kg)   LMP  (LMP Unknown)   BMI 32.55 kg/m   Wt Readings from Last 3 Encounters:  06/20/24 195 lb 9.6 oz (88.7 kg)  05/23/24 197 lb 9.6 oz (89.6 kg)  05/02/24 190 lb 12.8 oz (86.5 kg)    Physical Exam Vitals and nursing note reviewed.  Constitutional:      General: She is not in acute distress.    Appearance: Normal appearance. She is obese. She is not ill-appearing, toxic-appearing or diaphoretic.  HENT:     Head: Normocephalic and atraumatic.     Right Ear: External ear normal.     Left Ear: External ear normal.     Nose: Nose normal.      Mouth/Throat:     Mouth: Mucous membranes are moist.     Pharynx: Oropharynx is clear.  Eyes:     General: No scleral icterus.       Right eye: No discharge.        Left eye: No discharge.     Extraocular Movements: Extraocular movements intact.     Conjunctiva/sclera: Conjunctivae normal.     Pupils: Pupils are equal, round, and reactive to light.  Cardiovascular:     Rate and Rhythm: Normal rate and regular rhythm.     Pulses: Normal pulses.     Heart sounds: Normal heart sounds. No murmur heard.    No friction rub. No gallop.  Pulmonary:     Effort: Pulmonary effort is normal. No respiratory distress.     Breath sounds: Normal breath sounds. No stridor. No wheezing, rhonchi or rales.  Chest:     Chest wall: No tenderness.  Musculoskeletal:        General: Normal range of motion.     Cervical back: Normal range of motion and neck supple.  Skin:    General: Skin is warm and dry.     Capillary Refill: Capillary refill takes less than 2 seconds.     Coloration: Skin  is not jaundiced or pale.     Findings: No bruising, erythema, lesion or rash.  Neurological:     General: No focal deficit present.     Mental Status: She is alert and oriented to person, place, and time. Mental status is at baseline.  Psychiatric:        Mood and Affect: Mood normal.        Behavior: Behavior normal.        Thought Content: Thought content normal.        Judgment: Judgment normal.     Results for orders placed or performed in visit on 05/02/24  Bayer DCA Hb A1c Waived   Collection Time: 05/02/24  9:03 AM  Result Value Ref Range   HB A1C (BAYER DCA - WAIVED) 9.8 (H) 4.8 - 5.6 %  Microalbumin, Urine Waived   Collection Time: 05/02/24  9:03 AM  Result Value Ref Range   Microalb, Ur Waived 150 (H) 0 - 19 mg/L   Creatinine, Urine Waived 200 10 - 300 mg/dL   Microalb/Creat Ratio 30-300 (H) <30 mg/g  CBC with Differential/Platelet   Collection Time: 05/02/24  9:04 AM  Result Value Ref Range    WBC 6.5 3.4 - 10.8 x10E3/uL   RBC 4.80 3.77 - 5.28 x10E6/uL   Hemoglobin 11.8 11.1 - 15.9 g/dL   Hematocrit 58.9 65.9 - 46.6 %   MCV 85 79 - 97 fL   MCH 24.6 (L) 26.6 - 33.0 pg   MCHC 28.8 (L) 31.5 - 35.7 g/dL   RDW 86.3 88.2 - 84.5 %   Platelets 440 150 - 450 x10E3/uL   Neutrophils 53 Not Estab. %   Lymphs 37 Not Estab. %   Monocytes 8 Not Estab. %   Eos 1 Not Estab. %   Basos 1 Not Estab. %   Neutrophils Absolute 3.4 1.4 - 7.0 x10E3/uL   Lymphocytes Absolute 2.4 0.7 - 3.1 x10E3/uL   Monocytes Absolute 0.5 0.1 - 0.9 x10E3/uL   EOS (ABSOLUTE) 0.1 0.0 - 0.4 x10E3/uL   Basophils Absolute 0.0 0.0 - 0.2 x10E3/uL   Immature Granulocytes 0 Not Estab. %   Immature Grans (Abs) 0.0 0.0 - 0.1 x10E3/uL  Comprehensive metabolic panel with GFR   Collection Time: 05/02/24  9:04 AM  Result Value Ref Range   Glucose 265 (H) 70 - 99 mg/dL   BUN 4 (L) 6 - 24 mg/dL   Creatinine, Ser 9.29 0.57 - 1.00 mg/dL   eGFR 892 >40 fO/fpw/8.26   BUN/Creatinine Ratio 6 (L) 9 - 23   Sodium 137 134 - 144 mmol/L   Potassium 3.7 3.5 - 5.2 mmol/L   Chloride 98 96 - 106 mmol/L   CO2 22 20 - 29 mmol/L   Calcium  9.2 8.7 - 10.2 mg/dL   Total Protein 7.0 6.0 - 8.5 g/dL   Albumin 3.8 (L) 3.9 - 4.9 g/dL   Globulin, Total 3.2 1.5 - 4.5 g/dL   Bilirubin Total 0.3 0.0 - 1.2 mg/dL   Alkaline Phosphatase 128 (H) 41 - 116 IU/L   AST 25 0 - 40 IU/L   ALT 15 0 - 32 IU/L  Lipid Panel w/o Chol/HDL Ratio   Collection Time: 05/02/24  9:04 AM  Result Value Ref Range   Cholesterol, Total 213 (H) 100 - 199 mg/dL   Triglycerides 848 (H) 0 - 149 mg/dL   HDL 66 >60 mg/dL   VLDL Cholesterol Cal 26 5 - 40 mg/dL   LDL Chol Calc (NIH)  121 (H) 0 - 99 mg/dL  TSH   Collection Time: 05/02/24  9:04 AM  Result Value Ref Range   TSH 0.561 0.450 - 4.500 uIU/mL      Assessment & Plan:   Problem List Items Addressed This Visit       Endocrine   Type 2 diabetes mellitus with microalbuminuria, without long-term current use of  insulin (HCC) - Primary   A1c significantly better at 8.4 down from 9.8. Encouraged patient to take her medicine as prescribed. Will continue titration of ozempic . Continue to work with pharmacy. Call with any concerns. Continue to monitor. Call with any concerns.       Relevant Medications   Semaglutide , 1 MG/DOSE, 4 MG/3ML SOPN   Other Relevant Orders   Bayer DCA Hb A1c Waived     Follow up plan: Return in about 5 weeks (around 07/25/2024).

## 2024-06-20 NOTE — Progress Notes (Signed)
   06/20/2024 Name: Maria Little MRN: [id number] DOB: [birth year]  Maria Little is a 48 y.o. year old female who was referred for medication management by their primary care provider, Vicci, Megan P, DO. They presented for a face to face visit today.   They were referred to the pharmacist by their PCP for assistance in managing diabetes   Subjective:  Care Team: Primary Care Provider: Vicci Duwaine SQUIBB, DO ; Next Scheduled Visit: 1/14  Medication Access/Adherence  Current Pharmacy:  [facility] [address] ([address]), [place] - [address] [place]) [place] [address] Phone: [phone] Fax: [phone]  Patient reports affordability concerns with their medications: Yes  Patient reports access/transportation concerns to their pharmacy: No  Patient reports adherence concerns with their medications:  Yes    Diabetes: Current medications: Ozempic  0.5mg  weekly, metformin  XR 1000mg  BID -Patient recently provided with Ozempic  samples from PCP office.  She had titrated to 0.5mg  weekly, but went back to 0.25mg  weekly when she started the 2nd pen -Endorses usually just taking 1000mg  daily due to forgetting second dose  Macrovascular and Microvascular Risk Reduction:  Statin? yes (rosuvastatin  5mg  daily); ACEi/ARB? No Last urinary albumin/creatinine ratio:  Lab Results  Component Value Date   MICRALBCREAT 30-300 (H) 05/02/2024   MICRALBCREAT 30-300 (H) 10/29/2022   MICRALBCREAT <30 05/25/2021   MICRALBCREAT <30 10/15/2020   Last eye exam:  Lab Results  Component Value Date   HMDIABEYEEXA No Retinopathy 08/12/2021   Last foot exam: 06/20/2024 Tobacco Use:  Tobacco Use: Low Risk  (06/20/2024)   Patient History    Smoking Tobacco Use: Never    Smokeless Tobacco Use: Never    Passive Exposure: Not on file   Objective:  Lab Results  Component Value Date   HGBA1C 8.4 (H) 06/20/2024   Lab Results  Component Value Date    CREATININE 0.70 05/02/2024   BUN 4 (L) 05/02/2024   NA 137 05/02/2024   K 3.7 05/02/2024   CL 98 05/02/2024   CO2 22 05/02/2024   Lab Results  Component Value Date   CHOL 213 (H) 05/02/2024   HDL 66 05/02/2024   LDLCALC 121 (H) 05/02/2024   TRIG 151 (H) 05/02/2024   Assessment/Plan:   Diabetes: -Currently uncontrolled; goal A1c <7%. Cardiorenal risk reduction is opportunities for improvement.. Blood pressure is at goal <130/80. LDL is not at goal.  -Due for Ozempic  dose today and will resume 0.5mg  weekly dosing.  Provided patient with an additional sample pen due to cost to refill of $150/month.  Patient plans to budget so she can afford Ozempic  starting in February and has enough sample medication to last until then using 0.5mg  weekly -Goal is to titrate Ozempic  to 2mg  weekly over time and be able to stop metformin  -Advised patient to begin taking second dose of metformin  1000mg  after work (approximately 9 hours after 1st dose), and she believes she can remember to do this at least until we can get Ozempic  titrated up -Would benefit from SGLT2 based on A1c and UACR, but this is likely expensive on her plan, also.  Consider addition of low dose ACEi/ARB -Due for lipid panel, CMP, and UACR at next PCP visit; if LDL still >70, consider increasing rosuvastatin  to 10-20mg  daily  Follow Up Plan: 1/14  Channing DELENA Mealing, PharmD, DPLA

## 2024-07-25 ENCOUNTER — Other Ambulatory Visit

## 2024-07-25 ENCOUNTER — Ambulatory Visit

## 2024-07-25 DIAGNOSIS — Z3042 Encounter for surveillance of injectable contraceptive: Secondary | ICD-10-CM | POA: Diagnosis not present

## 2024-07-25 MED ORDER — MEDROXYPROGESTERONE ACETATE 150 MG/ML IM SUSP
150.0000 mg | INTRAMUSCULAR | Status: AC
  Administered 2024-07-25: 150 mg via INTRAMUSCULAR

## 2024-07-25 NOTE — Progress Notes (Signed)
 Patient is in office today for a nurse visit for Birth Control Injection. Patient Injection was given in the  Right deltoid. Patient tolerated injection well.

## 2024-07-29 ENCOUNTER — Encounter: Payer: Self-pay | Admitting: Family Medicine

## 2024-08-01 ENCOUNTER — Ambulatory Visit: Admitting: Family Medicine

## 2024-08-01 ENCOUNTER — Encounter: Payer: Self-pay | Admitting: Family Medicine

## 2024-08-01 ENCOUNTER — Other Ambulatory Visit: Payer: Self-pay

## 2024-08-01 VITALS — BP 120/81 | HR 109 | Temp 98.4°F | Ht 65.0 in | Wt 189.4 lb

## 2024-08-01 DIAGNOSIS — M25562 Pain in left knee: Secondary | ICD-10-CM | POA: Diagnosis not present

## 2024-08-01 DIAGNOSIS — G8929 Other chronic pain: Secondary | ICD-10-CM | POA: Diagnosis not present

## 2024-08-01 DIAGNOSIS — Z7985 Long-term (current) use of injectable non-insulin antidiabetic drugs: Secondary | ICD-10-CM | POA: Diagnosis not present

## 2024-08-01 DIAGNOSIS — E1129 Type 2 diabetes mellitus with other diabetic kidney complication: Secondary | ICD-10-CM | POA: Diagnosis not present

## 2024-08-01 DIAGNOSIS — F5104 Psychophysiologic insomnia: Secondary | ICD-10-CM

## 2024-08-01 DIAGNOSIS — R809 Proteinuria, unspecified: Secondary | ICD-10-CM | POA: Diagnosis not present

## 2024-08-01 MED ORDER — ARIPIPRAZOLE 10 MG PO TABS: 10.0000 mg | ORAL_TABLET | Freq: Every day | ORAL | 0 refills | Status: AC

## 2024-08-01 MED ORDER — ROSUVASTATIN CALCIUM 5 MG PO TABS: 5.0000 mg | ORAL_TABLET | Freq: Every day | ORAL | 0 refills | Status: AC

## 2024-08-01 MED ORDER — ZOLPIDEM TARTRATE 5 MG PO TABS: 5.0000 mg | ORAL_TABLET | Freq: Every evening | ORAL | 2 refills | Status: AC | PRN

## 2024-08-01 NOTE — Assessment & Plan Note (Signed)
 Not doing well. Will increase her abilify  to 10mg  and recheck in 3 months. Call with any concerns.

## 2024-08-01 NOTE — Assessment & Plan Note (Signed)
 Tolerating her ozempic  well. Still on 0.5mg . Discussed increasing to 1mg . Continue to monitor. Call with any concerns.

## 2024-08-01 NOTE — Progress Notes (Unsigned)
" ° °  08/01/2024 Name: Maria Little MRN: [id number] DOB: 1975-12-19  Unsuccessful outreach attempt for scheduled telephone follow-up visit.  I tried to call x2, and both went directly to patient's voicemail.  HIPAA compliant message left with my direct phone number.  I am also sending a MyChart message to attempt to reschedule visit.  Channing DELENA Mealing, PharmD, DPLA    "

## 2024-08-01 NOTE — Assessment & Plan Note (Signed)
 Note for work given today. Call with any concerns.

## 2024-08-01 NOTE — Progress Notes (Signed)
 "  BP 120/81   Pulse (!) 109   Temp 98.4 F (36.9 C) (Oral)   Ht 5' 5 (1.651 m)   Wt 189 lb 6.4 oz (85.9 kg)   SpO2 99%   BMI 31.52 kg/m    Subjective:    Patient ID: Maria Little Rake, female    DOB: [birth year], 49 y.o.   MRN: [id number]  HPI: Maria Little is a 49 y.o. female  Chief Complaint  Patient presents with   Diabetes    Ozempic  is going well.    DIABETES Hypoglycemic episodes:no Polydipsia/polyuria: no Visual disturbance: no Chest pain: no Paresthesias: no Glucose Monitoring: yes  Accucheck frequency: Daily  Fasting glucose: 158-162  Post prandial: 233 Taking Insulin?: no Blood Pressure Monitoring: not checking Retinal Examination: Not up to Date Foot Exam: Up to Date Diabetic Education: Completed Pneumovax: Up to Date Influenza: Up to Date Aspirin: no  INSOMNIA Duration: chronic Satisfied with sleep quality: no Difficulty falling asleep: yes Difficulty staying asleep: no Waking a few hours after sleep onset: yes Early morning awakenings: no Daytime hypersomnolence: no Wakes feeling refreshed: no Good sleep hygiene: no Apnea: no Snoring: no Depressed/anxious mood: yes Recent stress: yes Restless legs/nocturnal leg cramps: no Chronic pain/arthritis: yes History of sleep study: yes Treatments attempted: melatonin, uinsom, benadryl , and ambien     Relevant past medical, surgical, family and social history reviewed and updated as indicated. Interim medical history since our last visit reviewed. Allergies and medications reviewed and updated.  Review of Systems  Constitutional: Negative.   Respiratory: Negative.    Cardiovascular: Negative.   Gastrointestinal: Negative.   Musculoskeletal:  Positive for arthralgias. Negative for back pain, gait problem, joint swelling, myalgias, neck pain and neck stiffness.  Neurological: Negative.   Psychiatric/Behavioral:  Positive for sleep disturbance. Negative for agitation, behavioral problems,  confusion, decreased concentration, dysphoric mood, hallucinations, self-injury and suicidal ideas. The patient is not nervous/anxious and is not hyperactive.     Per HPI unless specifically indicated above     Objective:    BP 120/81   Pulse (!) 109   Temp 98.4 F (36.9 C) (Oral)   Ht 5' 5 (1.651 m)   Wt 189 lb 6.4 oz (85.9 kg)   SpO2 99%   BMI 31.52 kg/m   Wt Readings from Last 3 Encounters:  08/01/24 189 lb 6.4 oz (85.9 kg)  06/20/24 195 lb 9.6 oz (88.7 kg)  05/23/24 197 lb 9.6 oz (89.6 kg)    Physical Exam Vitals and nursing note reviewed.  Constitutional:      General: She is not in acute distress.    Appearance: Normal appearance. She is not ill-appearing, toxic-appearing or diaphoretic.  HENT:     Head: Normocephalic and atraumatic.     Right Ear: External ear normal.     Left Ear: External ear normal.     Nose: Nose normal.     Mouth/Throat:     Mouth: Mucous membranes are moist.     Pharynx: Oropharynx is clear.  Eyes:     General: No scleral icterus.       Right eye: No discharge.        Left eye: No discharge.     Extraocular Movements: Extraocular movements intact.     Conjunctiva/sclera: Conjunctivae normal.     Pupils: Pupils are equal, round, and reactive to light.  Cardiovascular:     Rate and Rhythm: Normal rate and regular rhythm.     Pulses: Normal pulses.  Heart sounds: Normal heart sounds. No murmur heard.    No friction rub. No gallop.  Pulmonary:     Effort: Pulmonary effort is normal. No respiratory distress.     Breath sounds: Normal breath sounds. No stridor. No wheezing, rhonchi or rales.  Chest:     Chest wall: No tenderness.  Musculoskeletal:        General: Normal range of motion.     Cervical back: Normal range of motion and neck supple.  Skin:    General: Skin is warm and dry.     Capillary Refill: Capillary refill takes less than 2 seconds.     Coloration: Skin is not jaundiced or pale.     Findings: No bruising,  erythema, lesion or rash.  Neurological:     General: No focal deficit present.     Mental Status: She is alert and oriented to person, place, and time. Mental status is at baseline.  Psychiatric:        Mood and Affect: Mood normal.        Behavior: Behavior normal.        Thought Content: Thought content normal.        Judgment: Judgment normal.     Results for orders placed or performed in visit on 06/20/24  Bayer DCA Hb A1c Waived   Collection Time: 06/20/24 10:56 AM  Result Value Ref Range   HB A1C (BAYER DCA - WAIVED) 8.4 (H) 4.8 - 5.6 %      Assessment & Plan:   Problem List Items Addressed This Visit       Endocrine   Type 2 diabetes mellitus with microalbuminuria, without long-term current use of insulin (HCC)   Tolerating her ozempic  well. Still on 0.5mg . Discussed increasing to 1mg . Continue to monitor. Call with any concerns.       Relevant Medications   rosuvastatin  (CRESTOR ) 5 MG tablet     Other   Psychophysiological insomnia   Not doing well. Will increase her abilify  to 10mg  and recheck in 3 months. Call with any concerns.       Chronic pain of left knee - Primary   Note for work given today. Call with any concerns.         Follow up plan: Return in about 3 months (around 10/30/2024) for physical.      "

## 2024-10-10 ENCOUNTER — Ambulatory Visit

## 2024-10-10 ENCOUNTER — Ambulatory Visit: Admitting: Family Medicine
